# Patient Record
Sex: Female | Born: 1985 | Race: Black or African American | Hispanic: No | Marital: Single | Smoking: Current every day smoker
Health system: Southern US, Community
[De-identification: ages and names within clinical notes are randomized; demographics above are authoritative.]

## PROBLEM LIST (undated history)

## (undated) ENCOUNTER — Inpatient Hospital Stay (HOSPITAL_COMMUNITY): Payer: Self-pay

## (undated) DIAGNOSIS — R51 Headache: Secondary | ICD-10-CM

## (undated) DIAGNOSIS — D649 Anemia, unspecified: Secondary | ICD-10-CM

## (undated) DIAGNOSIS — N390 Urinary tract infection, site not specified: Secondary | ICD-10-CM

## (undated) HISTORY — PX: THERAPEUTIC ABORTION: SHX798

---
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
## 2007-03-21 ENCOUNTER — Encounter: Payer: Self-pay | Admitting: Obstetrics and Gynecology

## 2007-05-08 ENCOUNTER — Observation Stay: Payer: Self-pay

## 2007-05-11 ENCOUNTER — Observation Stay: Payer: Self-pay

## 2007-05-18 ENCOUNTER — Inpatient Hospital Stay: Payer: Self-pay | Admitting: Unknown Physician Specialty

## 2007-06-15 ENCOUNTER — Emergency Department: Payer: Self-pay | Admitting: Internal Medicine

## 2008-01-07 ENCOUNTER — Emergency Department (HOSPITAL_COMMUNITY): Admission: EM | Admit: 2008-01-07 | Discharge: 2008-01-07 | Payer: Self-pay | Admitting: Emergency Medicine

## 2008-02-16 ENCOUNTER — Inpatient Hospital Stay (HOSPITAL_COMMUNITY): Admission: AD | Admit: 2008-02-16 | Discharge: 2008-02-16 | Payer: Self-pay | Admitting: Gynecology

## 2008-05-26 ENCOUNTER — Inpatient Hospital Stay (HOSPITAL_COMMUNITY): Admission: AD | Admit: 2008-05-26 | Discharge: 2008-05-28 | Payer: Self-pay | Admitting: Obstetrics

## 2008-05-29 ENCOUNTER — Inpatient Hospital Stay (HOSPITAL_COMMUNITY): Admission: AD | Admit: 2008-05-29 | Discharge: 2008-05-29 | Payer: Self-pay | Admitting: Obstetrics

## 2008-06-22 ENCOUNTER — Inpatient Hospital Stay (HOSPITAL_COMMUNITY): Admission: AD | Admit: 2008-06-22 | Discharge: 2008-06-26 | Payer: Self-pay | Admitting: Obstetrics

## 2009-02-24 ENCOUNTER — Emergency Department (HOSPITAL_COMMUNITY): Admission: EM | Admit: 2009-02-24 | Discharge: 2009-02-24 | Payer: Self-pay | Admitting: Emergency Medicine

## 2009-09-10 ENCOUNTER — Emergency Department (HOSPITAL_COMMUNITY): Admission: EM | Admit: 2009-09-10 | Discharge: 2009-09-11 | Payer: Self-pay | Admitting: Emergency Medicine

## 2009-09-10 ENCOUNTER — Emergency Department: Payer: Self-pay | Admitting: Emergency Medicine

## 2009-09-16 ENCOUNTER — Emergency Department: Payer: Self-pay | Admitting: Emergency Medicine

## 2009-12-07 ENCOUNTER — Emergency Department (HOSPITAL_COMMUNITY): Admission: EM | Admit: 2009-12-07 | Discharge: 2009-12-07 | Payer: Self-pay | Admitting: Family Medicine

## 2010-06-05 ENCOUNTER — Inpatient Hospital Stay (HOSPITAL_COMMUNITY)
Admission: AD | Admit: 2010-06-05 | Discharge: 2010-06-06 | Payer: Self-pay | Source: Home / Self Care | Admitting: Obstetrics

## 2010-06-28 ENCOUNTER — Inpatient Hospital Stay (HOSPITAL_COMMUNITY)
Admission: AD | Admit: 2010-06-28 | Discharge: 2010-06-30 | Payer: Self-pay | Source: Home / Self Care | Attending: Obstetrics | Admitting: Obstetrics

## 2010-07-08 ENCOUNTER — Inpatient Hospital Stay (HOSPITAL_COMMUNITY)
Admission: AD | Admit: 2010-07-08 | Discharge: 2010-07-10 | Payer: Self-pay | Source: Home / Self Care | Attending: Obstetrics | Admitting: Obstetrics

## 2010-09-08 ENCOUNTER — Emergency Department (HOSPITAL_COMMUNITY): Payer: Medicaid Other

## 2010-09-08 ENCOUNTER — Emergency Department (HOSPITAL_COMMUNITY)
Admission: EM | Admit: 2010-09-08 | Discharge: 2010-09-08 | Disposition: A | Discharge status: Home / Self Care | Payer: Medicaid Other | Attending: Emergency Medicine | Admitting: Emergency Medicine

## 2010-09-08 DIAGNOSIS — R1031 Right lower quadrant pain: Secondary | ICD-10-CM | POA: Insufficient documentation

## 2010-09-08 DIAGNOSIS — N949 Unspecified condition associated with female genital organs and menstrual cycle: Secondary | ICD-10-CM | POA: Insufficient documentation

## 2010-09-08 DIAGNOSIS — R369 Urethral discharge, unspecified: Secondary | ICD-10-CM | POA: Insufficient documentation

## 2010-09-08 DIAGNOSIS — N760 Acute vaginitis: Secondary | ICD-10-CM | POA: Insufficient documentation

## 2010-09-08 DIAGNOSIS — A499 Bacterial infection, unspecified: Secondary | ICD-10-CM | POA: Insufficient documentation

## 2010-09-08 DIAGNOSIS — B9689 Other specified bacterial agents as the cause of diseases classified elsewhere: Secondary | ICD-10-CM | POA: Insufficient documentation

## 2010-09-08 DIAGNOSIS — M549 Dorsalgia, unspecified: Secondary | ICD-10-CM | POA: Insufficient documentation

## 2010-09-08 LAB — URINALYSIS, ROUTINE W REFLEX MICROSCOPIC
Bilirubin Urine: NEGATIVE
Ketones, ur: NEGATIVE mg/dL
Nitrite: NEGATIVE
Urobilinogen, UA: 0.2 mg/dL (ref 0.0–1.0)
pH: 6 (ref 5.0–8.0)

## 2010-09-08 LAB — URINALYSIS, MICROSCOPIC ONLY
Protein, ur: NEGATIVE mg/dL
Specific Gravity, Urine: 1.024 (ref 1.005–1.030)
Urine Glucose, Fasting: NEGATIVE mg/dL
pH: 6 (ref 5.0–8.0)

## 2010-09-08 LAB — COMPREHENSIVE METABOLIC PANEL
ALT: 15 U/L (ref 0–35)
BUN: 9 mg/dL (ref 6–23)
CO2: 24 mEq/L (ref 19–32)
Calcium: 9.5 mg/dL (ref 8.4–10.5)
Chloride: 103 mEq/L (ref 96–112)
GFR calc Af Amer: >60 mL/min (ref >60)
Glucose, Bld: 106 mg/dL — ABNORMAL HIGH (ref 70–99)
Potassium: 3.8 mEq/L (ref 3.5–5.1)

## 2010-09-08 LAB — CBC
MCH: 28.8 pg (ref 26.0–34.0)
RBC: 3.72 MIL/uL — ABNORMAL LOW (ref 3.87–5.11)
RDW: 13.6 % (ref 11.5–15.5)
WBC: 7.3 10*3/uL (ref 4.0–10.5)

## 2010-09-08 LAB — LACTIC ACID, PLASMA: Lactic Acid, Venous: 0.7 mmol/L (ref 0.5–2.2)

## 2010-09-08 LAB — DIFFERENTIAL
Lymphocytes Relative: 27 % (ref 12–46)
Lymphs Abs: 2 10*3/uL (ref 0.7–4.0)
Monocytes Absolute: 0.6 10*3/uL (ref 0.1–1.0)
Monocytes Relative: 9 % (ref 3–12)

## 2010-09-08 LAB — POCT PREGNANCY, URINE: Preg Test, Ur: NEGATIVE

## 2010-09-08 LAB — WET PREP, GENITAL: Yeast Wet Prep HPF POC: NONE SEEN

## 2010-09-11 LAB — URINE CULTURE
Colony Count: >=100000
Culture  Setup Time: 201203012358

## 2010-09-19 LAB — URINALYSIS, ROUTINE W REFLEX MICROSCOPIC
Bilirubin Urine: NEGATIVE
Glucose, UA: NEGATIVE mg/dL
Hgb urine dipstick: NEGATIVE
Protein, ur: NEGATIVE mg/dL
Specific Gravity, Urine: <1.005 — ABNORMAL LOW (ref 1.005–1.030)
pH: 7 (ref 5.0–8.0)

## 2010-09-19 LAB — URINE MICROSCOPIC-ADD ON

## 2010-09-19 LAB — CBC
HCT: 29.6 % — ABNORMAL LOW (ref 36.0–46.0)
Hemoglobin: 8.4 g/dL — ABNORMAL LOW (ref 12.0–15.0)
MCH: 30.4 pg (ref 26.0–34.0)
MCH: 30.5 pg (ref 26.0–34.0)
MCHC: 33.7 g/dL (ref 30.0–36.0)
MCHC: 34.5 g/dL (ref 30.0–36.0)
Platelets: 392 10*3/uL (ref 150–400)
RBC: 2.75 MIL/uL — ABNORMAL LOW (ref 3.87–5.11)

## 2010-09-20 LAB — URINE CULTURE
Colony Count: NO GROWTH
Culture  Setup Time: 201111281055
Culture: NO GROWTH

## 2010-09-20 LAB — URINALYSIS, ROUTINE W REFLEX MICROSCOPIC
Bilirubin Urine: NEGATIVE
Glucose, UA: NEGATIVE mg/dL
Specific Gravity, Urine: 1.01 (ref 1.005–1.030)
pH: 7 (ref 5.0–8.0)

## 2010-09-20 LAB — GC/CHLAMYDIA PROBE AMP, GENITAL: GC Probe Amp, Genital: NEGATIVE

## 2010-09-20 LAB — URINE MICROSCOPIC-ADD ON

## 2010-09-20 LAB — WET PREP, GENITAL: Trich, Wet Prep: NONE SEEN

## 2010-10-02 LAB — POCT I-STAT, CHEM 8
Calcium, Ion: 1.2 mmol/L (ref 1.12–1.32)
Chloride: 106 mEq/L (ref 96–112)
HCT: 28 % — ABNORMAL LOW (ref 36.0–46.0)
TCO2: 24 mmol/L (ref 0–100)

## 2010-10-02 LAB — URINALYSIS, ROUTINE W REFLEX MICROSCOPIC
Glucose, UA: NEGATIVE mg/dL
Protein, ur: 30 mg/dL — AB

## 2010-10-02 LAB — POCT PREGNANCY, URINE: Preg Test, Ur: NEGATIVE

## 2010-10-02 LAB — URINE MICROSCOPIC-ADD ON

## 2010-10-15 LAB — URINALYSIS, ROUTINE W REFLEX MICROSCOPIC
Bilirubin Urine: NEGATIVE
Ketones, ur: NEGATIVE mg/dL
Nitrite: NEGATIVE
Protein, ur: NEGATIVE mg/dL
Specific Gravity, Urine: 1.025 (ref 1.005–1.030)
Urobilinogen, UA: 0.2 mg/dL (ref 0.0–1.0)

## 2010-11-24 ENCOUNTER — Inpatient Hospital Stay (INDEPENDENT_AMBULATORY_CARE_PROVIDER_SITE_OTHER)
Admission: RE | Admit: 2010-11-24 | Discharge: 2010-11-24 | Disposition: A | Discharge status: Home / Self Care | Payer: Medicaid Other | Source: Ambulatory Visit | Attending: Family Medicine | Admitting: Family Medicine

## 2010-11-24 DIAGNOSIS — N390 Urinary tract infection, site not specified: Secondary | ICD-10-CM

## 2010-11-24 LAB — POCT URINALYSIS DIP (DEVICE)
Nitrite: POSITIVE — AB
Protein, ur: >=300 mg/dL — AB
Urobilinogen, UA: 1 mg/dL (ref 0.0–1.0)
pH: 5.5 (ref 5.0–8.0)

## 2010-11-25 LAB — URINE CULTURE
Colony Count: NO GROWTH
Culture: NO GROWTH

## 2011-04-06 LAB — CBC
HCT: 30.4 — ABNORMAL LOW
MCHC: 35.8
Platelets: 315
RDW: 12.9

## 2011-04-06 LAB — DIFFERENTIAL
Basophils Absolute: 0
Basophils Relative: 1
Eosinophils Absolute: 0.2
Eosinophils Relative: 3
Lymphocytes Relative: 17
Monocytes Absolute: 0.3

## 2011-04-06 LAB — WET PREP, GENITAL: Yeast Wet Prep HPF POC: NONE SEEN

## 2011-04-06 LAB — URINALYSIS, ROUTINE W REFLEX MICROSCOPIC
Bilirubin Urine: NEGATIVE
Glucose, UA: NEGATIVE
Ketones, ur: NEGATIVE
Specific Gravity, Urine: 1.028
pH: 7.5

## 2011-04-06 LAB — HCG, QUANTITATIVE, PREGNANCY: hCG, Beta Chain, Quant, S: 35208 — ABNORMAL HIGH

## 2011-04-06 LAB — POCT I-STAT, CHEM 8
Calcium, Ion: 1.16
Chloride: 105
Glucose, Bld: 95
HCT: 31 — ABNORMAL LOW
Hemoglobin: 10.5 — ABNORMAL LOW
TCO2: 21

## 2011-04-06 LAB — URINE MICROSCOPIC-ADD ON

## 2011-04-06 LAB — ABO/RH: ABO/RH(D): O POS

## 2011-04-07 LAB — URINALYSIS, ROUTINE W REFLEX MICROSCOPIC
Bilirubin Urine: NEGATIVE
Glucose, UA: NEGATIVE
Specific Gravity, Urine: 1.025
Urobilinogen, UA: 0.2
pH: 6.5

## 2011-04-07 LAB — RPR: RPR Ser Ql: NONREACTIVE

## 2011-04-07 LAB — URINE CULTURE

## 2011-04-07 LAB — CBC
Hemoglobin: 11.3 — ABNORMAL LOW
MCHC: 34.4
Platelets: 329
RDW: 12.5

## 2011-04-07 LAB — DIFFERENTIAL
Basophils Absolute: 0
Basophils Relative: 1
Monocytes Absolute: 0.9
Neutro Abs: 6.7

## 2011-04-07 LAB — URINE MICROSCOPIC-ADD ON

## 2011-04-07 LAB — WET PREP, GENITAL: Yeast Wet Prep HPF POC: NONE SEEN

## 2011-04-07 LAB — GC/CHLAMYDIA PROBE AMP, GENITAL: GC Probe Amp, Genital: NEGATIVE

## 2011-04-11 LAB — URINALYSIS, ROUTINE W REFLEX MICROSCOPIC
Glucose, UA: NEGATIVE
Hgb urine dipstick: NEGATIVE
Protein, ur: NEGATIVE
Specific Gravity, Urine: <1.005 — ABNORMAL LOW

## 2011-04-11 LAB — CBC
RBC: 2.85 — ABNORMAL LOW
WBC: 15 — ABNORMAL HIGH

## 2011-04-11 LAB — FETAL FIBRONECTIN: Fetal Fibronectin: NEGATIVE

## 2011-04-11 LAB — URINE MICROSCOPIC-ADD ON

## 2011-04-14 LAB — COMPREHENSIVE METABOLIC PANEL
Albumin: 3 g/dL — ABNORMAL LOW (ref 3.5–5.2)
Alkaline Phosphatase: 127 U/L — ABNORMAL HIGH (ref 39–117)
BUN: 5 mg/dL — ABNORMAL LOW (ref 6–23)
Chloride: 104 mEq/L (ref 96–112)
Glucose, Bld: 85 mg/dL (ref 70–99)
Potassium: 3.4 mEq/L — ABNORMAL LOW (ref 3.5–5.1)
Total Bilirubin: 0.7 mg/dL (ref 0.3–1.2)

## 2011-04-14 LAB — CBC
HCT: 29.2 % — ABNORMAL LOW (ref 36.0–46.0)
HCT: 31.5 % — ABNORMAL LOW (ref 36.0–46.0)
Hemoglobin: 10 g/dL — ABNORMAL LOW (ref 12.0–15.0)
Hemoglobin: 10.7 g/dL — ABNORMAL LOW (ref 12.0–15.0)
MCV: 100.1 fL — ABNORMAL HIGH (ref 78.0–100.0)
Platelets: 270 10*3/uL (ref 150–400)
WBC: 13.7 10*3/uL — ABNORMAL HIGH (ref 4.0–10.5)
WBC: 17 10*3/uL — ABNORMAL HIGH (ref 4.0–10.5)

## 2011-04-14 LAB — URINE MICROSCOPIC-ADD ON

## 2011-04-14 LAB — URINALYSIS, ROUTINE W REFLEX MICROSCOPIC
Hgb urine dipstick: NEGATIVE
Ketones, ur: 15 mg/dL — AB
Protein, ur: NEGATIVE mg/dL
Urobilinogen, UA: 0.2 mg/dL (ref 0.0–1.0)

## 2011-07-11 NOTE — L&D Delivery Note (Signed)
I agree with the above note regarding delivery.   Patient seen approximately 2 hrs after delivery on Postpartum Floor for passage of clots.  Estimated 500 cc clots expelled. Bimanual exam shows no residual clots of significance, and uterus firm at u-2.  Oxytocin 20  Units in 1000 cc LR administered with no further bleeding.  CBC in AM ordered

## 2011-07-11 NOTE — L&D Delivery Note (Signed)
Delivery Note At 3:22 PM a viable female was delivered via Vaginal, Spontaneous Delivery (Presentation: ROA).  APGAR: 9, 10; weight 6 lb 6.8 oz (2914 g).   Placenta status: spontaneous delivery, intact.  Cord: 3 vessels with the following complications: None.  Cord pH: not collected Wynelle Bourgeois CNM was present and helped with the delivery  Anesthesia: Epidural  Episiotomy: None Lacerations: None Est. Blood Loss (mL): 100  Mom to postpartum.  Baby to nursery-stable.  Corky Downs 06/14/2012, 3:42 PM

## 2011-11-29 ENCOUNTER — Inpatient Hospital Stay (HOSPITAL_COMMUNITY): Payer: Self-pay

## 2011-11-29 ENCOUNTER — Inpatient Hospital Stay (HOSPITAL_COMMUNITY)
Admission: AD | Admit: 2011-11-29 | Discharge: 2011-11-29 | Disposition: A | Discharge status: Home / Self Care | Payer: Self-pay | Source: Ambulatory Visit | Attending: Obstetrics & Gynecology | Admitting: Obstetrics & Gynecology

## 2011-11-29 ENCOUNTER — Encounter (HOSPITAL_COMMUNITY): Payer: Self-pay | Admitting: *Deleted

## 2011-11-29 DIAGNOSIS — N939 Abnormal uterine and vaginal bleeding, unspecified: Secondary | ICD-10-CM

## 2011-11-29 DIAGNOSIS — N898 Other specified noninflammatory disorders of vagina: Secondary | ICD-10-CM

## 2011-11-29 DIAGNOSIS — R1031 Right lower quadrant pain: Secondary | ICD-10-CM | POA: Insufficient documentation

## 2011-11-29 DIAGNOSIS — O2390 Unspecified genitourinary tract infection in pregnancy, unspecified trimester: Secondary | ICD-10-CM | POA: Insufficient documentation

## 2011-11-29 DIAGNOSIS — N390 Urinary tract infection, site not specified: Secondary | ICD-10-CM | POA: Insufficient documentation

## 2011-11-29 DIAGNOSIS — O209 Hemorrhage in early pregnancy, unspecified: Secondary | ICD-10-CM | POA: Insufficient documentation

## 2011-11-29 LAB — URINALYSIS, ROUTINE W REFLEX MICROSCOPIC
Bilirubin Urine: NEGATIVE
Glucose, UA: NEGATIVE mg/dL
Specific Gravity, Urine: 1.025 (ref 1.005–1.030)
Urobilinogen, UA: 0.2 mg/dL (ref 0.0–1.0)
pH: 6 (ref 5.0–8.0)

## 2011-11-29 LAB — WET PREP, GENITAL: Yeast Wet Prep HPF POC: NONE SEEN

## 2011-11-29 LAB — URINE MICROSCOPIC-ADD ON

## 2011-11-29 LAB — POCT PREGNANCY, URINE: Preg Test, Ur: POSITIVE — AB

## 2011-11-29 MED ORDER — CEPHALEXIN 500 MG PO CAPS: 500.0000 mg | ORAL_CAPSULE | Freq: Four times a day (QID) | ORAL | Status: AC

## 2011-11-29 MED ORDER — CEPHALEXIN 500 MG PO CAPS: 500.0000 mg | ORAL_CAPSULE | Freq: Four times a day (QID) | ORAL | Status: DC

## 2011-11-29 NOTE — MAU Note (Signed)
Pt in c/o abdominal cramping x4 days.  States this morning she had some pink-tinged spotting with wiping x1 episode.  LMP 09/22/11.

## 2011-11-29 NOTE — MAU Provider Note (Signed)
History     CSN: [id number]  Arrival date and time: 11/29/11 4098   First Provider Initiated Contact with Patient 11/29/11 0920      Chief Complaint  Patient presents with  . Possible Pregnancy   HPI Comments: Patient  Is  26 y/o [id number] AAF c/o RLQ pain with pink discharge on the toilet tissue this AM.Onset of pain was 5 days ago and she noticed d/c this AM. Pain is Described as sharp in natures that occasionally radiates to her buttocks or vagina. Worse with ambulation. Resolves with lying flat.  LMP 09/22/11. Last BM was 5 days ago. Little to no water consumption daily- mostly drinks soda.   Abdominal Cramping The pain is located in the RLQ. Associated symptoms include headaches, nausea and vomiting. Pertinent negatives include no diarrhea, fever or melena.  Patient is a 26 y.o. female presenting with cramps.  Abdominal Cramping The primary symptoms of the illness include abdominal pain, nausea and vomiting. The primary symptoms of the illness do not include fever or diarrhea.  Symptoms associated with the illness do not include chills or heartburn.    OB History    Grav Para Term Preterm Abortions TAB SAB Ect Mult Living   6 4 2 2 1 1  0 0 0 4      Past Medical History  Diagnosis Date  . No pertinent past medical history     Past Surgical History  Procedure Date  . No past surgeries     History reviewed. No pertinent family history.  History  Substance Use Topics  . Smoking status: Never Smoker   . Smokeless tobacco: Not on file  . Alcohol Use: No    Allergies: No Known Allergies  No prescriptions prior to admission    Review of Systems  Constitutional: Negative for fever and chills.  HENT:       1 week ago. Took Excedrin that made her nauseous. Resolved.  Respiratory: Negative.   Cardiovascular: Negative.   Gastrointestinal: Positive for nausea, vomiting and abdominal pain. Negative for heartburn, diarrhea, blood in stool and melena.  Genitourinary:  Negative.   Musculoskeletal: Negative.   Neurological: Positive for headaches.   Physical Exam   Blood pressure 122/68, pulse 86, temperature 98.4 F (36.9 C), temperature source Oral, resp. rate 16, height 5\' 6"  (1.676 m), weight 64.592 kg (142 lb 6.4 oz), last menstrual period 09/22/2011, SpO2 100.00%.  Physical Exam  Constitutional: She is oriented to person, place, and time. She appears well-developed and well-nourished. No distress.  HENT:  Head: Normocephalic and atraumatic.  Cardiovascular: Normal rate, regular rhythm, S1 normal, S2 normal, normal heart sounds and intact distal pulses.  Exam reveals no gallop and no friction rub.   No murmur heard. Respiratory: Effort normal and breath sounds normal. No respiratory distress. She has no wheezes. She has no rales. She exhibits no tenderness.  GI: Soft. Normal appearance and bowel sounds are normal. She exhibits distension and mass. There is no hepatosplenomegaly, splenomegaly or hepatomegaly. There is no tenderness. There is no rigidity, no rebound, no guarding, no tenderness at McBurney's point and negative Murphy's sign. No hernia. Hernia confirmed negative in the ventral area.       D/t pregnancy FHT of 160bpm via doppler  Genitourinary: Vagina normal and uterus normal. There is no rash, tenderness, lesion or injury on the right labia. There is no rash, tenderness, lesion or injury on the left labia. No vaginal discharge found.       Cervix closed.  No tenderness on bimanual exam.  Neurological: She is alert and oriented to person, place, and time.  Skin: Skin is warm and dry. No rash noted. She is not diaphoretic. No erythema. No pallor.    MAU Course  Procedures Complete abdominal US  Pelvic exam  FHT   MDM   UTI  Normal 1st trimester spotting Hematuria  Contractions secondary to dehydration  STI      Assessment and Plan  Awaiting [place]  Reassured patient with FHT via doppler.  Encouraged to drink more clear  fluids Keflex 500mg  QID  #28  for UTI  Use as directed Regular strength tylenol for pain   Assessment: Subchorionic Hemorrhage UTI   I agree with the exam and documentation.  Nolene Bernheim, RN, FNP  Kathreen Cornfield, Jared 11/29/2011, 9:36 AM

## 2011-11-29 NOTE — Discharge Instructions (Signed)
Urinary Tract Infection in Pregnancy A urinary tract infection (UTI) is a bacterial infection of the urinary tract. Infection of the urinary tract can include the ureters, kidneys (pyelonephritis), bladder (cystitis), and urethra (urethritis). All pregnant women should be screened for bacteria in the urinary tract. Identifying and treating a UTI will decrease the risk of preterm labor and developing more serious infections in both the mother and baby. CAUSES Bacteria germs cause almost all UTIs. There are many factors that can increase your chances of getting a UTI during pregnancy. These include:  Having a short urethra.   Poor toilet and hygiene habits.   Sexual intercourse.   Blockage of urine along the urinary tract.   Problems with the pelvic muscles or nerves.   Diabetes.   Obesity.   Bladder problems after having several children.   Previous history of UTI.  SYMPTOMS   Pain, burning, or a stinging feeling when urinating.   Suddenly feeling the need to urinate right away (urgency).   Loss of bladder control (urinary incontinence).   Frequent urination, more than is common with pregnancy.   Lower abdominal or back discomfort.   Bad smelling urine.   Cloudy urine.   Blood in the urine (hematuria).   Fever.  When the kidneys are infected, the symptoms may be:  Back pain.   Flank pain on the right side more so than the left.   Fever.   Chills.   Nausea.   Vomiting.  DIAGNOSIS   Urine tests.   Additional tests and procedures may include:   Ultrasound of the kidneys, ureters, bladder, and urethra.   Looking in the bladder with a lighted tube (cystoscopy).   Certain X-ray studies only when absolutely necessary.  Finding out the results of your test Ask when your test results will be ready. Make sure you get your test results. TREATMENT  Antibiotic medicine by mouth.   Antibiotics given through the vein (intravenously), if needed.  HOME CARE  INSTRUCTIONS   Take your antibiotics as directed. Finish them even if you start to feel better. Only take medicine as directed by your caregiver.   Drink enough fluids to keep your urine clear or pale yellow.   Do not have sexual intercourse until the infection is gone and your caregiver says it is okay.   Make sure you are tested for UTIs throughout your pregnancy if you get one. These infections often come back.  Preventing a UTI in the future:  Practice good toilet habits. Always wipe from front to back. Use the tissue only once.   Do not hold your urine. Empty your bladder as soon as possible when the urge comes.   Do not douche or use deodorant sprays.   Wash with soap and warm water around the genital area and the anus.   Empty your bladder before and after sexual intercourse.   Wear underwear with a cotton crotch.   Avoid caffeine and carbonated drinks. They can irritate the bladder.   Drink cranberry juice or take cranberry pills. This may decrease the risk of getting a UTI.   Do not drink alcohol.   Keep all your appointments and tests as scheduled.  SEEK MEDICAL CARE IF:   Your symptoms get worse.   You are still having fevers 2 or more days after treatment begins.   You develop a rash.   You feel that you are having problems with medicines prescribed.   You develop abnormal vaginal discharge.  SEEK IMMEDIATE MEDICAL   CARE IF:   You develop back or flank pain.   You develop chills.   You have blood in your urine.   You develop nausea and vomiting.   You develop contractions of your uterus.   You have a gush of fluid from the vagina.  MAKE SURE YOU:   Understand these instructions.   Will watch your condition.   Will get help right away if you are not doing well or get worse.  Document Released: 10/21/2010 Document Revised: 06/15/2011 Document Reviewed: 10/21/2010   Document Released: 04/23/2009 Document Revised: 06/15/2011 Document Reviewed:  04/23/2009 [facility] Patient Information 2012 [place], [place].Vaginal Bleeding During Pregnancy, First Trimester A small amount of bleeding (spotting) is relatively common in early pregnancy. It usually stops on its own. There are many causes for bleeding or spotting in early pregnancy. Some bleeding may be related to the pregnancy and some may not. Cramping with the bleeding is more serious and concerning. Tell your caregiver if you have any vaginal bleeding.  CAUSES  It is normal in most cases.  The pregnancy ends (miscarriage).  The pregnancy may end (threatened miscarriage).  Infection or inflammation of the cervix.  Growths (polyps) on the cervix.  Pregnancy happens outside of the uterus and in a fallopian tube (tubal pregnancy).  Many tiny cysts in the uterus instead of pregnancy tissue (molar pregnancy).  SYMPTOMS  Vaginal bleeding or spotting with or without cramps. DIAGNOSIS  To evaluate the pregnancy, your caregiver may: Do a pelvic exam.  Take blood tests.  Do an ultrasound.  It is very important to follow your caregiver's instructions.  TREATMENT  Evaluation of the pregnancy with blood tests and ultrasound.  Bed rest (getting up to use the bathroom only).  Rho-gam immunization if the mother is Rh negative and the father is Rh positive.  HOME CARE INSTRUCTIONS  If your caregiver orders bed rest, you may need to make arrangements for the care of other children and for other responsibilities. However, your caregiver may allow you to continue light activity.  Keep track of the number of pads you use each day, how often you change pads and how soaked (saturated) they are. Write this down.  Do not use tampons. Do not douche.  Do not have sexual intercourse or orgasms until approved by your physician.  Save any tissue that you pass for your caregiver to see.  Take medicine for cramps only with your caregiver's permission.  Do not take aspirin because it can make you bleed.  SEEK  IMMEDIATE MEDICAL CARE IF:  You experience severe cramps in your stomach, back or belly (abdomen).  You have an oral temperature above 102 F (38.9 C), not controlled by medicine.  You pass large clots or tissue.  Your bleeding increases or you become light-headed, weak or have fainting episodes.  You develop chills.  You are leaking or have a gush of fluid from your vagina.  You pass out while having a bowel movement. That may mean you have a ruptured tubal pregnancy.  Document Released: 04/05/2005 Document Revised: 06/15/2011 Document Reviewed: 10/15/2008 [facility] Patient Information 2012 [place], [place].

## 2011-11-29 NOTE — MAU Note (Signed)
Patient states she had a positive pregnancy test at home bout 3 weeks ago. Had pinkish/red spotting on tissue this morning after urinating. Not wearing a pad and no free bleeding. States she has had cramping for about 4 days.

## 2011-11-30 LAB — GC/CHLAMYDIA PROBE AMP, GENITAL: Chlamydia, DNA Probe: NEGATIVE

## 2012-03-01 ENCOUNTER — Inpatient Hospital Stay (HOSPITAL_COMMUNITY): Payer: Medicaid Other

## 2012-03-01 ENCOUNTER — Encounter (HOSPITAL_COMMUNITY): Payer: Self-pay | Admitting: *Deleted

## 2012-03-01 ENCOUNTER — Inpatient Hospital Stay (HOSPITAL_COMMUNITY)
Admission: AD | Admit: 2012-03-01 | Discharge: 2012-03-01 | Disposition: A | Discharge status: Home / Self Care | Payer: Medicaid Other | Source: Ambulatory Visit | Attending: Obstetrics & Gynecology | Admitting: Obstetrics & Gynecology

## 2012-03-01 DIAGNOSIS — O0930 Supervision of pregnancy with insufficient antenatal care, unspecified trimester: Secondary | ICD-10-CM | POA: Insufficient documentation

## 2012-03-01 DIAGNOSIS — O209 Hemorrhage in early pregnancy, unspecified: Secondary | ICD-10-CM | POA: Insufficient documentation

## 2012-03-01 DIAGNOSIS — Z3689 Encounter for other specified antenatal screening: Secondary | ICD-10-CM

## 2012-03-01 DIAGNOSIS — O26879 Cervical shortening, unspecified trimester: Secondary | ICD-10-CM | POA: Insufficient documentation

## 2012-03-01 LAB — URINALYSIS, ROUTINE W REFLEX MICROSCOPIC
Glucose, UA: NEGATIVE mg/dL
Nitrite: NEGATIVE
Specific Gravity, Urine: 1.01 (ref 1.005–1.030)
pH: 6.5 (ref 5.0–8.0)

## 2012-03-01 LAB — WET PREP, GENITAL
Clue Cells Wet Prep HPF POC: NONE SEEN
Trich, Wet Prep: NONE SEEN

## 2012-03-01 LAB — URINE MICROSCOPIC-ADD ON

## 2012-03-01 MED ORDER — HYDROXYPROGESTERONE CAPROATE 250 MG/ML IM OIL
250.0000 mg | TOPICAL_OIL | Freq: Once | INTRAMUSCULAR | Status: AC
  Administered 2012-03-01: 250 mg via INTRAMUSCULAR
  Filled 2012-03-01: qty 1

## 2012-03-01 NOTE — MAU Provider Note (Signed)
History   Tonya David is a 26 y.o. [phone] female at [redacted]w[redacted]d by LMP that correlates well w/ 1st trimester u/s, presenting with report of sharp shooting intermittent suprapubic pain that began 3-4 days ago, also intermittent pink spotting when wiping x 3 weeks.  Had 'big gush' of bright red blood 1.5 months ago after coughing.  H/o many uti's per pt w/ last being 3 months ago, but doesn't feel like a uti to her.  Has not initiated pnc, awaiting medicaid card then will be seeing dr. Gaynell Face.  Reports good fm. Denies uc's or lof.  Had u/s on 11/29/11 at 9wks 5days that showed SIUP w/ small subchorionic hemorrhage.  H/O PTB x 3 at 34-36 weeks, requiring magnesium during last 2 pregnancies. Has never received 17-P.    CSN: [id number]  Arrival date and time: 03/01/12 1017   First Provider Initiated Contact with Patient 03/01/12 1106      Chief Complaint  Patient presents with  . Abdominal Pain   Abdominal Pain This is a new problem. The current episode started in the past 7 days. The onset quality is gradual. The problem occurs intermittently. The problem has been unchanged. The pain is located in the suprapubic region. The pain is at a severity of 6/10. The pain is moderate. The quality of the pain is sharp. The abdominal pain does not radiate. Associated symptoms include frequency and headaches (occasional ). Pertinent negatives include no diarrhea, dysuria, fever, hematuria, nausea or vomiting. Nothing aggravates the pain. The pain is relieved by nothing. She has tried nothing for the symptoms. h/o many uti's, last one app 3 months ago    OB History    Grav Para Term Preterm Abortions TAB SAB Ect Mult Living   6 4 2 2 1 1  0 0 0 4      Past Medical History  Diagnosis Date  . No pertinent past medical history     Past Surgical History  Procedure Date  . No past surgeries     History reviewed. No pertinent family history.  History  Substance Use Topics  . Smoking status: Never  Smoker   . Smokeless tobacco: Not on file  . Alcohol Use: No    Allergies: No Known Allergies  No prescriptions prior to admission    Review of Systems  Constitutional: Negative.  Negative for fever.  Eyes: Negative.   Respiratory: Positive for cough, shortness of breath (currently has uri per pt) and wheezing. Negative for sputum production.        URI x 2 days- Took children's tylenol and mucinex without relief   Cardiovascular: Negative.   Gastrointestinal: Positive for abdominal pain. Negative for nausea, vomiting and diarrhea.  Genitourinary: Positive for frequency. Negative for dysuria, urgency and hematuria.       Pink spotting intermittently when wiping x 2 weeks; Had 'big gush' of bright red vaginal blood after coughing app 1.5 months ago Increased vag d/c w/o odor  Musculoskeletal: Negative.   Skin: Negative.   Neurological: Positive for headaches (occasional ). Negative for dizziness.  Endo/Heme/Allergies: Negative.   Psychiatric/Behavioral: Negative.    Physical Exam   Blood pressure 120/62, pulse 101, temperature 97.8 F (36.6 C), temperature source Oral, resp. rate 16, height 5' 5.5" (1.664 m), weight 67.699 kg (149 lb 4 oz), last menstrual period 09/22/2011.  Physical Exam  Constitutional: She is oriented to person, place, and time. She appears well-developed and well-nourished.  HENT:  Head: Normocephalic.  Eyes: Pupils are equal,  round, and reactive to light.  Neck: Normal range of motion.  Cardiovascular: Normal rate.   Respiratory: Effort normal and breath sounds normal.  GI: Soft. Bowel sounds are normal. She exhibits no distension. There is no tenderness.  Genitourinary: Vaginal discharge found.       Spec exam: cervix visually closed, no vag bleeding or lof noted.  Mod/large amount white homogenous non-odorous d/c noted.    SVE: 1/60/-2, somewhat firm   Musculoskeletal: Normal range of motion.  Neurological: She is alert and oriented to person,  place, and time. She has normal reflexes.  Skin: Skin is warm and dry.  Psychiatric: She has a normal mood and affect. Her behavior is normal. Judgment and thought content normal.   FHR: 156 via doppler UCs: occasional mild uc's, not perceived by pt  MAU Course  Procedures UA Limited u/s to assess placenta: no previa or abruption, cx=2.1cm w/ minimal funneling Spec exam w/ wet prep, GC/CT, and GBS collected SVE: 1/60/-2, somewhat firm  Results for orders placed during the hospital encounter of 03/01/12 (from the past 24 hour(s))  URINALYSIS, ROUTINE W REFLEX MICROSCOPIC     Status: Abnormal   Collection Time   03/01/12 10:45 AM      Component Value Range   Color, Urine YELLOW  YELLOW   APPearance HAZY (*) CLEAR   Specific Gravity, Urine 1.010  1.005 - 1.030   pH 6.5  5.0 - 8.0   Glucose, UA NEGATIVE  NEGATIVE mg/dL   Hgb urine dipstick NEGATIVE  NEGATIVE   Bilirubin Urine NEGATIVE  NEGATIVE   Ketones, ur NEGATIVE  NEGATIVE mg/dL   Protein, ur NEGATIVE  NEGATIVE mg/dL   Urobilinogen, UA 0.2  0.0 - 1.0 mg/dL   Nitrite NEGATIVE  NEGATIVE   Leukocytes, UA TRACE (*) NEGATIVE  URINE MICROSCOPIC-ADD ON     Status: Abnormal   Collection Time   03/01/12 10:45 AM      Component Value Range   Squamous Epithelial / LPF FEW (*) RARE   WBC, UA 3-6  <3 WBC/hpf   RBC / HPF 0-2  <3 RBC/hpf   Bacteria, UA MANY (*) RARE  WET PREP, GENITAL     Status: Abnormal   Collection Time   03/01/12  1:36 PM      Component Value Range   Yeast Wet Prep HPF POC NONE SEEN  NONE SEEN   Trich, Wet Prep NONE SEEN  NONE SEEN   Clue Cells Wet Prep HPF POC NONE SEEN  NONE SEEN   WBC, Wet Prep HPF POC FEW (*) NONE SEEN     There are no discharge medications for this patient.   Follow-up Information    Follow up with [facility]. (They will call you with an appointment.  Return to hospital  as needed or  if symptoms worsen)    Contact information:   [address] [place]  [place] [address]          1355: Reviewed case w/ Dr. Erin Fulling, to d/c and f/u in [facility] next week, repeat u/s to assess cervical length on Monday or Tuesday, and initiate 17-P today.   Assessment and Plan  A:  [redacted]w[redacted]d SIUP  No PNC  2nd trimester bleeding with abdominal pain  Shortened cervix @ 2.1cm w/ minimal funneling  H/O PTB x 3 @ 34-36wks  P:  D/C home  [facility] to call pt w/ appt for next week to initiate care  Anatomy u/s & repeat CL  on Mon 8/26 @ 3pm  17-P weekly, first injection today  Pelvic rest    Cheral Marker, CNM, WHNP-BC 03/01/2012, 11:07 AM

## 2012-03-01 NOTE — MAU Note (Signed)
Lower abdominal pain that started 4days ago.  Pt describes pain an intermittent sharp pain.  She also c/o some pink discharge noted when wiping after voiding, that started two weeks ago.

## 2012-03-01 NOTE — MAU Note (Signed)
Pt states intermittent spotting x2 weeks, none present now, however notes lower abdominal pain that began 3 days ago. Denies abnormal vaginal discharge. Denies uti s/s.

## 2012-03-01 NOTE — MAU Provider Note (Signed)
Attestation of Attending Supervision of Advanced Practitioner (CNM/NP): Evaluation and management procedures were performed by the Advanced Practitioner under my supervision and collaboration.  I have reviewed the Advanced Practitioner's note and chart, and have discussed the pts care with them in detail and I agree with the management and plan.  HARRAWAY-SMITH, Loukas Antonson 3:35 PM

## 2012-03-03 LAB — URINE CULTURE: Colony Count: 85000

## 2012-03-04 ENCOUNTER — Other Ambulatory Visit (HOSPITAL_COMMUNITY): Payer: Self-pay | Admitting: Physician Assistant

## 2012-03-04 ENCOUNTER — Encounter: Payer: Self-pay | Admitting: *Deleted

## 2012-03-04 ENCOUNTER — Ambulatory Visit (HOSPITAL_COMMUNITY)
Admission: RE | Admit: 2012-03-04 | Discharge: 2012-03-04 | Disposition: A | Discharge status: Home / Self Care | Payer: Medicaid Other | Source: Ambulatory Visit | Attending: Physician Assistant | Admitting: Physician Assistant

## 2012-03-04 DIAGNOSIS — Z8751 Personal history of pre-term labor: Secondary | ICD-10-CM | POA: Insufficient documentation

## 2012-03-04 DIAGNOSIS — Z1389 Encounter for screening for other disorder: Secondary | ICD-10-CM | POA: Insufficient documentation

## 2012-03-04 DIAGNOSIS — Z3689 Encounter for other specified antenatal screening: Secondary | ICD-10-CM

## 2012-03-04 DIAGNOSIS — Z363 Encounter for antenatal screening for malformations: Secondary | ICD-10-CM | POA: Insufficient documentation

## 2012-03-04 DIAGNOSIS — O358XX0 Maternal care for other (suspected) fetal abnormality and damage, not applicable or unspecified: Secondary | ICD-10-CM | POA: Insufficient documentation

## 2012-03-14 ENCOUNTER — Encounter: Payer: Self-pay | Admitting: Obstetrics and Gynecology

## 2012-03-14 ENCOUNTER — Ambulatory Visit (INDEPENDENT_AMBULATORY_CARE_PROVIDER_SITE_OTHER): Payer: Medicaid Other | Admitting: Obstetrics and Gynecology

## 2012-03-14 ENCOUNTER — Other Ambulatory Visit (HOSPITAL_COMMUNITY)
Admission: RE | Admit: 2012-03-14 | Discharge: 2012-03-14 | Disposition: A | Discharge status: Home / Self Care | Payer: Medicaid Other | Source: Ambulatory Visit | Attending: Obstetrics and Gynecology | Admitting: Obstetrics and Gynecology

## 2012-03-14 VITALS — BP 110/65 | Temp 98.3°F | Wt 150.9 lb

## 2012-03-14 DIAGNOSIS — Z8751 Personal history of pre-term labor: Secondary | ICD-10-CM | POA: Insufficient documentation

## 2012-03-14 DIAGNOSIS — O09899 Supervision of other high risk pregnancies, unspecified trimester: Secondary | ICD-10-CM

## 2012-03-14 DIAGNOSIS — Z113 Encounter for screening for infections with a predominantly sexual mode of transmission: Secondary | ICD-10-CM | POA: Insufficient documentation

## 2012-03-14 DIAGNOSIS — O09219 Supervision of pregnancy with history of pre-term labor, unspecified trimester: Secondary | ICD-10-CM

## 2012-03-14 DIAGNOSIS — O26879 Cervical shortening, unspecified trimester: Secondary | ICD-10-CM

## 2012-03-14 DIAGNOSIS — Z01419 Encounter for gynecological examination (general) (routine) without abnormal findings: Secondary | ICD-10-CM | POA: Insufficient documentation

## 2012-03-14 DIAGNOSIS — O0992 Supervision of high risk pregnancy, unspecified, second trimester: Secondary | ICD-10-CM

## 2012-03-14 LAB — POCT URINALYSIS DIP (DEVICE)
Bilirubin Urine: NEGATIVE
Glucose, UA: NEGATIVE mg/dL
Hgb urine dipstick: NEGATIVE
Ketones, ur: NEGATIVE mg/dL
Nitrite: NEGATIVE
Specific Gravity, Urine: 1.02 (ref 1.005–1.030)
pH: 7 (ref 5.0–8.0)

## 2012-03-14 MED ORDER — HYDROXYPROGESTERONE CAPROATE 250 MG/ML IM OIL
250.0000 mg | TOPICAL_OIL | INTRAMUSCULAR | Status: DC
  Administered 2012-03-14: 250 mg via INTRAMUSCULAR

## 2012-03-14 NOTE — Progress Notes (Signed)
   Subjective:    DANEL STUDZINSKI is a [id number] [redacted]w[redacted]d being seen today for her first obstetrical visit.  Her obstetrical history is significant for PTB x3 34-36 wks. Has started weekly 17P. Has short cx with funneling. Patient does not intend to breast feed. Pregnancy history fully reviewed.  Patient reports no complaints.  Filed Vitals:   03/14/12 0949  BP: 110/65  Temp: 98.3 F (36.8 C)  Weight: 150 lb 14.4 oz (68.448 kg)    HISTORY: OB History    Grav Para Term Preterm Abortions TAB SAB Ect Mult Living   6 4 1 3 1 1  0 0 0 4     # Outc Date GA Lbr Len/2nd Wgt Sex Del Anes PTL Lv   1 TRM 2007 [redacted]w[redacted]d   M SVD  No Yes   2 PRE 2008 [redacted]w[redacted]d   M SVD  Yes Yes   3 PRE 2009 [redacted]w[redacted]d   F SVD  Yes Yes   4 PRE 2011 [redacted]w[redacted]d   F SVD  Yes Yes   5 TAB            6 CUR              Past Medical History  Diagnosis Date  . No pertinent past medical history    Past Surgical History  Procedure Date  . No past surgeries    History reviewed. No pertinent family history.   Exam    Uterus:   S=D  Pelvic Exam:    Perineum: Normal Perineum   Vulva: normal   Vagina:  normal mucosa, normal discharge       Cervix: soft post ft/50/-3   Adnexa: not evaluated   Bony Pelvis: gynecoid  System: Breast:  normal appearance, no masses or tenderness   Skin: normal coloration and turgor, no rashes    Neurologic: oriented, normal mood, no focal deficits   Extremities: normal strength, tone, and muscle mass   HEENT PERRLA thyroid not enlarged   Mouth/Teeth mucous membranes moist, pharynx normal without lesions and dental hygiene good   Neck supple and no masses   Cardiovascular: regular rate and rhythm   Respiratory:  appears well, vitals normal, no respiratory distress, acyanotic, normal RR, ear and throat exam is normal, neck free of mass or lymphadenopathy, chest clear, no wheezing, crepitations, rhonchi, normal symmetric air entry   Abdomen: soft, S=D   Urinary: urethral meatus normal  Anatomy  normal female    Assessment:    Pregnancy: [id number] Patient Active Problem List  Diagnosis  . Previous preterm delivery, antepartum  Short cx      Plan:     Initial labs drawn., reviewed. 17P given Prenatal vitamins. Problem list reviewed and updated.  Follow up in 4 weeks. 50% of30 30 min visit spent on counseling and coordination of care.  PTL precautions   Savannah Morford 03/14/2012

## 2012-03-14 NOTE — Progress Notes (Signed)
P = 98 Pressure in lower pelvic area

## 2012-03-14 NOTE — Patient Instructions (Signed)

## 2012-03-14 NOTE — Progress Notes (Signed)
Nutrition note: 1st visit consult Pt has hx of PTB x 3. Pt has gained 25.9# @[redacted]w[redacted]d , which is > expected so far. Pt reports eating 4 meals & 6 snacks/ d, drinks pepsi & caffeine-free soda daily & water some days.  Pt is not taking PNV. Pt reports no N/V but does have heartburn occ. Pt stated she does not plan to BF but was open to education. Pt given verbal & written education on general diet during pregnancy & importance of PNV. Encouraged BF, less soda and more water. Disc tips to decrease heartburn. Also encouraged decreasing number of snacks & include protein with meals & snacks. Pt agrees to take PNV daily & at least try BF in hospital.  Disc wt gain goals of 25-35#. Pt does not have WIC currently but plans to apply. F/u if referred. Blondell Reveal, MS, RD, LDN

## 2012-03-15 LAB — WET PREP, GENITAL: Yeast Wet Prep HPF POC: NONE SEEN

## 2012-03-15 LAB — OBSTETRIC PANEL
Antibody Screen: NEGATIVE
Eosinophils Absolute: 0.2 10*3/uL (ref 0.0–0.7)
HCT: 31.7 % — ABNORMAL LOW (ref 36.0–46.0)
Hemoglobin: 11.1 g/dL — ABNORMAL LOW (ref 12.0–15.0)
Hepatitis B Surface Ag: NEGATIVE
Lymphs Abs: 2.1 10*3/uL (ref 0.7–4.0)
MCH: 31.5 pg (ref 26.0–34.0)
Monocytes Absolute: 1.4 10*3/uL — ABNORMAL HIGH (ref 0.1–1.0)
Monocytes Relative: 12 % (ref 3–12)
Neutrophils Relative %: 69 % (ref 43–77)
RBC: 3.52 MIL/uL — ABNORMAL LOW (ref 3.87–5.11)
Rh Type: POSITIVE

## 2012-04-25 ENCOUNTER — Ambulatory Visit (INDEPENDENT_AMBULATORY_CARE_PROVIDER_SITE_OTHER): Payer: Medicaid Other | Admitting: Advanced Practice Midwife

## 2012-04-25 VITALS — BP 116/67 | Temp 98.4°F | Wt 159.7 lb

## 2012-04-25 DIAGNOSIS — O26879 Cervical shortening, unspecified trimester: Secondary | ICD-10-CM

## 2012-04-25 LAB — POCT URINALYSIS DIP (DEVICE)
Ketones, ur: NEGATIVE mg/dL
Protein, ur: NEGATIVE mg/dL
Specific Gravity, Urine: 1.02 (ref 1.005–1.030)

## 2012-04-25 MED ORDER — HYDROXYPROGESTERONE CAPROATE 250 MG/ML IM OIL
250.0000 mg | TOPICAL_OIL | Freq: Once | INTRAMUSCULAR | Status: AC
  Administered 2012-04-25: 250 mg via INTRAMUSCULAR

## 2012-04-25 NOTE — Progress Notes (Signed)
Doing well.  Good fetal movement, denies vaginal bleeding, LOF.  Is feeling intermittent mild abdominal pain.  Daughter has had GI virus.  Concerned about early labor.  Cervix Ft/long/high.  Encouraged increased PO fluids, rest. PTL precautions reviewed.

## 2012-04-25 NOTE — Progress Notes (Signed)
Pulse 96 Pt is concerned with abdominal pain she is having, would like to be checked to make sure its not related to the baby.

## 2012-05-02 ENCOUNTER — Ambulatory Visit: Payer: Medicaid Other

## 2012-05-02 ENCOUNTER — Encounter: Payer: Self-pay | Admitting: Obstetrics & Gynecology

## 2012-05-07 ENCOUNTER — Inpatient Hospital Stay (HOSPITAL_COMMUNITY)
Admission: AD | Admit: 2012-05-07 | Discharge: 2012-05-07 | Disposition: A | Discharge status: Home / Self Care | Payer: Medicaid Other | Source: Ambulatory Visit | Attending: Obstetrics & Gynecology | Admitting: Obstetrics & Gynecology

## 2012-05-07 ENCOUNTER — Encounter (HOSPITAL_COMMUNITY): Payer: Self-pay | Admitting: *Deleted

## 2012-05-07 DIAGNOSIS — N760 Acute vaginitis: Secondary | ICD-10-CM | POA: Insufficient documentation

## 2012-05-07 DIAGNOSIS — A499 Bacterial infection, unspecified: Secondary | ICD-10-CM | POA: Insufficient documentation

## 2012-05-07 DIAGNOSIS — M545 Low back pain, unspecified: Secondary | ICD-10-CM | POA: Insufficient documentation

## 2012-05-07 DIAGNOSIS — N949 Unspecified condition associated with female genital organs and menstrual cycle: Secondary | ICD-10-CM

## 2012-05-07 DIAGNOSIS — O99891 Other specified diseases and conditions complicating pregnancy: Secondary | ICD-10-CM | POA: Insufficient documentation

## 2012-05-07 DIAGNOSIS — B9689 Other specified bacterial agents as the cause of diseases classified elsewhere: Secondary | ICD-10-CM | POA: Insufficient documentation

## 2012-05-07 DIAGNOSIS — O2390 Unspecified genitourinary tract infection in pregnancy, unspecified trimester: Secondary | ICD-10-CM | POA: Insufficient documentation

## 2012-05-07 DIAGNOSIS — K5900 Constipation, unspecified: Secondary | ICD-10-CM

## 2012-05-07 DIAGNOSIS — O26899 Other specified pregnancy related conditions, unspecified trimester: Secondary | ICD-10-CM

## 2012-05-07 DIAGNOSIS — M549 Dorsalgia, unspecified: Secondary | ICD-10-CM

## 2012-05-07 LAB — URINALYSIS, ROUTINE W REFLEX MICROSCOPIC
Glucose, UA: NEGATIVE mg/dL
Ketones, ur: NEGATIVE mg/dL
pH: 7 (ref 5.0–8.0)

## 2012-05-07 LAB — WET PREP, GENITAL
Trich, Wet Prep: NONE SEEN
Yeast Wet Prep HPF POC: NONE SEEN

## 2012-05-07 MED ORDER — METRONIDAZOLE 500 MG PO TABS
500.0000 mg | ORAL_TABLET | Freq: Two times a day (BID) | ORAL | Status: DC
Start: 2012-05-07 — End: 2012-05-07

## 2012-05-07 MED ORDER — DOCUSATE SODIUM 100 MG PO CAPS: 100.0000 mg | ORAL_CAPSULE | Freq: Two times a day (BID) | ORAL | Status: DC | PRN

## 2012-05-07 MED ORDER — METRONIDAZOLE 500 MG PO TABS: 500.0000 mg | ORAL_TABLET | Freq: Two times a day (BID) | ORAL | Status: DC

## 2012-05-07 NOTE — MAU Note (Signed)
Pt states having back pain that is constant since yesterday. Denies uti s/s. Does note hemmorroids are "coming out". Denies bleeding or abnormal vaginal discharge. Goes to [facility].

## 2012-05-07 NOTE — MAU Provider Note (Signed)
Chief Complaint:  Back Pain   First Provider Initiated Contact with Patient 05/07/12 1659      HPI: Tonya David is a 26 y.o. [id number] at [redacted]w[redacted]d who presents to maternity admissions reporting 1 day history of bilateral constant dull mid and low back pain. Pain is worse when she shifts positions. Has not tried analgesics. Also thinks she has hemorrhoids because of rectal discomfort and straining with bowel movement.. BM qd but hard stools yesterday. No rectal bleeding. Concerned that her discharge is copious and at times malodorous.  Denies contractions, leakage of fluid or vaginal bleeding. Good fetal movement.   Pregnancy Course: Hx PTDs, on 17-P weekly  Past Medical History: Past Medical History  Diagnosis Date  . No pertinent past medical history     Past obstetric history: OB History    Grav Para Term Preterm Abortions TAB SAB Ect Mult Living   6 4 1 3 1 1  0 0 0 4     # Outc Date GA Lbr Len/2nd Wgt Sex Del Anes PTL Lv   1 TRM 2007 [redacted]w[redacted]d   M SVD  No Yes   2 PRE 2008 [redacted]w[redacted]d   M SVD  Yes Yes   3 PRE 2009 [redacted]w[redacted]d   F SVD  Yes Yes   4 PRE 2011 [redacted]w[redacted]d   F SVD  Yes Yes   5 TAB            6 CUR               Past Surgical History: Past Surgical History  Procedure Date  . No past surgeries     Family History: noncontributory   Social History: History  Substance Use Topics  . Smoking status: Former Smoker    Types: Cigarettes  . Smokeless tobacco: Never Used  . Alcohol Use: No    Allergies: No Known Allergies  Meds:  No prescriptions prior to admission    ROS: Pertinent findings in history of present illness.  Physical Exam  Blood pressure 126/63, pulse 98, temperature 98.4 F (36.9 C), temperature source Oral, resp. rate 16, height 5\' 6"  (1.676 m), weight 73.256 kg (161 lb 8 oz), last menstrual period 09/22/2011. GENERAL: Well-developed, well-nourished female in no acute distress.  HEENT: normocephalic HEART: normal rate RESP: normal effort ABDOMEN: Soft,  non-tender, gravid appropriate for gestational age EXTREMITIES: Nontender, no edema NEURO: alert and oriented SPECULUM EXAM: NEFG, large amount bubbly white dischargedischarge, no blood, cervix clean  Dilation: Fingertip Effacement (%): 30 Cervical Position: Posterior Hard stool in rectum. No external hemorrhoids  FHT:  Baseline 140 , moderate variability, accelerations present, no decelerations Contractions: irritatbility   Labs: Results for orders placed during the hospital encounter of 05/07/12 (from the past 24 hour(s))  URINALYSIS, ROUTINE W REFLEX MICROSCOPIC     Status: Abnormal   Collection Time   05/07/12  5:10 PM      Component Value Range   Color, Urine YELLOW  YELLOW   APPearance CLOUDY (*) CLEAR   Specific Gravity, Urine 1.020  1.005 - 1.030   pH 7.0  5.0 - 8.0   Glucose, UA NEGATIVE  NEGATIVE mg/dL   Hgb urine dipstick NEGATIVE  NEGATIVE   Bilirubin Urine NEGATIVE  NEGATIVE   Ketones, ur NEGATIVE  NEGATIVE mg/dL   Protein, ur NEGATIVE  NEGATIVE mg/dL   Urobilinogen, UA 0.2  0.0 - 1.0 mg/dL   Nitrite NEGATIVE  NEGATIVE   Leukocytes, UA SMALL (*) NEGATIVE  URINE MICROSCOPIC-ADD ON  Status: Abnormal   Collection Time   05/07/12  5:10 PM      Component Value Range   Squamous Epithelial / LPF MANY (*) RARE   WBC, UA 0-2  <3 WBC/hpf   Bacteria, UA MANY (*) RARE  WET PREP, GENITAL     Status: Abnormal   Collection Time   05/07/12  6:20 PM      Component Value Range   Yeast Wet Prep HPF POC NONE SEEN  NONE SEEN   Trich, Wet Prep NONE SEEN  NONE SEEN   Clue Cells Wet Prep HPF POC FEW (*) NONE SEEN   WBC, Wet Prep HPF POC FEW (*) NONE SEEN      Assessment:  1. Back pain complicating pregnancy   2. Constipation   3. Round ligament pain   BV [id number] @ [redacted]w[redacted]d Category 1 FHR  Plan: Discharge home Preterm labor precautions and fetal kick counts Acetaminophen for pain. Colace RX. AVS on back and RLP   Medication List     As of 05/07/2012  7:48 PM      TAKE these medications         docusate sodium 100 MG capsule   Commonly known as: COLACE   Take 1 capsule (100 mg total) by mouth 2 (two) times daily as needed for constipation.      metroNIDAZOLE 500 MG tablet   Commonly known as: FLAGYL   Take 1 tablet (500 mg total) by mouth every 12 (twelve) hours.       F/U [facility]  Ishmeal Rorie Colin Mulders, CNM 05/07/2012 5:02 PM

## 2012-05-09 ENCOUNTER — Encounter: Payer: Medicaid Other | Admitting: Obstetrics & Gynecology

## 2012-05-09 ENCOUNTER — Telehealth: Payer: Self-pay | Admitting: Obstetrics & Gynecology

## 2012-05-09 NOTE — Telephone Encounter (Signed)
Called patient about her missed appointment. Dr. Penne Lash would like you to come in either this afternoon or tomorrow morning for your 17-p only. Message was left on voicemail.

## 2012-06-04 ENCOUNTER — Inpatient Hospital Stay (HOSPITAL_COMMUNITY)
Admission: AD | Admit: 2012-06-04 | Discharge: 2012-06-04 | Disposition: A | Discharge status: Home / Self Care | Payer: Medicaid Other | Source: Ambulatory Visit | Attending: Obstetrics & Gynecology | Admitting: Obstetrics & Gynecology

## 2012-06-04 ENCOUNTER — Encounter (HOSPITAL_COMMUNITY): Payer: Self-pay

## 2012-06-04 DIAGNOSIS — R109 Unspecified abdominal pain: Secondary | ICD-10-CM | POA: Insufficient documentation

## 2012-06-04 DIAGNOSIS — O99891 Other specified diseases and conditions complicating pregnancy: Secondary | ICD-10-CM | POA: Insufficient documentation

## 2012-06-04 DIAGNOSIS — N949 Unspecified condition associated with female genital organs and menstrual cycle: Secondary | ICD-10-CM | POA: Insufficient documentation

## 2012-06-04 LAB — URINE MICROSCOPIC-ADD ON

## 2012-06-04 LAB — URINALYSIS, ROUTINE W REFLEX MICROSCOPIC
Glucose, UA: NEGATIVE mg/dL
Hgb urine dipstick: NEGATIVE
Specific Gravity, Urine: 1.015 (ref 1.005–1.030)
Urobilinogen, UA: 0.2 mg/dL (ref 0.0–1.0)
pH: 7 (ref 5.0–8.0)

## 2012-06-04 NOTE — MAU Note (Signed)
Patient is in with c/o constant upper abdominal pain (patient states that it does not feel like contractions), pelvic pressure. She denies vaginal bleeding or lof. She reports good fetal movement. She states that she is suppose to get 17p injections each week but have not received it in over 2 weeks.

## 2012-06-04 NOTE — Progress Notes (Signed)
Dr Chelsea Aus notified of patient and her presenting complaints. Will come to evaluate patient.

## 2012-06-04 NOTE — MAU Provider Note (Signed)
Chief Complaint:  Pain   First Provider Initiated Contact with Patient 06/04/12 1011      HPI: Tonya David is a 26 y.o. [id number] at [redacted]w[redacted]d who presents to maternity admissions reporting pelvic pressure and fleeting sharp mid upper abdominal pain. Pelvic pressure is worse with moving and position changes. Also concerned she has missed her last few PN appointments and not gotten her 17-P. Denies dysuria,  Denies contractions, leakage of fluid or vaginal bleeding. Good fetal movement.   Pregnancy Course: Uncomplicated except hx PTBs  Past Medical History: Past Medical History  Diagnosis Date  . No pertinent past medical history     Past obstetric history: OB History    Grav Para Term Preterm Abortions TAB SAB Ect Mult Living   6 4 1 3 1 1  0 0 0 4     # Outc Date GA Lbr Len/2nd Wgt Sex Del Anes PTL Lv   1 TRM 2007 [redacted]w[redacted]d   M SVD  No Yes   2 PRE 2008 [redacted]w[redacted]d   M SVD  Yes Yes   3 PRE 2009 [redacted]w[redacted]d   F SVD  Yes Yes   4 PRE 2011 [redacted]w[redacted]d   F SVD  Yes Yes   5 TAB            6 CUR               Past Surgical History: Past Surgical History  Procedure Date  . No past surgeries     Family History: Family History  Problem Relation Age of Onset  . Alcohol abuse Neg Hx   . Arthritis Neg Hx   . Asthma Neg Hx   . Birth defects Neg Hx   . Cancer Neg Hx   . COPD Neg Hx   . Depression Neg Hx   . Diabetes Neg Hx   . Drug abuse Neg Hx   . Early death Neg Hx   . Hearing loss Neg Hx   . Heart disease Neg Hx   . Hypertension Neg Hx   . Hyperlipidemia Neg Hx   . Kidney disease Neg Hx   . Learning disabilities Neg Hx   . Mental illness Neg Hx   . Mental retardation Neg Hx   . Miscarriages / Stillbirths Neg Hx   . Stroke Neg Hx   . Vision loss Neg Hx     Social History: History  Substance Use Topics  . Smoking status: Former Smoker    Types: Cigarettes  . Smokeless tobacco: Never Used  . Alcohol Use: No    Allergies: No Known Allergies  Meds:  Prescriptions prior to admission   Medication Sig Dispense Refill  . docusate sodium (COLACE) 100 MG capsule Take 1 capsule (100 mg total) by mouth 2 (two) times daily as needed for constipation.  30 capsule  2    ROS: Pertinent findings in history of present illness.  Physical Exam  Blood pressure 120/65, pulse 96, temperature 98 F (36.7 C), temperature source Oral, resp. rate 16, height 5\' 6"  (1.676 m), weight 75.297 kg (166 lb), last menstrual period 09/22/2011. GENERAL: Well-developed, well-nourished female in no acute distress.  HEENT: normocephalic HEART: normal rate RESP: normal effort ABDOMEN: Soft, non-tender, gravid appropriate for gestational age BACK: neg CVAT EXTREMITIES: Nontender, no edema NEURO: alert and oriented SPECULUM EXAM: NEFG, physiologic discharge, no blood, cervix clean  Dilation: Fingertip Effacement (%): 50 Cervical Position: Posterior Station: -2 Presentation: Vertex Exam by:: diedre poe cnm   FHT:  Baseline 140 , moderate variability, accelerations present, no decelerations Contractions: none   Labs: Results for orders placed during the hospital encounter of 06/04/12 (from the past 24 hour(s))  URINALYSIS, ROUTINE W REFLEX MICROSCOPIC     Status: Abnormal   Collection Time   06/04/12  9:43 AM      Component Value Range   Color, Urine YELLOW  YELLOW   APPearance CLEAR  CLEAR   Specific Gravity, Urine 1.015  1.005 - 1.030   pH 7.0  5.0 - 8.0   Glucose, UA NEGATIVE  NEGATIVE mg/dL   Hgb urine dipstick NEGATIVE  NEGATIVE   Bilirubin Urine NEGATIVE  NEGATIVE   Ketones, ur NEGATIVE  NEGATIVE mg/dL   Protein, ur NEGATIVE  NEGATIVE mg/dL   Urobilinogen, UA 0.2  0.0 - 1.0 mg/dL   Nitrite NEGATIVE  NEGATIVE   Leukocytes, UA MODERATE (*) NEGATIVE  URINE MICROSCOPIC-ADD ON     Status: Abnormal   Collection Time   06/04/12  9:43 AM      Component Value Range   Squamous Epithelial / LPF MANY (*) RARE   WBC, UA 3-6  <3 WBC/hpf   RBC / HPF 0-2  <3 RBC/hpf   Bacteria, UA RARE   RARE   MAU Course: GBS done; Urine C&S sent  Assessment: 1. Round ligament pain     Plan: Discharge home AVS on RLP Labor precautions and fetal kick counts F/U appt made next Tues  9 am   Medication List     As of 06/04/2012 10:29 AM    TAKE these medications         docusate sodium 100 MG capsule   Commonly known as: COLACE   Take 1 capsule (100 mg total) by mouth 2 (two) times daily as needed for constipation.         Danae Orleans, CNM 06/04/2012 10:13 AM

## 2012-06-05 LAB — URINE CULTURE
Colony Count: NO GROWTH
Culture: NO GROWTH

## 2012-06-06 LAB — CULTURE, BETA STREP (GROUP B ONLY)

## 2012-06-11 ENCOUNTER — Encounter: Payer: Self-pay | Admitting: Obstetrics and Gynecology

## 2012-06-11 ENCOUNTER — Ambulatory Visit (INDEPENDENT_AMBULATORY_CARE_PROVIDER_SITE_OTHER): Payer: Self-pay | Admitting: Obstetrics and Gynecology

## 2012-06-11 VITALS — BP 126/70 | Wt 165.2 lb

## 2012-06-11 DIAGNOSIS — O09299 Supervision of pregnancy with other poor reproductive or obstetric history, unspecified trimester: Secondary | ICD-10-CM

## 2012-06-11 DIAGNOSIS — O09219 Supervision of pregnancy with history of pre-term labor, unspecified trimester: Secondary | ICD-10-CM

## 2012-06-11 DIAGNOSIS — O0992 Supervision of high risk pregnancy, unspecified, second trimester: Secondary | ICD-10-CM

## 2012-06-11 LAB — POCT URINALYSIS DIP (DEVICE)
Ketones, ur: NEGATIVE mg/dL
Protein, ur: NEGATIVE mg/dL
Specific Gravity, Urine: 1.015 (ref 1.005–1.030)
pH: 7 (ref 5.0–8.0)

## 2012-06-11 MED ORDER — TETANUS-DIPHTH-ACELL PERTUSSIS 5-2.5-18.5 LF-MCG/0.5 IM SUSP: 0.5000 mL | Freq: Once | INTRAMUSCULAR | Status: DC

## 2012-06-11 NOTE — Progress Notes (Signed)
Patient missed a few appointments. 1 hr GCT and labs today. Patient desires BTL for contraception will have her sign papers today. FM/labor precautions reviewed

## 2012-06-11 NOTE — Progress Notes (Signed)
Pulse: 90

## 2012-06-12 ENCOUNTER — Inpatient Hospital Stay (HOSPITAL_COMMUNITY)
Admission: AD | Admit: 2012-06-12 | Discharge: 2012-06-12 | Disposition: A | Discharge status: Home / Self Care | Payer: Medicaid Other | Source: Ambulatory Visit | Attending: Obstetrics and Gynecology | Admitting: Obstetrics and Gynecology

## 2012-06-12 ENCOUNTER — Encounter (HOSPITAL_COMMUNITY): Payer: Self-pay | Admitting: *Deleted

## 2012-06-12 ENCOUNTER — Telehealth: Payer: Self-pay | Admitting: *Deleted

## 2012-06-12 DIAGNOSIS — O479 False labor, unspecified: Secondary | ICD-10-CM | POA: Insufficient documentation

## 2012-06-12 HISTORY — DX: Anemia, unspecified: D64.9

## 2012-06-12 HISTORY — DX: Urinary tract infection, site not specified: N39.0

## 2012-06-12 LAB — GLUCOSE TOLERANCE, 1 HOUR (50G) W/O FASTING: Glucose, 1 Hour GTT: 151 mg/dL — ABNORMAL HIGH (ref 70–140)

## 2012-06-12 LAB — HIV ANTIBODY (ROUTINE TESTING W REFLEX): HIV: NONREACTIVE

## 2012-06-12 NOTE — MAU Note (Signed)
Contractions started last night. Seemed worse last night. Denies problems with preg, 4 vag deliveries.  No bleeding or leaking.

## 2012-06-12 NOTE — MAU Note (Signed)
Contractions since last night was checked in the clinic yesterday 3 cm

## 2012-06-12 NOTE — Telephone Encounter (Signed)
Message copied by Jill Side on Wed Jun 12, 2012  9:57 AM ------      Message from: CONSTANT, PEGGY      Created: Wed Jun 12, 2012  9:36 AM       Needs 3 hr GTT

## 2012-06-12 NOTE — Telephone Encounter (Signed)
Called pt and informed her of abnormal 1hr GTT and need for 3hr GTT.  Pt stated that she is on the way to MAU for evaluation of possible labor because she is having contractions. I explained the 3hr GTT test procedure and pt will call back to schedule test for 12/6 if she is not admitted today.  Pt voiced understanding.

## 2012-06-14 ENCOUNTER — Inpatient Hospital Stay (HOSPITAL_COMMUNITY)
Admission: AD | Admit: 2012-06-14 | Discharge: 2012-06-15 | DRG: 774 | Disposition: A | Discharge status: Home / Self Care | Payer: Medicaid Other | Source: Ambulatory Visit | Attending: Obstetrics & Gynecology | Admitting: Obstetrics & Gynecology

## 2012-06-14 ENCOUNTER — Encounter (HOSPITAL_COMMUNITY): Payer: Self-pay | Admitting: Anesthesiology

## 2012-06-14 ENCOUNTER — Encounter (HOSPITAL_COMMUNITY): Payer: Self-pay | Admitting: *Deleted

## 2012-06-14 ENCOUNTER — Inpatient Hospital Stay (HOSPITAL_COMMUNITY): Payer: Medicaid Other | Admitting: Anesthesiology

## 2012-06-14 DIAGNOSIS — IMO0001 Reserved for inherently not codable concepts without codable children: Secondary | ICD-10-CM

## 2012-06-14 LAB — CBC
MCH: 30.4 pg (ref 26.0–34.0)
MCHC: 33.3 g/dL (ref 30.0–36.0)
MCV: 91.3 fL (ref 78.0–100.0)
Platelets: 396 10*3/uL (ref 150–400)
RDW: 14.5 % (ref 11.5–15.5)

## 2012-06-14 LAB — RPR: RPR Ser Ql: NONREACTIVE

## 2012-06-14 MED ORDER — OXYCODONE-ACETAMINOPHEN 5-325 MG PO TABS
1.0000 | ORAL_TABLET | ORAL | Status: DC | PRN
  Administered 2012-06-14 – 2012-06-15 (×2): 2 via ORAL
  Administered 2012-06-15: 1 via ORAL
  Administered 2012-06-15 (×2): 2 via ORAL
  Filled 2012-06-14 (×4): qty 2
  Filled 2012-06-14: qty 1

## 2012-06-14 MED ORDER — LACTATED RINGERS IV SOLN
INTRAVENOUS | Status: DC
  Administered 2012-06-14 (×2): via INTRAVENOUS

## 2012-06-14 MED ORDER — BENZOCAINE-MENTHOL 20-0.5 % EX AERO
1.0000 "application " | INHALATION_SPRAY | CUTANEOUS | Status: DC | PRN
  Administered 2012-06-15: 1 via TOPICAL
  Filled 2012-06-14: qty 56

## 2012-06-14 MED ORDER — ONDANSETRON HCL 4 MG PO TABS: 4.0000 mg | ORAL_TABLET | ORAL | Status: DC | PRN

## 2012-06-14 MED ORDER — ZOLPIDEM TARTRATE 5 MG PO TABS: 5.0000 mg | ORAL_TABLET | Freq: Every evening | ORAL | Status: DC | PRN

## 2012-06-14 MED ORDER — OXYTOCIN BOLUS FROM INFUSION: 500.0000 mL | INTRAVENOUS | Status: DC

## 2012-06-14 MED ORDER — ONDANSETRON HCL 4 MG/2ML IJ SOLN: 4.0000 mg | Freq: Four times a day (QID) | INTRAMUSCULAR | Status: DC | PRN

## 2012-06-14 MED ORDER — LACTATED RINGERS IV SOLN
500.0000 mL | Freq: Once | INTRAVENOUS | Status: AC
  Administered 2012-06-14: 500 mL via INTRAVENOUS

## 2012-06-14 MED ORDER — PRENATAL MULTIVITAMIN CH
1.0000 | ORAL_TABLET | Freq: Every day | ORAL | Status: DC
  Administered 2012-06-15: 1 via ORAL
  Filled 2012-06-14: qty 1

## 2012-06-14 MED ORDER — OXYTOCIN 40 UNITS IN LACTATED RINGERS INFUSION - SIMPLE MED
INTRAVENOUS | Status: AC
  Administered 2012-06-14: 40 [IU]
  Filled 2012-06-14: qty 1000

## 2012-06-14 MED ORDER — FENTANYL 2.5 MCG/ML BUPIVACAINE 1/10 % EPIDURAL INFUSION (WH - ANES)
14.0000 mL/h | INTRAMUSCULAR | Status: DC
  Administered 2012-06-14: 14 mL/h via EPIDURAL
  Filled 2012-06-14: qty 125

## 2012-06-14 MED ORDER — OXYCODONE-ACETAMINOPHEN 5-325 MG PO TABS: 1.0000 | ORAL_TABLET | ORAL | Status: DC | PRN

## 2012-06-14 MED ORDER — ACETAMINOPHEN 325 MG PO TABS: 650.0000 mg | ORAL_TABLET | ORAL | Status: DC | PRN

## 2012-06-14 MED ORDER — SENNOSIDES-DOCUSATE SODIUM 8.6-50 MG PO TABS
2.0000 | ORAL_TABLET | Freq: Every day | ORAL | Status: DC
  Administered 2012-06-14: 2 via ORAL

## 2012-06-14 MED ORDER — SIMETHICONE 80 MG PO CHEW: 80.0000 mg | CHEWABLE_TABLET | ORAL | Status: DC | PRN

## 2012-06-14 MED ORDER — PHENYLEPHRINE 40 MCG/ML (10ML) SYRINGE FOR IV PUSH (FOR BLOOD PRESSURE SUPPORT): 80.0000 ug | PREFILLED_SYRINGE | INTRAVENOUS | Status: DC | PRN

## 2012-06-14 MED ORDER — DIPHENHYDRAMINE HCL 25 MG PO CAPS
25.0000 mg | ORAL_CAPSULE | Freq: Four times a day (QID) | ORAL | Status: DC | PRN
  Administered 2012-06-14: 25 mg via ORAL
  Filled 2012-06-14: qty 1

## 2012-06-14 MED ORDER — OXYTOCIN 40 UNITS IN LACTATED RINGERS INFUSION - SIMPLE MED
1.0000 m[IU]/min | INTRAVENOUS | Status: DC
  Administered 2012-06-14: 500 m[IU]/min via INTRAVENOUS

## 2012-06-14 MED ORDER — TETANUS-DIPHTH-ACELL PERTUSSIS 5-2.5-18.5 LF-MCG/0.5 IM SUSP
0.5000 mL | Freq: Once | INTRAMUSCULAR | Status: AC
  Administered 2012-06-15: 0.5 mL via INTRAMUSCULAR
  Filled 2012-06-14: qty 0.5

## 2012-06-14 MED ORDER — ONDANSETRON HCL 4 MG/2ML IJ SOLN: 4.0000 mg | INTRAMUSCULAR | Status: DC | PRN

## 2012-06-14 MED ORDER — LIDOCAINE HCL (PF) 1 % IJ SOLN
INTRAMUSCULAR | Status: DC | PRN
  Administered 2012-06-14 (×4): 4 mL

## 2012-06-14 MED ORDER — CITRIC ACID-SODIUM CITRATE 334-500 MG/5ML PO SOLN: 30.0000 mL | ORAL | Status: DC | PRN

## 2012-06-14 MED ORDER — TERBUTALINE SULFATE 1 MG/ML IJ SOLN: 0.2500 mg | Freq: Once | INTRAMUSCULAR | Status: DC | PRN

## 2012-06-14 MED ORDER — WITCH HAZEL-GLYCERIN EX PADS
1.0000 "application " | MEDICATED_PAD | CUTANEOUS | Status: DC | PRN
  Administered 2012-06-14: 1 via TOPICAL

## 2012-06-14 MED ORDER — IBUPROFEN 600 MG PO TABS
600.0000 mg | ORAL_TABLET | Freq: Four times a day (QID) | ORAL | Status: DC
  Administered 2012-06-14 – 2012-06-15 (×4): 600 mg via ORAL
  Filled 2012-06-14 (×3): qty 1

## 2012-06-14 MED ORDER — DIBUCAINE 1 % RE OINT
1.0000 "application " | TOPICAL_OINTMENT | RECTAL | Status: DC | PRN
  Administered 2012-06-14: 1 via RECTAL
  Filled 2012-06-14: qty 28

## 2012-06-14 MED ORDER — EPHEDRINE 5 MG/ML INJ: 10.0000 mg | INTRAVENOUS | Status: DC | PRN

## 2012-06-14 MED ORDER — DIPHENHYDRAMINE HCL 50 MG/ML IJ SOLN
12.5000 mg | INTRAMUSCULAR | Status: DC | PRN
  Administered 2012-06-14: 12.5 mg via INTRAVENOUS
  Filled 2012-06-14: qty 1

## 2012-06-14 MED ORDER — EPHEDRINE 5 MG/ML INJ
10.0000 mg | INTRAVENOUS | Status: DC | PRN
  Filled 2012-06-14: qty 4

## 2012-06-14 MED ORDER — LANOLIN HYDROUS EX OINT: TOPICAL_OINTMENT | CUTANEOUS | Status: DC | PRN

## 2012-06-14 MED ORDER — IBUPROFEN 600 MG PO TABS: 600.0000 mg | ORAL_TABLET | Freq: Four times a day (QID) | ORAL | Status: DC | PRN

## 2012-06-14 MED ORDER — LIDOCAINE HCL (PF) 1 % IJ SOLN: 30.0000 mL | INTRAMUSCULAR | Status: DC | PRN

## 2012-06-14 MED ORDER — LACTATED RINGERS IV SOLN: 500.0000 mL | INTRAVENOUS | Status: DC | PRN

## 2012-06-14 MED ORDER — FLEET ENEMA 7-19 GM/118ML RE ENEM: 1.0000 | ENEMA | RECTAL | Status: DC | PRN

## 2012-06-14 MED ORDER — OXYTOCIN 40 UNITS IN LACTATED RINGERS INFUSION - SIMPLE MED
62.5000 mL/h | INTRAVENOUS | Status: DC
  Administered 2012-06-14: 2 m[IU]/min via INTRAVENOUS
  Filled 2012-06-14: qty 1000

## 2012-06-14 MED ORDER — PHENYLEPHRINE 40 MCG/ML (10ML) SYRINGE FOR IV PUSH (FOR BLOOD PRESSURE SUPPORT)
80.0000 ug | PREFILLED_SYRINGE | INTRAVENOUS | Status: DC | PRN
  Filled 2012-06-14: qty 5

## 2012-06-14 NOTE — Progress Notes (Signed)
VANIA ROSERO is a 26 y.o. [phone] at 109w0d by ultrasound admitted for active labor  Subjective: Comfortable with epidural.    Objective: BP 125/74  Pulse 90  Temp 98 F (36.7 C) (Oral)  Resp 16  Ht 5\' 6"  (1.676 m)  Wt 167 lb (75.751 kg)  BMI 26.95 kg/m2  SpO2 100%  LMP 09/22/2011      FHT:  FHR: 140 bpm, variability: moderate,  accelerations:  Present,  decelerations:  Absent UC:   regular, every 2-3 minutes SVE:   Dilation: 5 Effacement (%): 70 Station: -1 Exam by:: weston,[email or web address]  Labs: Lab Results  Component Value Date   WBC 21.6* 06/14/2012   HGB 9.8* 06/14/2012   HCT 29.4* 06/14/2012   MCV 91.3 06/14/2012   PLT 396 06/14/2012    Assessment / Plan: Spontaneous labor, progressing normally  Labor: Progressing normally Preeclampsia:   Fetal Wellbeing:  Category I Pain Control:  Epidural I/D:  n/a Anticipated MOD:  NSVD  Reed Dady 06/14/2012, 9:24 AM

## 2012-06-14 NOTE — Progress Notes (Addendum)
Late entry of note. Discussed starting pitocin with pt's nurse, Vernona Rieger; cervix re-examined by her and felt to be 4 cm in disagreement with previous 5 cm check around 0700, contractions not very strong. Orders placed for pit at 0945. Prior to starting pitocin, pt had a prolonged 2-3 minute decel (nadir of FHR 70 for ~15 seconds), not in relation to a contraction. Strip improved with repositioning and small fluid bolus. Will continue to monitor.  Bobbye Morton, MD PGY-1, [facility]

## 2012-06-14 NOTE — Progress Notes (Signed)
Tonya David is a 26 y.o. [id number] at [redacted]w[redacted]d  Subjective: Comfortable with epidural. Not feeling pain with contractions, not really feeling pressure.  Objective: BP 116/67  Pulse 88  Temp 98.1 F (36.7 C) (Oral)  Resp 16  Ht 5\' 6"  (1.676 m)  Wt 75.751 kg (167 lb)  BMI 26.95 kg/m2  SpO2 100%  LMP 09/22/2011      FHT:  FHR: 130 bpm, variability: moderate,  accelerations:  Present,  decelerations:  Absent UC:   regular, every 3-6 minutes SVE:   Dilation: 4 Effacement (%): 70 Station: -2 Exam by:: lee  Labs: Lab Results  Component Value Date   WBC 21.6* 06/14/2012   HGB 9.8* 06/14/2012   HCT 29.4* 06/14/2012   MCV 91.3 06/14/2012   PLT 396 06/14/2012    Assessment / Plan: Spontaneous labor, progressing slowly on Pit at 6  Labor: Continue Pitocin, will consider AROM/IUPC if progression remains slow Fetal Wellbeing:  Category I Pain Control:  Epidural I/D:  n/a Anticipated MOD:  NSVD  Street, Cristal Deer 06/14/2012, 1:33 PM

## 2012-06-14 NOTE — Anesthesia Procedure Notes (Signed)
Epidural Patient location during procedure: OB Start time: 06/14/2012 8:31 AM  Staffing Performed by: anesthesiologist   Preanesthetic Checklist Completed: patient identified, site marked, surgical consent, pre-op evaluation, timeout performed, IV checked, risks and benefits discussed and monitors and equipment checked  Epidural Patient position: sitting Prep: site prepped and draped and DuraPrep Patient monitoring: continuous pulse ox and blood pressure Approach: midline Injection technique: LOR air  Needle:  Needle type: Tuohy  Needle gauge: 17 G Needle length: 9 cm and 9 Needle insertion depth: 5 cm cm Catheter type: closed end flexible Catheter size: 19 Gauge Catheter at skin depth: 10 cm Test dose: negative  Assessment Events: blood not aspirated, injection not painful, no injection resistance, negative IV test and no paresthesia  Additional Notes Discussed risk of headache, infection, bleeding, nerve injury and failed or incomplete block.  Patient voices understanding and wishes to proceed. Reason for block:procedure for pain

## 2012-06-14 NOTE — MAU Provider Note (Signed)
  History     CSN: [id number]  Arrival date and time: 06/14/12 0503   None     Chief Complaint  Patient presents with  . Labor Eval   HPI Mrs. Bufano is a 26 y.o. [id number] at [redacted]w[redacted]d who presents c/o pelvic pressure lower abdominal pain. Se reports that her contractions started around 3am.  She denies leakage of fluid or vaginal bleeding. Good fetal movement.   Past Medical History  Diagnosis Date  . Preterm labor   . Anemia   . Urinary tract infection   . Infection     chlamydia    Past Surgical History  Procedure Date  . Therapeutic abortion     Family History  Problem Relation Age of Onset  . Alcohol abuse Neg Hx   . Arthritis Neg Hx   . Asthma Neg Hx   . Birth defects Neg Hx   . Cancer Neg Hx   . COPD Neg Hx   . Depression Neg Hx   . Diabetes Neg Hx   . Drug abuse Neg Hx   . Early death Neg Hx   . Hearing loss Neg Hx   . Heart disease Neg Hx   . Hypertension Neg Hx   . Hyperlipidemia Neg Hx   . Kidney disease Neg Hx   . Learning disabilities Neg Hx   . Mental illness Neg Hx   . Mental retardation Neg Hx   . Miscarriages / Stillbirths Neg Hx   . Stroke Neg Hx   . Vision loss Neg Hx   . Other Neg Hx     History  Substance Use Topics  . Smoking status: Former Smoker    Types: Cigarettes  . Smokeless tobacco: Never Used     Comment: with preg  . Alcohol Use: No    Allergies: No Known Allergies  No prescriptions prior to admission    Review of Systems  Constitutional: Negative for fever.  Eyes: Negative for blurred vision.  Cardiovascular: Negative for chest pain.  Gastrointestinal: Negative for heartburn.  Genitourinary: Negative for dysuria and frequency.  Neurological: Negative for headaches.   Physical Exam   Blood pressure 122/68, pulse 106, temperature 98.1 F (36.7 C), temperature source Oral, resp. rate 20, height 5\' 6"  (1.676 m), weight 75.751 kg (167 lb), last menstrual period 09/22/2011, SpO2 100.00%.  Physical Exam   Constitutional: She appears well-developed.  HENT:  Head: Normocephalic.  Neck: Normal range of motion.  Cardiovascular: Normal rate and normal heart sounds.   Respiratory: Effort normal.  Dilation: 3cm  Effacement (%): 50  Cervical Position: Posterior  Station: -2  Presentation: Vertex   FHT: Baseline 140 , moderate variability, accelerations present, no decelerations  Contractions: Q36min, regular   MAU Course  Procedures  MDM Monitoring Recheck cervical change in 1 hour  Assessment and Plan  Mrs. Yetman is a 26 y.o. [id number] at [redacted]w[redacted]d who presents c/o contractions.  GBS negative FHT; Category I Cervical change after 2h Will admit patient  Governor Specking 06/14/2012, 6:01 AM   .I have seen the patient with the resident/student and agree with the above.

## 2012-06-14 NOTE — H&P (Signed)
I have seen the patient with the resident/student and agree with the above.   

## 2012-06-14 NOTE — H&P (Signed)
Tonya David is a 26 y.o. [id number] at [redacted]w[redacted]d who presents c/o pelvic pressure lower abdominal pain. Se reports that her contractions started around 3am. She denies leakage of fluid or vaginal bleeding. Good fetal movement.  . Maternal Medical History:  Reason for admission: Reason for admission: contractions.  Contractions: Onset was 3-5 hours ago.   Frequency: regular.   Perceived severity is mild.    Fetal activity: Perceived fetal activity is normal.   Last perceived fetal movement was within the past hour.      OB History    Grav Para Term Preterm Abortions TAB SAB Ect Mult Living   7 4 1 3 2 2  0 0 0 4     Past Medical History  Diagnosis Date  . Preterm labor   . Anemia   . Urinary tract infection   . Infection     chlamydia   Past Surgical History  Procedure Date  . Therapeutic abortion    Family History: family history is negative for Alcohol abuse, and Arthritis, and Asthma, and Birth defects, and Cancer, and COPD, and Depression, and Diabetes, and Drug abuse, and Early death, and Hearing loss, and Heart disease, and Hypertension, and Hyperlipidemia, and Kidney disease, and Learning disabilities, and Mental illness, and Mental retardation, and Miscarriages / Stillbirths, and Stroke, and Vision loss, and Other, . Social History:  reports that she has quit smoking. Her smoking use included Cigarettes. She has never used smokeless tobacco. She reports that she does not drink alcohol or use illicit drugs.   Prenatal Transfer Tool  Maternal Diabetes: No Genetic Screening: Normal Maternal Ultrasounds/Referrals: Normal Fetal Ultrasounds or other Referrals:  None Maternal Substance Abuse:  No Significant Maternal Medications:  None Significant Maternal Lab Results:  None Other Comments:  None  Review of Systems  Constitutional: Negative for fever.  Eyes: Negative for blurred vision.  Cardiovascular: Negative for chest pain.  Gastrointestinal: Negative for heartburn.   Neurological: Negative for dizziness and headaches.    Dilation: 3 Effacement (%): 50 Station: -2 Exam by:: WestonRN Blood pressure 122/68, pulse 106, temperature 98.1 F (36.7 C), temperature source Oral, resp. rate 20, height 5\' 6"  (1.676 m), weight 75.751 kg (167 lb), last menstrual period 09/22/2011, SpO2 100.00%. Maternal Exam:  Uterine Assessment: Contraction strength is mild.  Contraction frequency is regular.   Abdomen: Fetal presentation: vertex     Fetal Exam Fetal Monitor Review: Mode: hand-held doppler probe.   Baseline rate: 140.  Variability: moderate (6-25 bpm).   Pattern: accelerations present and no decelerations.    Fetal State Assessment: Category I - tracings are normal.     Physical Exam  Constitutional: She appears well-developed.  HENT:  Head: Normocephalic.  Neck: Normal range of motion.  Cardiovascular: Normal rate and normal heart sounds.   Respiratory: Effort normal.    Prenatal labs: ABO, Rh: O/POS/-- (09/05 1120) Antibody: NEG (09/05 1120) Rubella: 66.2 (09/05 1120) RPR: NON REAC (12/03 0953)  HBsAg: NEGATIVE (09/05 1120)  HIV: NON REACTIVE (12/03 0953)  GBS:   Pending  Assessment/Plan: Assessment: 1. Labor: Active labor 2. Fetal Wellbeing: Category I  3. GBS: Pending  Plan:  1. Admit to BS  2. Routine L&D orders 3. Analgesia/anesthesia PRN     Governor Specking 06/14/2012, 7:19 AM

## 2012-06-14 NOTE — Progress Notes (Signed)
Called to room by Pt at 18:35 w/ c/o bleeding. Large amount of blood seen on bed pad with boggy uterus (was firm from L&D with no complaints but bilateral numb legs). VSS on exam. 2 large grapefruit clots expressed. MD called and to bedside Pt ended up firm and u/1 with massage. Blood became small. Pitocin started IV with over 2 hrs. 2 Oxycodone given PO for onset of cramping 8/10. Will monitor. Foley emptied with 800cc.

## 2012-06-14 NOTE — Anesthesia Preprocedure Evaluation (Signed)
Anesthesia Evaluation  Patient identified by MRN, date of birth, ID band Patient awake    Reviewed: Allergy & Precautions, H&P , NPO status , Patient's Chart, lab work & pertinent test results, reviewed documented beta blocker date and time   History of Anesthesia Complications Negative for: history of anesthetic complications  Airway Mallampati: III TM Distance: >3 FB Neck ROM: full    Dental  (+) Teeth Intact   Pulmonary neg pulmonary ROS,  breath sounds clear to auscultation        Cardiovascular negative cardio ROS  Rhythm:regular Rate:Normal     Neuro/Psych negative neurological ROS  negative psych ROS   GI/Hepatic negative GI ROS, Neg liver ROS,   Endo/Other  negative endocrine ROS  Renal/GU negative Renal ROS     Musculoskeletal   Abdominal   Peds  Hematology  (+) Blood dyscrasia, anemia ,   Anesthesia Other Findings   Reproductive/Obstetrics (+) Pregnancy                           Anesthesia Physical Anesthesia Plan  ASA: II  Anesthesia Plan: Epidural   Post-op Pain Management:    Induction:   Airway Management Planned:   Additional Equipment:   Intra-op Plan:   Post-operative Plan:   Informed Consent: I have reviewed the patients History and Physical, chart, labs and discussed the procedure including the risks, benefits and alternatives for the proposed anesthesia with the patient or authorized representative who has indicated his/her understanding and acceptance.     Plan Discussed with:   Anesthesia Plan Comments:         Anesthesia Quick Evaluation

## 2012-06-14 NOTE — MAU Note (Signed)
Pt reports she awakened with contractions and "real bad back pain" at 0300,

## 2012-06-15 ENCOUNTER — Encounter (HOSPITAL_COMMUNITY): Payer: Self-pay | Admitting: *Deleted

## 2012-06-15 LAB — CBC
HCT: 26.3 % — ABNORMAL LOW (ref 36.0–46.0)
MCH: 30.1 pg (ref 26.0–34.0)
MCHC: 32.7 g/dL (ref 30.0–36.0)
MCV: 92 fL (ref 78.0–100.0)
RDW: 14.8 % (ref 11.5–15.5)
WBC: 18.4 10*3/uL — ABNORMAL HIGH (ref 4.0–10.5)

## 2012-06-15 MED ORDER — FERROUS SULFATE 325 (65 FE) MG PO TABS: 325.0000 mg | ORAL_TABLET | Freq: Two times a day (BID) | ORAL | Status: DC

## 2012-06-15 MED ORDER — IBUPROFEN 600 MG PO TABS: 600.0000 mg | ORAL_TABLET | Freq: Four times a day (QID) | ORAL | Status: DC | PRN

## 2012-06-15 NOTE — Progress Notes (Signed)
Post Partum Day #1 Subjective: no complaints, up ad lib and tolerating PO  Objective: Blood pressure 119/71, pulse 81, temperature 97.8 F (36.6 C), temperature source Oral, resp. rate 20, height 5\' 6"  (1.676 m), weight 75.751 kg (167 lb), last menstrual period 09/22/2011, SpO2 98.00%, unknown if currently breastfeeding.  Physical Exam:  General: alert, cooperative and no distress Lochia: appropriate Uterine Fundus: firm DVT Evaluation: No evidence of DVT seen on physical exam.   Basename 06/15/12 0530 06/14/12 0745  HGB 8.6* 9.8*  HCT 26.3* 29.4*    Assessment/Plan: Plan for discharge tomorrow; bottlefeeding; undecided on contraception.  May want to go home later today- will discuss with ped as infant will not be 24 hrs until 3p.   LOS: 1 day   Cam Hai 06/15/2012, 8:38 AM

## 2012-06-15 NOTE — Discharge Summary (Signed)
Obstetric Discharge Summary Reason for Admission: onset of labor Prenatal Procedures: none Intrapartum Procedures: spontaneous vaginal delivery Postpartum Procedures: expression of approximately 500cc clots and bimanual exam 2hrs pp Complications-Operative and Postpartum: pph 2hrs after birth  Eating, drinking, voiding, ambulating well.  +flatus.  Lochia and pain wnl.  No complaints. Desires early d/c  Hemoglobin  Date Value Range Status  06/15/2012 8.6* 12.0 - 15.0 g/dL Final     HCT  Date Value Range Status  06/15/2012 26.3* 36.0 - 46.0 % Final  12/6: H/H 9.8/29.4 on admission  Physical Exam:  General: alert, cooperative and no distress Lochia: appropriate Uterine Fundus: firm Incision: n/a DVT Evaluation: No evidence of DVT seen on physical exam. Negative Homan's sign. No cords or calf tenderness. No significant calf/ankle edema.  Had late prenatal care @ 24.6 w/ a total of only 3 visits- UDS not done on baby.   Consulted w/ social worker Johnnette Barrios, who states there would really be no point for a consult w/o UDS on baby.   Discussed w Dr. Despina Hidden who is OK w/ d/c w/o social worker consult.   Discharge Diagnoses: Term Pregnancy-delivered w/ pph 2hrs after birth  Discharge Information: Date: 06/15/2012 Activity: pelvic rest Diet: routine Medications: PNV and Ibuprofen, Ferrous sulfate 325mg  bid Condition: stable Instructions: refer to practice specific booklet Discharge to: home Follow-up Information    Schedule an appointment as soon as possible for a visit with [facility]. (4-6 weeks for postpartum visit.  )    Contact information:   [address] [place] [address] [phone]         Newborn Data: Live born female  Birth Weight: 6 lb 6.8 oz (2914 g) APGAR: 9, 10  Home with mother. Bottlefeeding, undecided about contraception.    Marge Duncans 06/15/2012, 4:25 PM

## 2012-06-15 NOTE — Progress Notes (Signed)
Pt. C/o new onset lower back pain that she she cannot describe type of pain but rates it 7/10 and lower epigastric pain. All VSS and fundal check normal u/2 with scant bleeding and abdominal exam normal. No new drugs today given. Will monitor and treat acute pain,.

## 2012-06-15 NOTE — Progress Notes (Signed)
Spoke to [employer] CNM regarding patient's status and removing her foley. Order received to d/c foley. Will continue to monitor patient.

## 2012-06-16 NOTE — Progress Notes (Signed)
CSW referral received yesterday evening however pt discharged before CSW could assess. Referral reason: LPNC 24 weeks with 3 visits. UDS/meconium collection was not ordered.  

## 2012-06-17 ENCOUNTER — Encounter: Payer: Self-pay | Admitting: Obstetrics & Gynecology

## 2012-06-17 NOTE — Progress Notes (Signed)
Post discharge chart review completed.  

## 2012-06-18 ENCOUNTER — Encounter: Payer: Self-pay | Admitting: Obstetrics & Gynecology

## 2012-06-18 NOTE — Anesthesia Postprocedure Evaluation (Signed)
Patient stable following vaginal delivery.  

## 2012-07-23 ENCOUNTER — Ambulatory Visit: Payer: Self-pay | Admitting: Obstetrics & Gynecology

## 2012-11-19 ENCOUNTER — Emergency Department (HOSPITAL_COMMUNITY): Payer: Self-pay

## 2012-11-19 ENCOUNTER — Emergency Department (HOSPITAL_COMMUNITY)
Admission: EM | Admit: 2012-11-19 | Discharge: 2012-11-19 | Disposition: A | Discharge status: Home / Self Care | Payer: Self-pay | Attending: Emergency Medicine | Admitting: Emergency Medicine

## 2012-11-19 ENCOUNTER — Encounter (HOSPITAL_COMMUNITY): Payer: Self-pay | Admitting: Emergency Medicine

## 2012-11-19 DIAGNOSIS — Z87440 Personal history of urinary (tract) infections: Secondary | ICD-10-CM | POA: Insufficient documentation

## 2012-11-19 DIAGNOSIS — Z8751 Personal history of pre-term labor: Secondary | ICD-10-CM | POA: Insufficient documentation

## 2012-11-19 DIAGNOSIS — Z79899 Other long term (current) drug therapy: Secondary | ICD-10-CM | POA: Insufficient documentation

## 2012-11-19 DIAGNOSIS — A499 Bacterial infection, unspecified: Secondary | ICD-10-CM | POA: Insufficient documentation

## 2012-11-19 DIAGNOSIS — D649 Anemia, unspecified: Secondary | ICD-10-CM | POA: Insufficient documentation

## 2012-11-19 DIAGNOSIS — M549 Dorsalgia, unspecified: Secondary | ICD-10-CM | POA: Insufficient documentation

## 2012-11-19 DIAGNOSIS — N390 Urinary tract infection, site not specified: Secondary | ICD-10-CM | POA: Insufficient documentation

## 2012-11-19 DIAGNOSIS — N760 Acute vaginitis: Secondary | ICD-10-CM | POA: Insufficient documentation

## 2012-11-19 DIAGNOSIS — Z87891 Personal history of nicotine dependence: Secondary | ICD-10-CM | POA: Insufficient documentation

## 2012-11-19 DIAGNOSIS — Z3202 Encounter for pregnancy test, result negative: Secondary | ICD-10-CM | POA: Insufficient documentation

## 2012-11-19 DIAGNOSIS — B9689 Other specified bacterial agents as the cause of diseases classified elsewhere: Secondary | ICD-10-CM | POA: Insufficient documentation

## 2012-11-19 DIAGNOSIS — N898 Other specified noninflammatory disorders of vagina: Secondary | ICD-10-CM | POA: Insufficient documentation

## 2012-11-19 DIAGNOSIS — G43909 Migraine, unspecified, not intractable, without status migrainosus: Secondary | ICD-10-CM | POA: Insufficient documentation

## 2012-11-19 LAB — URINALYSIS, ROUTINE W REFLEX MICROSCOPIC
Glucose, UA: NEGATIVE mg/dL
Ketones, ur: NEGATIVE mg/dL
Nitrite: NEGATIVE
Specific Gravity, Urine: 1.028 (ref 1.005–1.030)
pH: 5 (ref 5.0–8.0)

## 2012-11-19 LAB — CBC WITH DIFFERENTIAL/PLATELET
Basophils Absolute: 0 10*3/uL (ref 0.0–0.1)
Basophils Relative: 0 % (ref 0–1)
Lymphocytes Relative: 33 % (ref 12–46)
MCHC: 33.8 g/dL (ref 30.0–36.0)
Neutro Abs: 3 10*3/uL (ref 1.7–7.7)
Platelets: 341 10*3/uL (ref 150–400)
RDW: 13.1 % (ref 11.5–15.5)
WBC: 5.7 10*3/uL (ref 4.0–10.5)

## 2012-11-19 LAB — WET PREP, GENITAL
Trich, Wet Prep: NONE SEEN
Yeast Wet Prep HPF POC: NONE SEEN

## 2012-11-19 LAB — COMPREHENSIVE METABOLIC PANEL
ALT: 9 U/L (ref 0–35)
AST: 13 U/L (ref 0–37)
Albumin: 4.1 g/dL (ref 3.5–5.2)
CO2: 25 mEq/L (ref 19–32)
Calcium: 9.3 mg/dL (ref 8.4–10.5)
Chloride: 103 mEq/L (ref 96–112)
Creatinine, Ser: 0.68 mg/dL (ref 0.50–1.10)
GFR calc non Af Amer: >90 mL/min (ref >90)
Sodium: 138 mEq/L (ref 135–145)

## 2012-11-19 LAB — PREGNANCY, URINE: Preg Test, Ur: NEGATIVE

## 2012-11-19 MED ORDER — METRONIDAZOLE 500 MG PO TABS: 500.0000 mg | ORAL_TABLET | Freq: Two times a day (BID) | ORAL | Status: DC

## 2012-11-19 MED ORDER — CEPHALEXIN 500 MG PO CAPS: 500.0000 mg | ORAL_CAPSULE | Freq: Four times a day (QID) | ORAL | Status: DC

## 2012-11-19 NOTE — ED Notes (Signed)
Pt states she has had low abdominal pain and low back pain for the past month. Pt states she more discharge than normal. Pt states she has had frequency but no pain with urination. Pt also states she has had migraines on and off for the past month. Pt states the migraines have caused her to vomit. Pt denies any vomiting in the past five days. Pt states she has no difficulties with bowel movements. Pt states she underwent an abortion two months ago also.

## 2012-11-19 NOTE — ED Provider Notes (Signed)
Medical screening examination/treatment/procedure(s) were performed by non-physician practitioner and as supervising physician I was immediately available for consultation/collaboration.  Devi Hopman L Climmie Buelow, MD 11/19/12 1639 

## 2012-11-19 NOTE — ED Provider Notes (Signed)
History     CSN: [id number]  Arrival date & time 11/19/12  0920   First MD Initiated Contact with Patient 11/19/12 1001      Chief Complaint  Patient presents with  . Abdominal Pain  . Back Pain  . Migraine    (Consider location/radiation/quality/duration/timing/severity/associated sxs/prior treatment) HPI Comments: Patient presents emergency department with chief complaints of abdominal pain. She states that she's been having lower abdominal pain for approximately one month. She states that she recently had an abortion 2 months ago. She denies any vaginal bleeding, but does say that she has had some discharge. She states that the pain is improved when she is on her cycle, but then worsens again. She has not tried anything to alleviate her symptoms. The pain does not radiate. She denies fevers, chills, constipation, or diarrhea. Additionally, the patient states that she has a history of migraine headaches, these have been becoming more frequent. She is not having a headache now. She would like to have these investigated in greater detail.  The history is provided by the patient. No language interpreter was used.    Past Medical History  Diagnosis Date  . Preterm labor   . Anemia   . Urinary tract infection     Past Surgical History  Procedure Laterality Date  . Therapeutic abortion      Family History  Problem Relation Age of Onset  . Alcohol abuse Neg Hx   . Arthritis Neg Hx   . Asthma Neg Hx   . Birth defects Neg Hx   . Cancer Neg Hx   . COPD Neg Hx   . Depression Neg Hx   . Diabetes Neg Hx   . Drug abuse Neg Hx   . Early death Neg Hx   . Hearing loss Neg Hx   . Heart disease Neg Hx   . Hypertension Neg Hx   . Hyperlipidemia Neg Hx   . Kidney disease Neg Hx   . Learning disabilities Neg Hx   . Mental illness Neg Hx   . Mental retardation Neg Hx   . Miscarriages / Stillbirths Neg Hx   . Stroke Neg Hx   . Vision loss Neg Hx   . Other Neg Hx     History   Substance Use Topics  . Smoking status: Former Smoker    Types: Cigarettes  . Smokeless tobacco: Never Used     Comment: with preg  . Alcohol Use: Yes     Comment: on occasions    OB History   Grav Para Term Preterm Abortions TAB SAB Ect Mult Living   7 5 2 3 2 2  0 0 0 5      Review of Systems  All other systems reviewed and are negative.    Allergies  Review of patient's allergies indicates no known allergies.  Home Medications   Current Outpatient Rx  Name  Route  Sig  Dispense  Refill  . ferrous sulfate 325 (65 FE) MG tablet   Oral   Take 1 tablet (325 mg total) by mouth 2 (two) times daily with a meal.   60 tablet   3   . ibuprofen (ADVIL,MOTRIN) 600 MG tablet   Oral   Take 1 tablet (600 mg total) by mouth every 6 (six) hours as needed for pain.   30 tablet   0     BP 127/78  Pulse 87  Temp(Src) 98.1 F (36.7 C) (Oral)  Resp 20  Ht  5\' 6"  (1.676 m)  Wt 140 lb (63.504 kg)  BMI 22.61 kg/m2  SpO2 99%  LMP 11/12/2012  Breastfeeding? No  Physical Exam  Nursing note and vitals reviewed. Constitutional: She is oriented to person, place, and time. She appears well-developed and well-nourished.  HENT:  Head: Normocephalic and atraumatic.  Eyes: Conjunctivae and EOM are normal. Pupils are equal, round, and reactive to light.  Neck: Normal range of motion. Neck supple.  Cardiovascular: Normal rate and regular rhythm.  Exam reveals no gallop and no friction rub.   No murmur heard. Pulmonary/Chest: Effort normal and breath sounds normal. No respiratory distress. She has no wheezes. She has no rales. She exhibits no tenderness.  Abdominal: Soft. Bowel sounds are normal. She exhibits no distension and no mass. There is tenderness. There is no rebound and no guarding.  Diffuse abdominal discomfort, but without focal tenderness, no rebound tenderness, no fluid wave, guarding, or signs of peritonitis  Genitourinary: No labial fusion. There is no rash, tenderness,  lesion or injury on the right labia. There is no rash, tenderness, lesion or injury on the left labia. Uterus is not deviated, not enlarged, not fixed and not tender. Cervix exhibits no motion tenderness, no discharge and no friability. Right adnexum displays tenderness. Right adnexum displays no mass and no fullness. Left adnexum displays no mass, no tenderness and no fullness. No erythema, tenderness or bleeding around the vagina. No foreign body around the vagina. No signs of injury around the vagina. Vaginal discharge found.  Right sided adnexal tenderness  Musculoskeletal: Normal range of motion. She exhibits no edema and no tenderness.  Neurological: She is alert and oriented to person, place, and time.  Skin: Skin is warm and dry.  Psychiatric: She has a normal mood and affect. Her behavior is normal. Judgment and thought content normal.    ED Course  Procedures (including critical care time)  Results for orders placed during the hospital encounter of 06/14/12  OB RESULTS CONSOLE GBS      Result Value Range   GBS Negative    CBC      Result Value Range   WBC 21.6 (*) 4.0 - 10.5 K/uL   RBC 3.22 (*) 3.87 - 5.11 MIL/uL   Hemoglobin 9.8 (*) 12.0 - 15.0 g/dL   HCT 13.2 (*) 44.0 - 10.2 %   MCV 91.3  78.0 - 100.0 fL   MCH 30.4  26.0 - 34.0 pg   MCHC 33.3  30.0 - 36.0 g/dL   RDW 72.5  36.6 - 44.0 %   Platelets 396  150 - 400 K/uL  RPR      Result Value Range   RPR NON REACTIVE  NON REACTIVE  CBC      Result Value Range   WBC 18.4 (*) 4.0 - 10.5 K/uL   RBC 2.86 (*) 3.87 - 5.11 MIL/uL   Hemoglobin 8.6 (*) 12.0 - 15.0 g/dL   HCT 34.7 (*) 42.5 - 95.6 %   MCV 92.0  78.0 - 100.0 fL   MCH 30.1  26.0 - 34.0 pg   MCHC 32.7  30.0 - 36.0 g/dL   RDW 38.7  56.4 - 33.2 %   Platelets 340  150 - 400 K/uL   US Transvaginal Non-ob  11/19/2012  *RADIOLOGY REPORT*  Clinical Data: Abdominal pain and back pain  TRANSABDOMINAL AND TRANSVAGINAL ULTRASOUND OF PELVIS Technique:  Both transabdominal  and transvaginal ultrasound examinations of the pelvis were performed. Transabdominal technique was performed for global  imaging of the pelvis including uterus, ovaries, adnexal regions, and pelvic cul-de-sac.  It was necessary to proceed with endovaginal exam following the transabdominal exam to visualize the ovaries and adnexa.  Comparison:  Pelvic ultrasound 09/08/2010  Findings:  Uterus: The uterus is anteverted and measures 7.8 x 3.7 x 5.7 centimeters. The uterus is normal in echotexture.  No focal uterine mass is identified.  Endometrium: Endometrium measures 4 mm, normal.  Right ovary:  Measures 3.7 x 2.1 x 1.9 cm and contains several follicles.  Negative for mass or cyst.  Color Doppler flow is seen to the right ovary.  Left ovary: The left ovary measures 2.9 x 1.8 x 1.8 cm and contains several follicles.  Negative for cyst or mass.  Color Doppler flow is seen to the left ovary.  Other findings: There is a small amount of free pelvic fluid.  IMPRESSION:  1.  No evidence of pelvic mass or other significant abnormality. 2.  Small amount of free fluid.   Original Report Authenticated By: Britta Mccreedy, M.D.    US Pelvis Complete  11/19/2012  *RADIOLOGY REPORT*  Clinical Data: Abdominal pain and back pain  TRANSABDOMINAL AND TRANSVAGINAL ULTRASOUND OF PELVIS Technique:  Both transabdominal and transvaginal ultrasound examinations of the pelvis were performed. Transabdominal technique was performed for global imaging of the pelvis including uterus, ovaries, adnexal regions, and pelvic cul-de-sac.  It was necessary to proceed with endovaginal exam following the transabdominal exam to visualize the ovaries and adnexa.  Comparison:  Pelvic ultrasound 09/08/2010  Findings:  Uterus: The uterus is anteverted and measures 7.8 x 3.7 x 5.7 centimeters. The uterus is normal in echotexture.  No focal uterine mass is identified.  Endometrium: Endometrium measures 4 mm, normal.  Right ovary:  Measures 3.7 x 2.1 x 1.9 cm and  contains several follicles.  Negative for mass or cyst.  Color Doppler flow is seen to the right ovary.  Left ovary: The left ovary measures 2.9 x 1.8 x 1.8 cm and contains several follicles.  Negative for cyst or mass.  Color Doppler flow is seen to the left ovary.  Other findings: There is a small amount of free pelvic fluid.  IMPRESSION:  1.  No evidence of pelvic mass or other significant abnormality. 2.  Small amount of free fluid.   Original Report Authenticated By: Britta Mccreedy, M.D.       1. BV (bacterial vaginosis)   2. UTI (lower urinary tract infection)     UTI Bacterial vaginosis  MDM  Patient with lower abdominal pain. Will check basic labs, urine pregnancy, and will perform pelvic exam. Will reevaluate.  Pelvic ultrasound reveals no acute abnormality. Patient does have bacterial vaginosis and possibly a UTI. I suspect that this is the cause the patient's symptoms as well as discharge. I will treat her with metronidazole and Keflex. She is afebrile. She is not in any apparent distress. She denies having any vaginal bleeding. She is stable and ready for discharge. [facility] [place] She is stable and ready for discharge.          Roxy Horseman, PA-C 11/19/12 1341

## 2012-11-20 LAB — GC/CHLAMYDIA PROBE AMP: CT Probe RNA: NEGATIVE

## 2012-11-21 LAB — URINE CULTURE: Colony Count: 15000

## 2013-02-10 ENCOUNTER — Emergency Department (HOSPITAL_COMMUNITY): Payer: Self-pay

## 2013-02-10 ENCOUNTER — Emergency Department (HOSPITAL_COMMUNITY)
Admission: EM | Admit: 2013-02-10 | Discharge: 2013-02-10 | Disposition: A | Discharge status: Home / Self Care | Payer: Self-pay | Attending: Emergency Medicine | Admitting: Emergency Medicine

## 2013-02-10 DIAGNOSIS — Z8742 Personal history of other diseases of the female genital tract: Secondary | ICD-10-CM | POA: Insufficient documentation

## 2013-02-10 DIAGNOSIS — Z87891 Personal history of nicotine dependence: Secondary | ICD-10-CM | POA: Insufficient documentation

## 2013-02-10 DIAGNOSIS — O209 Hemorrhage in early pregnancy, unspecified: Secondary | ICD-10-CM | POA: Insufficient documentation

## 2013-02-10 DIAGNOSIS — D649 Anemia, unspecified: Secondary | ICD-10-CM | POA: Insufficient documentation

## 2013-02-10 DIAGNOSIS — Z79899 Other long term (current) drug therapy: Secondary | ICD-10-CM | POA: Insufficient documentation

## 2013-02-10 DIAGNOSIS — N898 Other specified noninflammatory disorders of vagina: Secondary | ICD-10-CM | POA: Insufficient documentation

## 2013-02-10 DIAGNOSIS — Z8669 Personal history of other diseases of the nervous system and sense organs: Secondary | ICD-10-CM | POA: Insufficient documentation

## 2013-02-10 DIAGNOSIS — R109 Unspecified abdominal pain: Secondary | ICD-10-CM | POA: Insufficient documentation

## 2013-02-10 DIAGNOSIS — Z3201 Encounter for pregnancy test, result positive: Secondary | ICD-10-CM | POA: Insufficient documentation

## 2013-02-10 DIAGNOSIS — Z87440 Personal history of urinary (tract) infections: Secondary | ICD-10-CM | POA: Insufficient documentation

## 2013-02-10 LAB — BASIC METABOLIC PANEL
CO2: 25 mEq/L (ref 19–32)
Calcium: 9.6 mg/dL (ref 8.4–10.5)
GFR calc non Af Amer: >90 mL/min (ref >90)
Potassium: 3.3 mEq/L — ABNORMAL LOW (ref 3.5–5.1)
Sodium: 136 mEq/L (ref 135–145)

## 2013-02-10 LAB — URINE MICROSCOPIC-ADD ON

## 2013-02-10 LAB — URINALYSIS, ROUTINE W REFLEX MICROSCOPIC
Bilirubin Urine: NEGATIVE
Glucose, UA: NEGATIVE mg/dL
Ketones, ur: NEGATIVE mg/dL
Protein, ur: NEGATIVE mg/dL
Urobilinogen, UA: 2 mg/dL — ABNORMAL HIGH (ref 0.0–1.0)

## 2013-02-10 LAB — CBC WITH DIFFERENTIAL/PLATELET
Basophils Absolute: 0 10*3/uL (ref 0.0–0.1)
Eosinophils Absolute: 0.2 10*3/uL (ref 0.0–0.7)
Eosinophils Relative: 3 % (ref 0–5)
Lymphocytes Relative: 33 % (ref 12–46)
MCV: 88.4 fL (ref 78.0–100.0)
Neutrophils Relative %: 54 % (ref 43–77)
Platelets: 332 10*3/uL (ref 150–400)
RDW: 13.5 % (ref 11.5–15.5)
WBC: 5.7 10*3/uL (ref 4.0–10.5)

## 2013-02-10 LAB — WET PREP, GENITAL: Clue Cells Wet Prep HPF POC: NONE SEEN

## 2013-02-10 LAB — HCG, QUANTITATIVE, PREGNANCY: hCG, Beta Chain, Quant, S: 5369 m[IU]/mL — ABNORMAL HIGH (ref <5)

## 2013-02-10 MED ORDER — ACETAMINOPHEN 325 MG PO TABS
650.0000 mg | ORAL_TABLET | Freq: Once | ORAL | Status: AC
  Administered 2013-02-10: 650 mg via ORAL
  Filled 2013-02-10: qty 2

## 2013-02-10 NOTE — ED Notes (Signed)
Pt discharge.Vital signs stable and GCS 15. 

## 2013-02-10 NOTE — ED Notes (Signed)
Pt [redacted] weeks pregnant is experiencing mild and heavy cramping and increased bleeding since Saturday.  Denies any other s/s.

## 2013-02-10 NOTE — ED Provider Notes (Signed)
Medical screening examination/treatment/procedure(s) were performed by non-physician practitioner and as supervising physician I was immediately available for consultation/collaboration.   Richardean Canal, MD 02/10/13 [phone]

## 2013-02-10 NOTE — ED Provider Notes (Signed)
CSN: [id number]     Arrival date & time 02/10/13  1041 History     First MD Initiated Contact with Patient 02/10/13 1119     Chief Complaint  Patient presents with  . Vaginal Bleeding   (Consider location/radiation/quality/duration/timing/severity/associated sxs/prior Treatment) HPI  Tonya David is a 27 y.o. female G9P5 (3x medical ab) with 3 positive home pregnancy test. Last menstrual period June 6 no OB/GYN evaluation during this pregnancy. Patient had cramping rated as moderate starting 3 days ago followed by a gloved gush of blood with passage of clots she's been spotting since then she had another episode of cramping today with passage of heavier blood and clots. Patient rates her pain at 5/6 he is midline. Chart review shows that the patient is O+. Patient states she's had blood-tinged toilet paper after wiping for 2 weeks. She denies dysuria, urinary frequency, hematuria, dyspareunia, nausea vomiting, fever, syncope, lightheaded sensation. Patient source is a non-foul-smelling abnormal vaginal discharge. She was treated for bacterial vaginosis 3 months ago, she states she didn't finish the course in took one of the leftover pills to treat the abnormal vaginal discharge last week.   Past Medical History  Diagnosis Date  . Preterm labor   . Anemia   . Urinary tract infection    Past Surgical History  Procedure Laterality Date  . Therapeutic abortion     Family History  Problem Relation Age of Onset  . Alcohol abuse Neg Hx   . Arthritis Neg Hx   . Asthma Neg Hx   . Birth defects Neg Hx   . Cancer Neg Hx   . COPD Neg Hx   . Depression Neg Hx   . Diabetes Neg Hx   . Drug abuse Neg Hx   . Early death Neg Hx   . Hearing loss Neg Hx   . Heart disease Neg Hx   . Hypertension Neg Hx   . Hyperlipidemia Neg Hx   . Kidney disease Neg Hx   . Learning disabilities Neg Hx   . Mental illness Neg Hx   . Mental retardation Neg Hx   . Miscarriages / Stillbirths Neg Hx   .  Stroke Neg Hx   . Vision loss Neg Hx   . Other Neg Hx    History  Substance Use Topics  . Smoking status: Former Smoker    Types: Cigarettes  . Smokeless tobacco: Never Used     Comment: with preg  . Alcohol Use: Yes     Comment: on occasions   OB History   Grav Para Term Preterm Abortions TAB SAB Ect Mult Living   7 5 2 3 2 2  0 0 0 5     Review of Systems 10 systems reviewed and found to be negative, except as noted in the HPI   Allergies  Review of patient's allergies indicates no known allergies.  Home Medications   Current Outpatient Rx  Name  Route  Sig  Dispense  Refill  . aspirin-acetaminophen-caffeine (EXCEDRIN MIGRAINE) 250-250-65 MG per tablet   Oral   Take 2 tablets by mouth every 6 (six) hours as needed for pain.         . cephALEXin (KEFLEX) 500 MG capsule   Oral   Take 1 capsule (500 mg total) by mouth 4 (four) times daily.   40 capsule   0   . metroNIDAZOLE (FLAGYL) 500 MG tablet   Oral   Take 1 tablet (500 mg total) by mouth  2 (two) times daily.   14 tablet   0    BP 126/77  Pulse 96  Temp(Src) 98.3 F (36.8 C) (Oral)  Resp 20  SpO2 100%  LMP 12/13/2012 Physical Exam  Nursing note and vitals reviewed. Constitutional: She is oriented to person, place, and time. She appears well-developed and well-nourished. No distress.  HENT:  Head: Normocephalic.  Mouth/Throat: Oropharynx is clear and moist.  No conjunctival pallor  Eyes: Conjunctivae and EOM are normal.  Cardiovascular: Normal rate, regular rhythm and intact distal pulses.   Pulmonary/Chest: Effort normal and breath sounds normal. No stridor. No respiratory distress. She has no wheezes. She has no rales. She exhibits no tenderness.  Abdominal: Soft. Bowel sounds are normal. She exhibits no distension and no mass. There is no tenderness. There is no rebound and no guarding.  Genitourinary:  Pelvic exam chaperoned by nurse.  There are no external lesions or rashes. Dark blood is  scant and pooled in the posterior fourchette.  No cervical or adnexal tenderness.  Musculoskeletal: Normal range of motion.  Neurological: She is alert and oriented to person, place, and time.  Psychiatric: She has a normal mood and affect.    ED Course   Procedures (including critical care time)  Labs Reviewed - No data to display US Ob Comp Less 14 Wks  02/10/2013   *RADIOLOGY REPORT*  Clinical Data: Vaginal bleeding.  Estimated gestational age by LMP is 8 weeks 3 days.  OBSTETRIC <14 WK [place] AND TRANSVAGINAL OB US  Technique:  Both transabdominal and transvaginal ultrasound examinations were performed for complete evaluation of the gestation as well as the maternal uterus, adnexal regions, and pelvic cul-de-sac.  Transvaginal technique was performed to assess early pregnancy.  Comparison:  Pelvic ultrasound 11/19/2012  Intrauterine gestational sac:  On images 30 and 31, there is a possible early intrauterine gestational sac.  This is not definite. Yolk sac: Not visualized Embryo: Not visualized  Endometrium:  The endometrium is heterogeneous and echogenic and measures up to 2.7 cm.  Heterogeneous echogenicity surrounds the possible early intrauterine gestational sac and could reflect hemorrhage.  Maternal uterus/adnexae: Normal right ovary with a corpus luteum noted.  Normal left ovary. Trace amount of free pelvic fluid.  IMPRESSION:  1.  Possible early intrauterine gestational sac.  The surrounding endometrium is thickened and heterogeneous, which could reflect some subchorionic hemorrhage surrounding the possible early intrauterine gestational sac.  Given the heterogeneous and thickened appearance of the endometrium, the less likely possibility of a molar pregnancy cannot be excluded, particularly if beta HCG levels are higher than expected (beta HCG level not yet available).  Follow-up pelvic ultrasound in 10-14 days is recommended to evaluate for progression of possible early intrauterine pregnancy.   2.  Normal ovaries.   Original Report Authenticated By: Britta Mccreedy, M.D.   US Ob Transvaginal  02/10/2013   *RADIOLOGY REPORT*  Clinical Data: Vaginal bleeding.  Estimated gestational age by LMP is 8 weeks 3 days.  OBSTETRIC <14 WK [place] AND TRANSVAGINAL OB US  Technique:  Both transabdominal and transvaginal ultrasound examinations were performed for complete evaluation of the gestation as well as the maternal uterus, adnexal regions, and pelvic cul-de-sac.  Transvaginal technique was performed to assess early pregnancy.  Comparison:  Pelvic ultrasound 11/19/2012  Intrauterine gestational sac:  On images 30 and 31, there is a possible early intrauterine gestational sac.  This is not definite. Yolk sac: Not visualized Embryo: Not visualized  Endometrium:  The endometrium is heterogeneous and  echogenic and measures up to 2.7 cm.  Heterogeneous echogenicity surrounds the possible early intrauterine gestational sac and could reflect hemorrhage.  Maternal uterus/adnexae: Normal right ovary with a corpus luteum noted.  Normal left ovary. Trace amount of free pelvic fluid.  IMPRESSION:  1.  Possible early intrauterine gestational sac.  The surrounding endometrium is thickened and heterogeneous, which could reflect some subchorionic hemorrhage surrounding the possible early intrauterine gestational sac.  Given the heterogeneous and thickened appearance of the endometrium, the less likely possibility of a molar pregnancy cannot be excluded, particularly if beta HCG levels are higher than expected (beta HCG level not yet available).  Follow-up pelvic ultrasound in 10-14 days is recommended to evaluate for progression of possible early intrauterine pregnancy.  2.  Normal ovaries.   Original Report Authenticated By: Britta Mccreedy, M.D.   1. First trimester bleeding     MDM   Filed Vitals:   02/10/13 1041  BP: 126/77  Pulse: 96  Temp: 98.3 F (36.8 C)  TempSrc: Oral  Resp: 20  SpO2: 100%     Francella L Califano  is a 27 y.o. female home pregnancy test positive with midline lower abdominal cramping and bleeding for the last 3 days. Abdominal exam is nonsurgical. Ultrasound shows questionable early intrauterine gestational sac with normal adnexa, possible right corpus luteum cyst.  It is unclear as clear whether this is a early pregnancy with threatened abortion versus incomplete abortion. Doubt ectopic based on benign abdominal exam and reassuring ultrasound.  OB consult from Dr. Jolayne Panther appreciated: She recommends repeat quantitative beta hCG is performed at [facility] hospital in 48 hours. Agrees this patient is stable for discharge at this time.  Discussed case with attending who agrees with plan and stability to d/c to home.    Medications  acetaminophen (TYLENOL) tablet 650 mg (650 mg Oral Given 02/10/13 1250)    Pt is hemodynamically stable, appropriate for, and amenable to discharge at this time. Pt verbalized understanding and agrees with care plan. All questions answered. Outpatient follow-up and specific return precautions discussed.    Note: Portions of this report may have been transcribed using voice recognition software. Every effort was made to ensure accuracy; however, inadvertent computerized transcription errors may be present    Wynetta Emery, PA-C 02/10/13 1439

## 2013-02-11 LAB — GC/CHLAMYDIA PROBE AMP: CT Probe RNA: NEGATIVE

## 2013-02-12 ENCOUNTER — Inpatient Hospital Stay (HOSPITAL_COMMUNITY)
Admission: AD | Admit: 2013-02-12 | Discharge: 2013-02-12 | Disposition: A | Discharge status: Home / Self Care | Payer: Self-pay | Source: Ambulatory Visit | Attending: Obstetrics & Gynecology | Admitting: Obstetrics & Gynecology

## 2013-02-12 ENCOUNTER — Encounter (HOSPITAL_COMMUNITY): Payer: Self-pay | Admitting: *Deleted

## 2013-02-12 DIAGNOSIS — O039 Complete or unspecified spontaneous abortion without complication: Secondary | ICD-10-CM | POA: Insufficient documentation

## 2013-02-12 HISTORY — DX: Headache: R51

## 2013-02-12 MED ORDER — OXYCODONE-ACETAMINOPHEN 5-325 MG PO TABS: 1.0000 | ORAL_TABLET | ORAL | Status: DC | PRN

## 2013-02-12 MED ORDER — PROMETHAZINE HCL 25 MG PO TABS: 25.0000 mg | ORAL_TABLET | Freq: Four times a day (QID) | ORAL | Status: DC | PRN

## 2013-02-12 MED ORDER — MISOPROSTOL 200 MCG PO TABS: ORAL_TABLET | ORAL | Status: DC

## 2013-02-12 MED ORDER — IBUPROFEN 600 MG PO TABS: 600.0000 mg | ORAL_TABLET | Freq: Four times a day (QID) | ORAL | Status: DC | PRN

## 2013-02-12 NOTE — MAU Provider Note (Signed)
Tonya David is a 27 y.o. [id number] at [redacted]w[redacted]d who presents to MAU today for follow-up quant hCG. The patient was seen at [facility] 2 days ago and diagnosed with threatened miscarriage. She continues to have some bleeding and moderate cramping. She denies fever or N/V. Quant hCG was 5369 at [facility] on 02/10/13  BP 124/66  Pulse 68  Temp(Src) 98.5 F (36.9 C) (Oral)  Resp 18  Ht 5' 5.5" (1.664 m)  Wt 135 lb (61.236 kg)  BMI 22.12 kg/m2  LMP 12/13/2012 GENERAL: Well-developed, well-nourished female in no acute distress.  HEENT: Normocephalic, atraumatic.   LUNGS: Effort normal HEART: Regular rate  SKIN: Warm, dry and without erythema PSYCH: Normal mood and affect  Results for orders placed during the hospital encounter of 02/12/13 (from the past 24 hour(s))  HCG, QUANTITATIVE, PREGNANCY     Status: Abnormal   Collection Time    02/12/13  9:28 AM      Result Value Range   hCG, Beta Chain, Quant, S 5185 (*) <5 mIU/mL   MDM Discussed with Dr. Debroah Loop. Offer patient expectant management vs Cytotec. Follow-up in clinic Discussed options with patient. Patient unsure if she desires Cytotec at this time. Will send Rx and allow her time to choose and discuss with significant other  A: SAB  P: Discharge home Rx for Cytotec, Phenergan, percocet and Ibuprofen given/sent to patient's pharmacy Patient referred to [facility] clinic for follow-up in ~ 2 weeks Bleeding precautions discussed Patient may return to MAU as needed or if her condition were to change or worsen  Freddi Starr, PA-C 02/12/2013 11:51 AM

## 2013-02-12 NOTE — MAU Note (Signed)
Seen at [facility] on the 4th; sent here for follow up. HCG pending.  Continues to cramp "real bad".  Was passing clots last night, none this morning.

## 2013-02-26 ENCOUNTER — Encounter: Payer: Self-pay | Admitting: Family Medicine

## 2013-04-28 ENCOUNTER — Inpatient Hospital Stay (HOSPITAL_COMMUNITY)
Admission: AD | Admit: 2013-04-28 | Discharge: 2013-04-28 | Disposition: A | Discharge status: Home / Self Care | Payer: 59 | Source: Ambulatory Visit | Attending: Obstetrics & Gynecology | Admitting: Obstetrics & Gynecology

## 2013-04-28 ENCOUNTER — Encounter (HOSPITAL_COMMUNITY): Payer: Self-pay | Admitting: General Practice

## 2013-04-28 DIAGNOSIS — N390 Urinary tract infection, site not specified: Secondary | ICD-10-CM | POA: Insufficient documentation

## 2013-04-28 DIAGNOSIS — N949 Unspecified condition associated with female genital organs and menstrual cycle: Secondary | ICD-10-CM | POA: Insufficient documentation

## 2013-04-28 DIAGNOSIS — N938 Other specified abnormal uterine and vaginal bleeding: Secondary | ICD-10-CM | POA: Insufficient documentation

## 2013-04-28 LAB — URINALYSIS, ROUTINE W REFLEX MICROSCOPIC
Glucose, UA: NEGATIVE mg/dL
Leukocytes, UA: NEGATIVE
pH: 6 (ref 5.0–8.0)

## 2013-04-28 LAB — URINE MICROSCOPIC-ADD ON

## 2013-04-28 LAB — HCG, QUANTITATIVE, PREGNANCY: hCG, Beta Chain, Quant, S: 5 m[IU]/mL — ABNORMAL HIGH (ref <5)

## 2013-04-28 LAB — CBC
MCH: 29.6 pg (ref 26.0–34.0)
Platelets: 387 10*3/uL (ref 150–400)
RBC: 3.51 MIL/uL — ABNORMAL LOW (ref 3.87–5.11)
WBC: 8.6 10*3/uL (ref 4.0–10.5)

## 2013-04-28 LAB — POCT PREGNANCY, URINE: Preg Test, Ur: NEGATIVE

## 2013-04-28 MED ORDER — NITROFURANTOIN MONOHYD MACRO 100 MG PO CAPS: 100.0000 mg | ORAL_CAPSULE | Freq: Two times a day (BID) | ORAL | Status: DC

## 2013-04-28 NOTE — MAU Provider Note (Signed)
History     CSN: [id number]  Arrival date and time: 04/28/13 1919   First Provider Initiated Contact with Patient 04/28/13 2203      Chief Complaint  Patient presents with  . Vaginal Bleeding   Vaginal Bleeding    Tonya David is a 27 y.o. [phone] who presents today with continued bleeding after miscarriage. She states that she took Cytotec for a miscarriage in August, and she passed several large clots one day. She is unsure if she passed tissue specifically. However, she has continued to have bleeding since then. She states that it varies from brown, to pink  And from spotting to heavy. She states that for the last two days it has been heavier, like a period.   Past Medical History  Diagnosis Date  . Preterm labor   . Anemia   . Urinary tract infection   . ZHYQMVHQ(469.6)     Past Surgical History  Procedure Laterality Date  . Therapeutic abortion      Family History  Problem Relation Age of Onset  . Alcohol abuse Neg Hx   . Arthritis Neg Hx   . Asthma Neg Hx   . Birth defects Neg Hx   . COPD Neg Hx   . Depression Neg Hx   . Diabetes Neg Hx   . Drug abuse Neg Hx   . Early death Neg Hx   . Hearing loss Neg Hx   . Heart disease Neg Hx   . Hypertension Neg Hx   . Hyperlipidemia Neg Hx   . Kidney disease Neg Hx   . Learning disabilities Neg Hx   . Mental illness Neg Hx   . Mental retardation Neg Hx   . Miscarriages / Stillbirths Neg Hx   . Stroke Neg Hx   . Vision loss Neg Hx   . Other Neg Hx   . Cancer Maternal Grandmother   . Cancer Maternal Grandfather     History  Substance Use Topics  . Smoking status: Current Every Day Smoker -- 0.25 packs/day for 4 years    Types: Cigarettes  . Smokeless tobacco: Never Used  . Alcohol Use: Yes     Comment: on occasions    Allergies: No Known Allergies  Prescriptions prior to admission  Medication Sig Dispense Refill  . aspirin-acetaminophen-caffeine (EXCEDRIN MIGRAINE) 250-250-65 MG per tablet Take 2  tablets by mouth every 6 (six) hours as needed for pain.        Review of Systems  Genitourinary: Positive for vaginal bleeding.   Physical Exam   Blood pressure 130/67, pulse 92, temperature 98.1 F (36.7 C), temperature source Oral, resp. rate 20, height 5\' 5"  (1.651 m), weight 62.766 kg (138 lb 6 oz), last menstrual period 12/13/2012, unknown if currently breastfeeding.  Physical Exam  Nursing note and vitals reviewed. Constitutional: She is oriented to person, place, and time. She appears well-developed and well-nourished. No distress.  Cardiovascular: Normal rate.   Respiratory: Effort normal.  GI: Soft. There is no tenderness.  Genitourinary:   External: no lesion Vagina: moderate amount of BRB seen, no tissue or POCs seen.  Cervix: pink, smooth, no CMT Uterus: NSSC Adnexa: NT   Neurological: She is alert and oriented to person, place, and time.  Skin: Skin is warm and dry.  Psychiatric: She has a normal mood and affect.    MAU Course  Procedures  Results for orders placed during the hospital encounter of 04/28/13 (from the past 24 hour(s))  URINALYSIS, ROUTINE  W REFLEX MICROSCOPIC     Status: Abnormal   Collection Time    04/28/13  8:11 PM      Result Value Range   Color, Urine YELLOW  YELLOW   APPearance CLEAR  CLEAR   Specific Gravity, Urine 1.025  1.005 - 1.030   pH 6.0  5.0 - 8.0   Glucose, UA NEGATIVE  NEGATIVE mg/dL   Hgb urine dipstick SMALL (*) NEGATIVE   Bilirubin Urine NEGATIVE  NEGATIVE   Ketones, ur NEGATIVE  NEGATIVE mg/dL   Protein, ur NEGATIVE  NEGATIVE mg/dL   Urobilinogen, UA 0.2  0.0 - 1.0 mg/dL   Nitrite POSITIVE (*) NEGATIVE   Leukocytes, UA NEGATIVE  NEGATIVE  URINE MICROSCOPIC-ADD ON     Status: Abnormal   Collection Time    04/28/13  8:11 PM      Result Value Range   Squamous Epithelial / LPF RARE  RARE   WBC, UA 3-6  <3 WBC/hpf   RBC / HPF 0-2  <3 RBC/hpf   Bacteria, UA MANY (*) RARE   Urine-Other MUCOUS PRESENT    POCT  PREGNANCY, URINE     Status: None   Collection Time    04/28/13  9:04 PM      Result Value Range   Preg Test, Ur NEGATIVE  NEGATIVE  HCG, QUANTITATIVE, PREGNANCY     Status: Abnormal   Collection Time    04/28/13  9:35 PM      Result Value Range   hCG, Beta Chain, Quant, S 5 (*) <5 mIU/mL  CBC     Status: Abnormal   Collection Time    04/28/13  9:35 PM      Result Value Range   WBC 8.6  4.0 - 10.5 K/uL   RBC 3.51 (*) 3.87 - 5.11 MIL/uL   Hemoglobin 10.4 (*) 12.0 - 15.0 g/dL   HCT 54.0 (*) 98.1 - 19.1 %   MCV 88.0  78.0 - 100.0 fL   MCH 29.6  26.0 - 34.0 pg   MCHC 33.7  30.0 - 36.0 g/dL   RDW 47.8  29.5 - 62.1 %   Platelets 387  150 - 400 K/uL     Assessment and Plan   1. UTI (lower urinary tract infection)    Will repeat HCG in 48 hours  D/W patient about the possibility of retained products v a new pregnancy Danger signs reviewed and ectopic precautions given   [name] 04/28/2013, 10:07 PM

## 2013-04-28 NOTE — MAU Note (Addendum)
PT SAYS SHE  STARTED HAVING A SAB ON 8-2- STARTED BLEEDING,     SHE WENT  TO [facility]-  THEY SENT HER HERE,      HERE - WE DREW LABS -  TOLD HER  SHE WAS HAVING A SAB-  GAVE HER  MEDS TO INSERT,   NOW SHE SAYS  SHE HAS BEEN BLEEDING EVER SINCE-  SPOTTING/  HEAVY-  SOMETHING EVERYDAY.     CRAMPS  STARTED  Saturday AND PASSING CLOTS.      NO DR.   LAST SEX- 10-14..   IN TRIAGE -  TOILET PAPER-  SMALL AMT SPOTTING..  NO BIRTH CONTROL.

## 2013-04-28 NOTE — MAU Note (Signed)
Pt presents to MAU with c/o vaginal bleeding since August after a miscarriage that was treated with cytotec. Pt states she has been bleeding everyday in differing amounts. Sometimes it's a lot and sometimes a little. Passed a clot on Saturday and a couple of clots today. Pt states the clots are the size of a small golfball.

## 2013-04-30 LAB — URINE CULTURE

## 2013-05-03 ENCOUNTER — Inpatient Hospital Stay (HOSPITAL_COMMUNITY)
Admission: AD | Admit: 2013-05-03 | Discharge: 2013-05-03 | Disposition: A | Discharge status: Home / Self Care | Payer: 59 | Source: Ambulatory Visit | Attending: Obstetrics & Gynecology | Admitting: Obstetrics & Gynecology

## 2013-05-03 ENCOUNTER — Encounter (HOSPITAL_COMMUNITY): Payer: Self-pay | Admitting: *Deleted

## 2013-05-03 ENCOUNTER — Inpatient Hospital Stay (HOSPITAL_COMMUNITY): Payer: 59

## 2013-05-03 DIAGNOSIS — N938 Other specified abnormal uterine and vaginal bleeding: Secondary | ICD-10-CM | POA: Insufficient documentation

## 2013-05-03 DIAGNOSIS — N949 Unspecified condition associated with female genital organs and menstrual cycle: Secondary | ICD-10-CM | POA: Insufficient documentation

## 2013-05-03 DIAGNOSIS — N390 Urinary tract infection, site not specified: Secondary | ICD-10-CM

## 2013-05-03 DIAGNOSIS — R109 Unspecified abdominal pain: Secondary | ICD-10-CM | POA: Insufficient documentation

## 2013-05-03 LAB — HCG, QUANTITATIVE, PREGNANCY: hCG, Beta Chain, Quant, S: <1 m[IU]/mL (ref <5)

## 2013-05-03 LAB — URINALYSIS, ROUTINE W REFLEX MICROSCOPIC
Bilirubin Urine: NEGATIVE
Nitrite: POSITIVE — AB
Specific Gravity, Urine: 1.025 (ref 1.005–1.030)
Urobilinogen, UA: 0.2 mg/dL (ref 0.0–1.0)

## 2013-05-03 LAB — URINE MICROSCOPIC-ADD ON

## 2013-05-03 MED ORDER — NORGESTIMATE-ETH ESTRADIOL 0.25-35 MG-MCG PO TABS: 1.0000 | ORAL_TABLET | Freq: Every day | ORAL | Status: DC

## 2013-05-03 MED ORDER — ACETAMINOPHEN 325 MG PO TABS
650.0000 mg | ORAL_TABLET | Freq: Once | ORAL | Status: AC
  Administered 2013-05-03: 650 mg via ORAL
  Filled 2013-05-03: qty 2

## 2013-05-03 NOTE — MAU Note (Signed)
I had SAB on 8/2 and have been bleeding since then. Recently started having cramps and today cramping feels like contractions. Using 2 pads daily as bleeding is not heavy but do have some clots that are quarter size

## 2013-05-03 NOTE — MAU Provider Note (Signed)
History     CSN: [id number]  Arrival date and time: 05/03/13 4098   First Provider Initiated Contact with Patient 05/03/13 2049      Chief Complaint  Patient presents with  . Abdominal Cramping  . Vaginal Bleeding   HPI THis is a 27 y.o. female who presents with persistent uterine cramping which has gotten worse since the 20th.  Still not bleeding much, just spotting with occasional clots. No fever. Wants to know why she is still bleeding. Did get Cytotec in August, but never had the bleeding and cramping they told her she would have.   RN Note: I had SAB on 8/2 and have been bleeding since then. Recently started having cramps and today cramping feels like contractions. Using 2 pads daily as bleeding is not heavy but do have some clots that are quarter size  Prior MAU visit from 04/28/13: Tonya David is a 27 y.o. [id number] who presents today with continued bleeding after miscarriage. She states that she took Cytotec for a miscarriage in August, and she passed several large clots one day. She is unsure if she passed tissue specifically. However, she has continued to have bleeding since then. She states that it varies from brown, to pink And from spotting to heavy. She states that for the last two days it has been heavier, like a period.    hCG, Beta Chain, Quant, S  5 (*)  <5 mIU/mL   CBC Status: Abnormal    Collection Time    04/28/13 9:35 PM   Result  Value  Range    WBC  8.6  4.0 - 10.5 K/uL    RBC  3.51 (*)  3.87 - 5.11 MIL/uL    Hemoglobin  10.4 (*)  12.0 - 15.0 g/dL    HCT  29.5 (*)  62.1 - 46.0 %    MCH  29.6  26.0 - 34.0 pg    MCHC  33.7  30.0 - 36.0 g/dL    RDW  30.8  65.7 - 84.6 %    Platelets  387  150 - 400 K/uL    Assessment and Plan    1.  UTI (lower urinary tract infection)    Will repeat HCG in 48 hours  D/W patient about the possibility of retained products v a new pregnancy  Danger signs reviewed and ectopic precautions given  OB History   Grav  Para Term Preterm Abortions TAB SAB Ect Mult Living   8 5 2 3 2 2  0 0 0 5      Past Medical History  Diagnosis Date  . Preterm labor   . Anemia   . Urinary tract infection   . NGEXBMWU(132.4)     Past Surgical History  Procedure Laterality Date  . Therapeutic abortion      Family History  Problem Relation Age of Onset  . Alcohol abuse Neg Hx   . Arthritis Neg Hx   . Asthma Neg Hx   . Birth defects Neg Hx   . COPD Neg Hx   . Depression Neg Hx   . Diabetes Neg Hx   . Drug abuse Neg Hx   . Early death Neg Hx   . Hearing loss Neg Hx   . Heart disease Neg Hx   . Hypertension Neg Hx   . Hyperlipidemia Neg Hx   . Kidney disease Neg Hx   . Learning disabilities Neg Hx   . Mental illness Neg Hx   . Mental  retardation Neg Hx   . Miscarriages / Stillbirths Neg Hx   . Stroke Neg Hx   . Vision loss Neg Hx   . Other Neg Hx   . Cancer Maternal Grandmother   . Cancer Maternal Grandfather     History  Substance Use Topics  . Smoking status: Current Every Day Smoker -- 0.25 packs/day for 4 years    Types: Cigarettes  . Smokeless tobacco: Never Used  . Alcohol Use: Yes     Comment: on occasions    Allergies: No Known Allergies  Prescriptions prior to admission  Medication Sig Dispense Refill  . aspirin-acetaminophen-caffeine (EXCEDRIN MIGRAINE) 250-250-65 MG per tablet Take 2 tablets by mouth every 6 (six) hours as needed for pain.      . nitrofurantoin, macrocrystal-monohydrate, (MACROBID) 100 MG capsule Take 1 capsule (100 mg total) by mouth 2 (two) times daily.  14 capsule  0    Review of Systems  Constitutional: Negative for fever, chills and malaise/fatigue.  Gastrointestinal: Positive for abdominal pain. Negative for nausea, vomiting, diarrhea and constipation.   Physical Exam   Temperature 98.5 F (36.9 C), resp. rate 18, height 5\' 6"  (1.676 m), weight 135 lb 3.2 oz (61.326 kg), last menstrual period 12/13/2012.  Physical Exam  Constitutional: She is  oriented to person, place, and time. She appears well-developed and well-nourished. No distress.  HENT:  Head: Normocephalic.  Cardiovascular: Normal rate.   Respiratory: Effort normal.  GI: Soft. She exhibits no distension and no mass. There is no tenderness. There is no rebound and no guarding.  Musculoskeletal: Normal range of motion.  Neurological: She is alert and oriented to person, place, and time.  Skin: Skin is warm and dry.  Psychiatric: She has a normal mood and affect.    MAU Course  Procedures  MDM Discussed with pt that she probably did not pass all the pregnancy tissue in August.  Will repeat Quant tonight and get an ultrasound.  Assessment and Plan  Report to oncoming CNM  [facility] 05/03/2013, 8:57 PM   Results for orders placed during the hospital encounter of 05/03/13 (from the past 24 hour(s))  URINALYSIS, ROUTINE W REFLEX MICROSCOPIC     Status: Abnormal   Collection Time    05/03/13  8:17 PM      Result Value Range   Color, Urine YELLOW  YELLOW   APPearance CLEAR  CLEAR   Specific Gravity, Urine 1.025  1.005 - 1.030   pH 6.5  5.0 - 8.0   Glucose, UA NEGATIVE  NEGATIVE mg/dL   Hgb urine dipstick MODERATE (*) NEGATIVE   Bilirubin Urine NEGATIVE  NEGATIVE   Ketones, ur 15 (*) NEGATIVE mg/dL   Protein, ur NEGATIVE  NEGATIVE mg/dL   Urobilinogen, UA 0.2  0.0 - 1.0 mg/dL   Nitrite POSITIVE (*) NEGATIVE   Leukocytes, UA NEGATIVE  NEGATIVE  URINE MICROSCOPIC-ADD ON     Status: Abnormal   Collection Time    05/03/13  8:17 PM      Result Value Range   Squamous Epithelial / LPF RARE  RARE   WBC, UA 3-6  <3 WBC/hpf   RBC / HPF 3-6  <3 RBC/hpf   Bacteria, UA MANY (*) RARE   Urine-Other MUCOUS PRESENT    POCT PREGNANCY, URINE     Status: None   Collection Time    05/03/13  8:25 PM      Result Value Range   Preg Test, Ur NEGATIVE  NEGATIVE  HCG, QUANTITATIVE,  PREGNANCY     Status: None   Collection Time    05/03/13  8:51 PM      Result Value Range    hCG, Beta Chain, Quant, S <1  <5 mIU/mL   Ultrasound:   IMPRESSION:  Poor definition of the endometrium. No definite retained products of  conception. Heterogeneous material in the uterine canal likely  represents clot.  A: Dysfunctional Uterine Bleeding UTI  P: Discharge to home Take antibiotic previously prescribed Abstain from sex x two weeks, take pregnancy test.  If continues to be negative, begins OCPs  [facility]

## 2013-05-06 LAB — URINE CULTURE

## 2013-05-08 NOTE — MAU Provider Note (Signed)
Attestation of Attending Supervision of Advanced Practitioner (PA/CNM/NP): Evaluation and management procedures were performed by the Advanced Practitioner under my supervision and collaboration.  I have reviewed the Advanced Practitioner's note and chart, and I agree with the management and plan.  Vonnetta Akey, MD, FACOG Attending Obstetrician & Gynecologist Faculty Practice, [facility]  

## 2013-07-10 NOTE — L&D Delivery Note (Signed)
Delivery Note At 5:05 PM a viable female was delivered via Vaginal, Spontaneous Delivery (Presentation: ;  ).  APGAR: , ; weight .   Placenta status: , .  Cord:  with the following complications: .  Cord pH: not done  Anesthesia:   Episiotomy:  Lacerations:  Suture Repair: 2.0 Est. Blood Loss (mL):   Mom to postpartum.  Baby to Couplet care / Skin to Skin.  Llesenia Fogal A 04/09/2014, 5:15 PM

## 2013-07-12 ENCOUNTER — Encounter (HOSPITAL_COMMUNITY): Payer: Self-pay | Admitting: Emergency Medicine

## 2013-07-12 ENCOUNTER — Emergency Department (HOSPITAL_COMMUNITY)
Admission: EM | Admit: 2013-07-12 | Discharge: 2013-07-12 | Disposition: A | Discharge status: Home / Self Care | Payer: 59 | Source: Home / Self Care

## 2013-07-12 DIAGNOSIS — IMO0001 Reserved for inherently not codable concepts without codable children: Secondary | ICD-10-CM

## 2013-07-12 DIAGNOSIS — S40011A Contusion of right shoulder, initial encounter: Principal | ICD-10-CM

## 2013-07-12 DIAGNOSIS — S40021A Contusion of right upper arm, initial encounter: Principal | ICD-10-CM

## 2013-07-12 DIAGNOSIS — S40019A Contusion of unspecified shoulder, initial encounter: Secondary | ICD-10-CM

## 2013-07-12 MED ORDER — TRAMADOL HCL 50 MG PO TABS: 50.0000 mg | ORAL_TABLET | Freq: Four times a day (QID) | ORAL | Status: DC | PRN

## 2013-07-12 MED ORDER — PREDNISONE 10 MG PO KIT: PACK | ORAL | Status: DC

## 2013-07-12 MED ORDER — PREDNISONE (PAK) 10 MG PO TABS
ORAL_TABLET | Freq: Every day | ORAL | Status: DC
Start: 2013-07-12 — End: 2013-07-12

## 2013-07-12 NOTE — Discharge Instructions (Signed)
Contusion A contusion is a deep bruise. Contusions happen when an injury causes bleeding under the skin. Signs of bruising include pain, puffiness (swelling), and discolored skin. The contusion may turn blue, purple, or yellow. HOME CARE   Put ice on the injured area.  Put ice in a plastic bag.  Place a towel between your skin and the bag.  Leave the ice on for 15-20 minutes, 03-04 times a day.  Only take medicine as told by your doctor.  Rest the injured area.  If possible, raise (elevate) the injured area to lessen puffiness. GET HELP RIGHT AWAY IF:   You have more bruising or puffiness.  You have pain that is getting worse.  Your puffiness or pain is not helped by medicine. MAKE SURE YOU:   Understand these instructions.  Will watch your condition.  Will get help right away if you are not doing well or get worse. Document Released: 12/13/2007 Document Revised: 09/18/2011 Document Reviewed: 05/01/2011 [facility] Patient Information 2014 [place], [place]. RICE: Routine Care for Injuries The routine care of many injuries includes Rest, Ice, Compression, and Elevation (RICE). HOME CARE INSTRUCTIONS  Rest is needed to allow your body to heal. Routine activities can usually be resumed when comfortable. Injured tendons and bones can take up to 6 weeks to heal. Tendons are the cord-like structures that attach muscle to bone.  Ice following an injury helps keep the swelling down and reduces pain.  Put ice in a plastic bag.  Place a towel between your skin and the bag.  Leave the ice on for 15-20 minutes, 03-04 times a day. Do this while awake, for the first 24 to 48 hours. After that, continue as directed by your caregiver.  Compression helps keep swelling down. It also gives support and helps with discomfort. If an elastic bandage has been applied, it should be removed and reapplied every 3 to 4 hours. It should not be applied tightly, but firmly enough to keep swelling down.  Watch fingers or toes for swelling, bluish discoloration, coldness, numbness, or excessive pain. If any of these problems occur, remove the bandage and reapply loosely. Contact your caregiver if these problems continue.  Elevation helps reduce swelling and decreases pain. With extremities, such as the arms, hands, legs, and feet, the injured area should be placed near or above the level of the heart, if possible. SEEK IMMEDIATE MEDICAL CARE IF:  You have persistent pain and swelling.  You develop redness, numbness, or unexpected weakness.  Your symptoms are getting worse rather than improving after several days. These symptoms may indicate that further evaluation or further X-rays are needed. Sometimes, X-rays may not show a small broken bone (fracture) until 1 week or 10 days later. Make a follow-up appointment with your caregiver. Ask when your X-ray results will be ready. Make sure you get your X-ray results. Document Released: 10/08/2000 Document Revised: 09/18/2011 Document Reviewed: 11/25/2010 [facility] LPExitCare Patient Information 2014 [place], [place].

## 2013-07-12 NOTE — ED Provider Notes (Signed)
Limited musculoskeletal ultrasound of the right shoulder: Biceps tendon is intact and normal appearing in the bicipital groove. Subscapularis tendon is normal-appearing. Supraspinatus tendon is normal-appearing. No significant subacromial bursitis. Infraspinatus is normal-appearing. The transducer was placed overlying the mass on the lateral upper arm. The muscle fibers are intact but there is increased homogeneity of the return signal. This is consistent with contusion.   Rodolph BongEvan S Corey, MD 07/12/13 1539

## 2013-07-12 NOTE — ED Provider Notes (Signed)
CSN: [id number]     Arrival date & time 07/12/13  1114 History   None    Chief Complaint  Patient presents with  . Arm Pain   (Consider location/radiation/quality/duration/timing/severity/associated sxs/prior Treatment)  HPI  The patient presents today with right anterior and lateral  shoulder pain.  Patient states she fell up the stairs approximately 3 weeks ago onto her right shoulder. Patient states that it was sore but progressively got "better".  Patient now reports intermittent numbness and tingling especially while sleeping and decreased range of motion to the right shoulder.  Past Medical History  Diagnosis Date  . Preterm labor   . Anemia   . Urinary tract infection   . XIDHWYSH(683.7)    Past Surgical History  Procedure Laterality Date  . Therapeutic abortion     Family History  Problem Relation Age of Onset  . Alcohol abuse Neg Hx   . Arthritis Neg Hx   . Asthma Neg Hx   . Birth defects Neg Hx   . COPD Neg Hx   . Depression Neg Hx   . Diabetes Neg Hx   . Drug abuse Neg Hx   . Early death Neg Hx   . Hearing loss Neg Hx   . Heart disease Neg Hx   . Hypertension Neg Hx   . Hyperlipidemia Neg Hx   . Kidney disease Neg Hx   . Learning disabilities Neg Hx   . Mental illness Neg Hx   . Mental retardation Neg Hx   . Miscarriages / Stillbirths Neg Hx   . Stroke Neg Hx   . Vision loss Neg Hx   . Other Neg Hx   . Cancer Maternal Grandmother   . Cancer Maternal Grandfather    History  Substance Use Topics  . Smoking status: Current Every Day Smoker -- 0.25 packs/day for 4 years    Types: Cigarettes  . Smokeless tobacco: Never Used  . Alcohol Use: Yes     Comment: on occasions   OB History   Grav Para Term Preterm Abortions TAB SAB Ect Mult Living   '8 5 2 3 2 2 '$ 0 0 0 5     Review of Systems  Constitutional: Negative.   HENT: Negative.   Eyes: Negative.   Respiratory: Negative.   Cardiovascular: Negative.   Gastrointestinal: Negative.   Endocrine:  Negative.   Genitourinary: Negative.   Musculoskeletal:       R shoulder pain, particularly with movement.  Skin: Negative.   Allergic/Immunologic: Negative.   Neurological: Negative for dizziness and weakness.       Reports "pins and needles" sensation in right arm intermittently.  Hematological: Negative.   Psychiatric/Behavioral: Negative.     Allergies  Review of patient's allergies indicates no known allergies.  Home Medications   Current Outpatient Rx  Name  Route  Sig  Dispense  Refill  . aspirin-acetaminophen-caffeine (EXCEDRIN MIGRAINE) 250-250-65 MG per tablet   Oral   Take 2 tablets by mouth every 6 (six) hours as needed for pain.         . nitrofurantoin, macrocrystal-monohydrate, (MACROBID) 100 MG capsule   Oral   Take 1 capsule (100 mg total) by mouth 2 (two) times daily.   14 capsule   0   . norgestimate-ethinyl estradiol (ORTHO-CYCLEN,SPRINTEC,PREVIFEM) 0.25-35 MG-MCG tablet   Oral   Take 1 tablet by mouth daily.   1 Package   2   . PredniSONE 10 MG KIT      12  day dose pack   1 kit   0   . traMADol (ULTRAM) 50 MG tablet   Oral   Take 1 tablet (50 mg total) by mouth every 6 (six) hours as needed.   15 tablet   0    BP 112/70  Pulse 72  Temp(Src) 98.3 F (36.8 C) (Oral)  Resp 18  SpO2 100%  LMP 07/06/2013  Physical Exam  Nursing note and vitals reviewed. Constitutional: She is oriented to person, place, and time. She appears well-developed and well-nourished. No distress.  HENT:  Head: Normocephalic and atraumatic.  Eyes: EOM are normal. Pupils are equal, round, and reactive to light. No scleral icterus.  Neck: Normal range of motion. Neck supple.  No nuchal rigidity, negative Spurling.  Cardiovascular: Normal rate, regular rhythm, normal heart sounds and intact distal pulses.  Exam reveals no gallop and no friction rub.   No murmur heard. Pulmonary/Chest: Effort normal and breath sounds normal.  Musculoskeletal: She exhibits  tenderness.       Right shoulder: She exhibits tenderness, swelling and pain. She exhibits no bony tenderness, no deformity, normal pulse and normal strength.       Arms: Has decreased active range of motion secondary to right shoulder discomfort.  Passive range of motion intact.  The deltoid muscle feels tight and tender to light palpation.  Lymphadenopathy:    She has no cervical adenopathy.  Neurological: She is alert and oriented to person, place, and time. She has normal strength and normal reflexes. She displays no tremor. No cranial nerve deficit or sensory deficit. She exhibits normal muscle tone.  Reflex Scores:      Bicep reflexes are 2+ on the right side and 2+ on the left side.      Brachioradialis reflexes are 2+ on the right side and 2+ on the left side.      Patellar reflexes are 2+ on the right side and 2+ on the left side. Renal nerves II through XII grossly intact.   Skin: Skin is warm and dry. She is not diaphoretic.    ED Course  Procedures (including critical care time) Labs Review Labs Reviewed - No data to display Imaging Review No results found.   MDM   1. Contusion shoulder/arm, right, initial encounter    Meds ordered this encounter  Medications  . DISCONTD: predniSONE (STERAPRED UNI-PAK) 10 MG tablet    Sig: Take by mouth daily. Day 1 take 6 tablets ($RemoveBe'60mg'EeNSpywhD$ ) Day 2 take 6 tablets ($RemoveBe'60mg'McCTzBpkf$ ) Day 3 take 5 tablets ($RemoveBe'50mg'xHtWzUwUQ$ ) Day 4 take 5 tablets ($RemoveBe'50mg'asLTRXGKJ$ ) Day 5 take 4 tablets ($RemoveBe'40mg'joZjpkZUT$ ) Day 6 take 4 tablets ($RemoveBe'40mg'eUHsUUPJX$ ) Day 7 take 3 tablets ($RemoveBe'30mg'vhZGnqbud$ ) Day 8 take 3 tablets ($RemoveBe'30mg'ZPWxIZoCE$ ) Day 9 take 2 tablets ($RemoveBe'20mg'cfEamzRkm$ ) Day 10 take 2 tablets ($RemoveBe'20mg'UzrbxiQix$ ) Day 11 take 1 tablet  ($Remove'10mg'cBoZkvD$ ) Day 12 take 1 tablet  ($Remove'10mg'uvvpQAU$ )    Dispense:  42 tablet    Refill:  0  . DISCONTD: traMADol (ULTRAM) 50 MG tablet    Sig: Take 1 tablet (50 mg total) by mouth every 6 (six) hours as needed.    Dispense:  15 tablet    Refill:  0  . traMADol (ULTRAM) 50 MG tablet    Sig: Take 1 tablet (50 mg total) by mouth every 6  (six) hours as needed.    Dispense:  15 tablet    Refill:  0  . PredniSONE 10 MG KIT    Sig: 12 day dose pack    Dispense:  1 kit    Refill:  0   Plan of care discussed with Dr. Georgina Snell.      Jacqualyn Posey, NP 07/12/13 [phone]

## 2013-07-12 NOTE — ED Notes (Signed)
C/o right arm for a week now States unable to lift arm Has numbness, tingling in arm Pain did stop for 2/3 days but continued Admits to falling on arm three weeks ago  Denies bruising and swelling

## 2013-07-13 NOTE — ED Provider Notes (Signed)
Medical screening examination/treatment/procedure(s) were performed by a resident physician or non-physician practitioner and as the supervising physician I was immediately available for consultation/collaboration.  Clementeen GrahamEvan Pernell Dikes, MD   Rodolph BongEvan S Eamonn Sermeno, MD 07/13/13 [phone]

## 2013-12-05 ENCOUNTER — Inpatient Hospital Stay (HOSPITAL_COMMUNITY)
Admission: AD | Admit: 2013-12-05 | Discharge: 2013-12-05 | Disposition: A | Discharge status: Home / Self Care | Payer: 59 | Source: Ambulatory Visit | Attending: Obstetrics | Admitting: Obstetrics

## 2013-12-05 ENCOUNTER — Encounter (HOSPITAL_COMMUNITY): Payer: Self-pay | Admitting: *Deleted

## 2013-12-05 DIAGNOSIS — N949 Unspecified condition associated with female genital organs and menstrual cycle: Secondary | ICD-10-CM | POA: Insufficient documentation

## 2013-12-05 DIAGNOSIS — O99891 Other specified diseases and conditions complicating pregnancy: Secondary | ICD-10-CM | POA: Insufficient documentation

## 2013-12-05 DIAGNOSIS — R109 Unspecified abdominal pain: Secondary | ICD-10-CM | POA: Insufficient documentation

## 2013-12-05 DIAGNOSIS — O99330 Smoking (tobacco) complicating pregnancy, unspecified trimester: Secondary | ICD-10-CM | POA: Insufficient documentation

## 2013-12-05 DIAGNOSIS — O9989 Other specified diseases and conditions complicating pregnancy, childbirth and the puerperium: Principal | ICD-10-CM

## 2013-12-05 LAB — URINALYSIS, ROUTINE W REFLEX MICROSCOPIC
BILIRUBIN URINE: NEGATIVE
Glucose, UA: NEGATIVE mg/dL
Hgb urine dipstick: NEGATIVE
KETONES UR: NEGATIVE mg/dL
Nitrite: NEGATIVE
PH: 6 (ref 5.0–8.0)
PROTEIN: NEGATIVE mg/dL
Specific Gravity, Urine: >1.03 — ABNORMAL HIGH (ref 1.005–1.030)
Urobilinogen, UA: 0.2 mg/dL (ref 0.0–1.0)

## 2013-12-05 LAB — URINE MICROSCOPIC-ADD ON

## 2013-12-05 NOTE — MAU Note (Addendum)
PT SAYS SHE STARTED HAVING   CRAMPS SINCE 3-16-  SINCE  SHE IS OUT ON LEAVE  FOR WORK-  CRAMPS COME/ GOES.  CRAMPS  WORSE  ON 5-8-  CONSTANT.    WAS SEEN LAST  AT [facility] - ON 4-30 - HAD LABS AND U/S  DONE-- -  SHE HAS TRANSFERRED TO DR MARSHALL -  BUT HAS NOT TRANSFERRED  HER RECORDS-   NOT SEEN YET-  , FIRST APPOINTMENT IS 6-3- WED.    SO NOW  KINDA NO DR.      NO MEDS FOR CRAMPS.   LAST SEX-  WED NIGHT.

## 2013-12-05 NOTE — MAU Note (Signed)
PO PITCHER OF WATER.

## 2013-12-05 NOTE — Discharge Instructions (Signed)

## 2013-12-05 NOTE — MAU Provider Note (Signed)
History     CSN: [id number]  Arrival date and time: 12/05/13 2022   First Provider Initiated Contact with Patient 12/05/13 2100      No chief complaint on file.  HPI  Tonya David is a 28 y.o. [id number] at 18 weeks who presents today with cramping. She denies any VB or LOF. She states that she has had this cramping since March. She has not called her doctors office about it at this time. She has been seen at [facility], but has an appointment with Dr. Gaynell Face next week. She states that she has been feeling the baby moving. She denies any vaginal discharge. She denies any pain with urination or fever.   Past Medical History  Diagnosis Date  . Preterm labor   . Anemia   . Urinary tract infection   . GSUPJSRP(594.5)     Past Surgical History  Procedure Laterality Date  . Therapeutic abortion      Family History  Problem Relation Age of Onset  . Alcohol abuse Neg Hx   . Arthritis Neg Hx   . Asthma Neg Hx   . Birth defects Neg Hx   . COPD Neg Hx   . Depression Neg Hx   . Diabetes Neg Hx   . Drug abuse Neg Hx   . Early death Neg Hx   . Hearing loss Neg Hx   . Heart disease Neg Hx   . Hypertension Neg Hx   . Hyperlipidemia Neg Hx   . Kidney disease Neg Hx   . Learning disabilities Neg Hx   . Mental illness Neg Hx   . Mental retardation Neg Hx   . Miscarriages / Stillbirths Neg Hx   . Stroke Neg Hx   . Vision loss Neg Hx   . Other Neg Hx   . Cancer Maternal Grandmother   . Cancer Maternal Grandfather     History  Substance Use Topics  . Smoking status: Current Every Day Smoker -- 0.25 packs/day for 4 years    Types: Cigarettes  . Smokeless tobacco: Never Used  . Alcohol Use: Yes     Comment: on occasionsNONE SINCE PREG    Allergies: No Known Allergies  No prescriptions prior to admission    ROS Physical Exam   Blood pressure 109/61, pulse 98, temperature 98 F (36.7 C), temperature source Oral, resp. rate 20, height 5\' 6"  (1.676 m), weight 63.617 kg  (140 lb 4 oz).  Physical Exam  Nursing note and vitals reviewed. Constitutional: She is oriented to person, place, and time. She appears well-developed and well-nourished. No distress.  Cardiovascular: Normal rate.   Respiratory: Effort normal.  GI: Soft. There is no tenderness. There is no rebound and no guarding.  Neurological: She is alert and oriented to person, place, and time.  Skin: Skin is warm and dry.  Psychiatric: She has a normal mood and affect.    MAU Course  Procedures  Results for orders placed during the hospital encounter of 12/05/13 (from the past 24 hour(s))  URINALYSIS, ROUTINE W REFLEX MICROSCOPIC     Status: Abnormal   Collection Time    12/05/13  8:30 PM      Result Value Ref Range   Color, Urine YELLOW  YELLOW   APPearance CLEAR  CLEAR   Specific Gravity, Urine >1.030 (*) 1.005 - 1.030   pH 6.0  5.0 - 8.0   Glucose, UA NEGATIVE  NEGATIVE mg/dL   Hgb urine dipstick NEGATIVE  NEGATIVE  Bilirubin Urine NEGATIVE  NEGATIVE   Ketones, ur NEGATIVE  NEGATIVE mg/dL   Protein, ur NEGATIVE  NEGATIVE mg/dL   Urobilinogen, UA 0.2  0.0 - 1.0 mg/dL   Nitrite NEGATIVE  NEGATIVE   Leukocytes, UA TRACE (*) NEGATIVE  URINE MICROSCOPIC-ADD ON     Status: Abnormal   Collection Time    12/05/13  8:30 PM      Result Value Ref Range   Squamous Epithelial / LPF FEW (*) RARE   WBC, UA 3-6  <3 WBC/hpf   RBC / HPF 3-6  <3 RBC/hpf   Bacteria, UA FEW (*) RARE    Assessment and Plan   1. Round ligament pain    Comfort measures reviewed 2nd trimester danger signs reviewed  Urine culture pending Return to MAU as needed FU with Dr. Gaynell David as planned for next week   Tawnya CrookHeather Donovan Hogan 12/05/2013, 9:05 PM

## 2013-12-07 LAB — URINE CULTURE

## 2013-12-08 ENCOUNTER — Telehealth: Payer: Self-pay | Admitting: Nurse Practitioner

## 2013-12-08 DIAGNOSIS — N390 Urinary tract infection, site not specified: Secondary | ICD-10-CM

## 2013-12-08 DIAGNOSIS — O2341 Unspecified infection of urinary tract in pregnancy, first trimester: Secondary | ICD-10-CM | POA: Insufficient documentation

## 2013-12-08 MED ORDER — NITROFURANTOIN MONOHYD MACRO 100 MG PO CAPS: 100.0000 mg | ORAL_CAPSULE | Freq: Two times a day (BID) | ORAL | Status: DC

## 2013-12-08 NOTE — Telephone Encounter (Signed)
Pt informed of UTI. Called in Macrobid 100 mg BID x 7 days to [facility] on [address]. Advised to force fluids

## 2013-12-11 ENCOUNTER — Other Ambulatory Visit (HOSPITAL_COMMUNITY): Payer: Self-pay | Admitting: Obstetrics

## 2013-12-11 DIAGNOSIS — Z3689 Encounter for other specified antenatal screening: Secondary | ICD-10-CM

## 2013-12-17 ENCOUNTER — Encounter (HOSPITAL_COMMUNITY): Payer: Self-pay

## 2013-12-17 ENCOUNTER — Ambulatory Visit (HOSPITAL_COMMUNITY)
Admission: RE | Admit: 2013-12-17 | Discharge: 2013-12-17 | Disposition: A | Discharge status: Home / Self Care | Payer: 59 | Source: Ambulatory Visit | Attending: Obstetrics | Admitting: Obstetrics

## 2013-12-17 DIAGNOSIS — Z3689 Encounter for other specified antenatal screening: Secondary | ICD-10-CM | POA: Insufficient documentation

## 2014-01-21 ENCOUNTER — Encounter (HOSPITAL_COMMUNITY): Payer: Self-pay | Admitting: *Deleted

## 2014-01-21 ENCOUNTER — Inpatient Hospital Stay (HOSPITAL_COMMUNITY)
Admission: AD | Admit: 2014-01-21 | Discharge: 2014-01-21 | Disposition: A | Discharge status: Home / Self Care | Payer: 59 | Source: Ambulatory Visit | Attending: Obstetrics | Admitting: Obstetrics

## 2014-01-21 DIAGNOSIS — L509 Urticaria, unspecified: Secondary | ICD-10-CM

## 2014-01-21 DIAGNOSIS — Z87891 Personal history of nicotine dependence: Secondary | ICD-10-CM | POA: Insufficient documentation

## 2014-01-21 DIAGNOSIS — O9989 Other specified diseases and conditions complicating pregnancy, childbirth and the puerperium: Principal | ICD-10-CM

## 2014-01-21 DIAGNOSIS — O99891 Other specified diseases and conditions complicating pregnancy: Secondary | ICD-10-CM | POA: Insufficient documentation

## 2014-01-21 LAB — OB RESULTS CONSOLE RPR: RPR: NONREACTIVE

## 2014-01-21 LAB — OB RESULTS CONSOLE HIV ANTIBODY (ROUTINE TESTING): HIV: NONREACTIVE

## 2014-01-21 MED ORDER — HYDROXYZINE HCL 25 MG PO TABS: 25.0000 mg | ORAL_TABLET | Freq: Four times a day (QID) | ORAL | Status: DC

## 2014-01-21 MED ORDER — TRIAMCINOLONE ACETONIDE 0.1 % EX OINT: 1.0000 "application " | TOPICAL_OINTMENT | Freq: Every day | CUTANEOUS | Status: DC

## 2014-01-21 NOTE — MAU Note (Signed)
Pt denies any abd pain, bleeding, or LOF

## 2014-01-21 NOTE — MAU Provider Note (Signed)
History     CSN: [id number]  Arrival date and time: 01/21/14 4098   First Provider Initiated Contact with Patient 01/21/14 1018      Chief Complaint  Patient presents with  . Rash   HPI Comments: Tonya David 28 y.o [id number] [redacted]w[redacted]d presents to MAU for itchy rash that has been ongoing since Sunday. She has not had any new medications, soaps, detergents, foods. No others have same rash in family. She has used benadryl cream and cortisone cream. The rash in on her left abdomen arm and under right arm.  Rash      Past Medical History  Diagnosis Date  . Preterm labor   . Anemia   . Urinary tract infection   . GNFAOZHY(865.7)     Past Surgical History  Procedure Laterality Date  . Therapeutic abortion      Family History  Problem Relation Age of Onset  . Alcohol abuse Neg Hx   . Arthritis Neg Hx   . Asthma Neg Hx   . Birth defects Neg Hx   . COPD Neg Hx   . Depression Neg Hx   . Diabetes Neg Hx   . Drug abuse Neg Hx   . Early death Neg Hx   . Hearing loss Neg Hx   . Heart disease Neg Hx   . Hypertension Neg Hx   . Hyperlipidemia Neg Hx   . Kidney disease Neg Hx   . Learning disabilities Neg Hx   . Mental illness Neg Hx   . Mental retardation Neg Hx   . Miscarriages / Stillbirths Neg Hx   . Stroke Neg Hx   . Vision loss Neg Hx   . Other Neg Hx   . Cancer Maternal Grandmother   . Cancer Maternal Grandfather     History  Substance Use Topics  . Smoking status: Former Smoker -- 0.25 packs/day for 4 years    Types: Cigarettes    Quit date: 11/21/2013  . Smokeless tobacco: Never Used  . Alcohol Use: Yes     Comment: on occasionsNONE SINCE PREG    Allergies: No Known Allergies  Prescriptions prior to admission  Medication Sig Dispense Refill  . Prenatal Vit-Fe Fumarate-FA (PRENATAL MULTIVITAMIN) TABS tablet Take 1 tablet by mouth daily at 12 noon.        Review of Systems  Constitutional: Negative.   HENT: Negative.   Eyes: Negative.    Respiratory: Negative.   Cardiovascular: Negative.   Gastrointestinal: Negative.   Genitourinary: Negative.   Musculoskeletal: Negative.   Skin: Positive for rash.  Neurological: Negative.   Endo/Heme/Allergies: Negative.   Psychiatric/Behavioral: Negative.    Physical Exam   Blood pressure 127/68, pulse 98, temperature 98.7 F (37.1 C), temperature source Oral, resp. rate 18, last menstrual period 07/29/2013.  Physical Exam  Constitutional: She is oriented to person, place, and time. She appears well-developed and well-nourished. No distress.  HENT:  Head: Normocephalic and atraumatic.  Cardiovascular: Normal rate, regular rhythm and normal heart sounds.   Respiratory: Effort normal and breath sounds normal.  Neurological: She is alert and oriented to person, place, and time.  Skin: Skin is warm and dry.  Raised, red, itchy rash over left abdomen thigh, forearm and under left arm pit  Psychiatric: She has a normal mood and affect. Her behavior is normal. Judgment and thought content normal.    MAU Course  Procedures  MDM   Assessment and Plan  A: Urticaria ? PUPPS  P: Triamcinolone  0.1% to affected area daily Vistaril 25 mg po q8 hours prn itching Follow up with Dr Gaynell FaceMarshall if unresolved   Carolynn ServeBarefoot, Aryella Besecker Miller 01/21/2014, 10:49 AM

## 2014-01-21 NOTE — Discharge Instructions (Signed)
Hives Hives are itchy, red, swollen areas of the skin. They can vary in size and location on your body. Hives can come and go for hours or several days (acute hives) or for several weeks (chronic hives). Hives do not spread from person to person (noncontagious). They may get worse with scratching, exercise, and emotional stress. CAUSES   Allergic reaction to food, additives, or drugs.  Infections, including the common cold.  Illness, such as vasculitis, lupus, or thyroid disease.  Exposure to sunlight, heat, or cold.  Exercise.  Stress.  Contact with chemicals. SYMPTOMS   Red or white swollen patches on the skin. The patches may change size, shape, and location quickly and repeatedly.  Itching.  Swelling of the hands, feet, and face. This may occur if hives develop deeper in the skin. DIAGNOSIS  Your caregiver can usually tell what is wrong by performing a physical exam. Skin or blood tests may also be done to determine the cause of your hives. In some cases, the cause cannot be determined. TREATMENT  Mild cases usually get better with medicines such as antihistamines. Severe cases may require an emergency epinephrine injection. If the cause of your hives is known, treatment includes avoiding that trigger.  HOME CARE INSTRUCTIONS   Avoid causes that trigger your hives.  Take antihistamines as directed by your caregiver to reduce the severity of your hives. Non-sedating or low-sedating antihistamines are usually recommended. Do not drive while taking an antihistamine.  Take any other medicines prescribed for itching as directed by your caregiver.  Wear loose-fitting clothing.  Keep all follow-up appointments as directed by your caregiver. SEEK MEDICAL CARE IF:   You have persistent or severe itching that is not relieved with medicine.  You have painful or swollen joints. SEEK IMMEDIATE MEDICAL CARE IF:   You have a fever.  Your tongue or lips are swollen.  You have  trouble breathing or swallowing.  You feel tightness in the throat or chest.  You have abdominal pain. These problems may be the first sign of a life-threatening allergic reaction. Call your local emergency services (911 in [place] MAKE SURE YOU:   Understand these instructions.  Will watch your condition.  Will get help right away if you are not doing well or get worse. Document Released: 06/26/2005 Document Revised: 07/01/2013 Document Reviewed: 09/19/2011 ExitCare Patient Information 2015 [employer]. This information is not intended to replace advice given to you by your health care provider. Make sure you discuss any questions you have with your health care provider.  

## 2014-01-21 NOTE — MAU Note (Signed)
Pt has raised red rash on L arm, L groin & R armpit, started on Sunday.  Pt states she has used several creams that have not worked.  Pt unaware of any possible triggers.

## 2014-03-10 ENCOUNTER — Encounter (HOSPITAL_COMMUNITY): Payer: Self-pay | Admitting: *Deleted

## 2014-03-10 ENCOUNTER — Inpatient Hospital Stay (HOSPITAL_COMMUNITY)
Admission: AD | Admit: 2014-03-10 | Discharge: 2014-03-10 | Disposition: A | Discharge status: Home / Self Care | Payer: 59 | Source: Ambulatory Visit | Attending: Obstetrics | Admitting: Obstetrics

## 2014-03-10 DIAGNOSIS — A499 Bacterial infection, unspecified: Secondary | ICD-10-CM | POA: Insufficient documentation

## 2014-03-10 DIAGNOSIS — R109 Unspecified abdominal pain: Secondary | ICD-10-CM | POA: Insufficient documentation

## 2014-03-10 DIAGNOSIS — M545 Low back pain, unspecified: Secondary | ICD-10-CM | POA: Insufficient documentation

## 2014-03-10 DIAGNOSIS — B9689 Other specified bacterial agents as the cause of diseases classified elsewhere: Secondary | ICD-10-CM | POA: Diagnosis not present

## 2014-03-10 DIAGNOSIS — O2390 Unspecified genitourinary tract infection in pregnancy, unspecified trimester: Secondary | ICD-10-CM | POA: Insufficient documentation

## 2014-03-10 DIAGNOSIS — O99891 Other specified diseases and conditions complicating pregnancy: Secondary | ICD-10-CM | POA: Diagnosis present

## 2014-03-10 DIAGNOSIS — R8789 Other abnormal findings in specimens from female genital organs: Secondary | ICD-10-CM

## 2014-03-10 DIAGNOSIS — O09219 Supervision of pregnancy with history of pre-term labor, unspecified trimester: Secondary | ICD-10-CM

## 2014-03-10 DIAGNOSIS — O09893 Supervision of other high risk pregnancies, third trimester: Secondary | ICD-10-CM

## 2014-03-10 DIAGNOSIS — O4700 False labor before 37 completed weeks of gestation, unspecified trimester: Secondary | ICD-10-CM | POA: Insufficient documentation

## 2014-03-10 DIAGNOSIS — O09899 Supervision of other high risk pregnancies, unspecified trimester: Secondary | ICD-10-CM

## 2014-03-10 DIAGNOSIS — N760 Acute vaginitis: Secondary | ICD-10-CM | POA: Insufficient documentation

## 2014-03-10 DIAGNOSIS — O9989 Other specified diseases and conditions complicating pregnancy, childbirth and the puerperium: Principal | ICD-10-CM

## 2014-03-10 DIAGNOSIS — Z87891 Personal history of nicotine dependence: Secondary | ICD-10-CM | POA: Insufficient documentation

## 2014-03-10 DIAGNOSIS — O09213 Supervision of pregnancy with history of pre-term labor, third trimester: Secondary | ICD-10-CM

## 2014-03-10 LAB — FETAL FIBRONECTIN: Fetal Fibronectin: POSITIVE — AB

## 2014-03-10 LAB — WET PREP, GENITAL
TRICH WET PREP: NONE SEEN
Yeast Wet Prep HPF POC: NONE SEEN

## 2014-03-10 LAB — POCT FERN TEST: POCT Fern Test: NEGATIVE

## 2014-03-10 MED ORDER — TERBUTALINE SULFATE 1 MG/ML IJ SOLN
0.2500 mg | Freq: Once | INTRAMUSCULAR | Status: AC
  Administered 2014-03-10: 0.25 mg via SUBCUTANEOUS
  Filled 2014-03-10: qty 1

## 2014-03-10 MED ORDER — BETAMETHASONE SOD PHOS & ACET 6 (3-3) MG/ML IJ SUSP
12.0000 mg | Freq: Once | INTRAMUSCULAR | Status: AC
  Administered 2014-03-10: 12 mg via INTRAMUSCULAR
  Filled 2014-03-10: qty 2

## 2014-03-10 NOTE — MAU Note (Signed)
Was at work yesterday, thought she had peed on herself, was d/c- "just gushing out".  Put on a pad, has continued to have white/watery and mucus d/c.  Back started hurting and is cramping.

## 2014-03-10 NOTE — Discharge Instructions (Signed)
Preterm Labor Information °Preterm labor is when labor starts at less than 37 weeks of pregnancy. The normal length of a pregnancy is 39 to 41 weeks. °CAUSES °Often, there is no identifiable underlying cause as to why a woman goes into preterm labor. One of the most common known causes of preterm labor is infection. Infections of the uterus, cervix, vagina, amniotic sac, bladder, kidney, or even the lungs (pneumonia) can cause labor to start. Other suspected causes of preterm labor include:  °· Urogenital infections, such as yeast infections and bacterial vaginosis.   °· Uterine abnormalities (uterine shape, uterine septum, fibroids, or bleeding from the placenta).   °· A cervix that has been operated on (it may fail to stay closed).   °· Malformations in the fetus.   °· Multiple gestations (twins, triplets, and so on).   °· Breakage of the amniotic sac.   °RISK FACTORS °· Having a previous history of preterm labor.   °· Having premature rupture of membranes (PROM).   °· Having a placenta that covers the opening of the cervix (placenta previa).   °· Having a placenta that separates from the uterus (placental abruption).   °· Having a cervix that is too weak to hold the fetus in the uterus (incompetent cervix).   °· Having too much fluid in the amniotic sac (polyhydramnios).   °· Taking illegal drugs or smoking while pregnant.   °· Not gaining enough weight while pregnant.   °· Being younger than 18 and older than 26 years old.   °· Having a low socioeconomic status.   °· Being African American. °SYMPTOMS °Signs and symptoms of preterm labor include:  °· Menstrual-like cramps, abdominal pain, or back pain. °· Uterine contractions that are regular, as frequent as six in an hour, regardless of their intensity (may be mild or painful). °· Contractions that start on the top of the uterus and spread down to the lower abdomen and back.   °· A sense of increased pelvic pressure.   °· A watery or bloody mucus discharge that  comes from the vagina.   °TREATMENT °Depending on the length of the pregnancy and other circumstances, your health care provider may suggest bed rest. If necessary, there are medicines that can be given to stop contractions and to mature the fetal lungs. If labor happens before 34 weeks of pregnancy, a prolonged hospital stay may be recommended. Treatment depends on the condition of both you and the fetus.  °WHAT SHOULD YOU DO IF YOU THINK YOU ARE IN PRETERM LABOR? °Call your health care provider right away. You will need to go to the hospital to get checked immediately. °HOW CAN YOU PREVENT PRETERM LABOR IN FUTURE PREGNANCIES? °You should:  °· Stop smoking if you smoke.  °· Maintain healthy weight gain and avoid chemicals and drugs that are not necessary. °· Be watchful for any type of infection. °· Inform your health care provider if you have a known history of preterm labor. °Document Released: 09/16/2003 Document Revised: 02/26/2013 Document Reviewed: 07/29/2012 °ExitCare® Patient Information ©2015 [employer]. This information is not intended to replace advice given to you by your health care provider. Make sure you discuss any questions you have with your health care provider. ° °Bacterial Vaginosis °Bacterial vaginosis is a vaginal infection that occurs when the normal balance of bacteria in the vagina is disrupted. It results from an overgrowth of certain bacteria. This is the most common vaginal infection in women of childbearing age. Treatment is important to prevent complications, especially in pregnant women, as it can cause a premature delivery. °CAUSES  °Bacterial vaginosis is caused   by an increase in harmful bacteria that are normally present in smaller amounts in the vagina. Several different kinds of bacteria can cause bacterial vaginosis. However, the reason that the condition develops is not fully understood. °RISK FACTORS °Certain activities or behaviors can put you at an increased risk of  developing bacterial vaginosis, including: °· Having a new sex partner or multiple sex partners. °· Douching. °· Using an intrauterine device (IUD) for contraception. °Women do not get bacterial vaginosis from toilet seats, bedding, swimming pools, or contact with objects around them. °SIGNS AND SYMPTOMS  °Some women with bacterial vaginosis have no signs or symptoms. Common symptoms include: °· Grey vaginal discharge. °· A fishlike odor with discharge, especially after sexual intercourse. °· Itching or burning of the vagina and vulva. °· Burning or pain with urination. °DIAGNOSIS  °Your health care provider will take a medical history and examine the vagina for signs of bacterial vaginosis. A sample of vaginal fluid may be taken. Your health care provider will look at this sample under a microscope to check for bacteria and abnormal cells. A vaginal pH test may also be done.  °TREATMENT  °Bacterial vaginosis may be treated with antibiotic medicines. These may be given in the form of a pill or a vaginal cream. A second round of antibiotics may be prescribed if the condition comes back after treatment.  °HOME CARE INSTRUCTIONS  °· Only take over-the-counter or prescription medicines as directed by your health care provider. °· If antibiotic medicine was prescribed, take it as directed. Make sure you finish it even if you start to feel better. °· Do not have sex until treatment is completed. °· Tell all sexual partners that you have a vaginal infection. They should see their health care provider and be treated if they have problems, such as a mild rash or itching. °· Practice safe sex by using condoms and only having one sex partner. °SEEK MEDICAL CARE IF:  °· Your symptoms are not improving after 3 days of treatment. °· You have increased discharge or pain. °· You have a fever. °MAKE SURE YOU:  °· Understand these instructions. °· Will watch your condition. °· Will get help right away if you are not doing well or get  worse. °FOR MORE INFORMATION  °Centers for Disease Control and Prevention, Division of STD Prevention: www.cdc.gov/std °American Sexual Health Association (ASHA): www.ashastd.org  °Document Released: 06/26/2005 Document Revised: 04/16/2013 Document Reviewed: 02/05/2013 °ExitCare® Patient Information ©2015 [employer]. This information is not intended to replace advice given to you by your health care provider. Make sure you discuss any questions you have with your health care provider. ° °

## 2014-03-10 NOTE — MAU Provider Note (Signed)
Chief Complaint:  Rupture of Membranes   First Provider Initiated Contact with Patient 03/10/14 1503     HPI: Tonya David is a 28 y.o. [id number] at [redacted]w[redacted]d who presents to maternity admissions reporting leaking a white, frothy discharge since yesterday. Mild low abdominal and low back cramping, but not like when she went into preterm labor with her previous children. No recent intercourse.  Denies vaginal itching, odor or vaginal bleeding. Good fetal movement.   Pregnancy Course: History of preterm birth at 65 weeks with 2 previous children.  Past Medical History: Past Medical History  Diagnosis Date  . Preterm labor   . Anemia   . Urinary tract infection   . AVWUJWJX(914.7)     Past obstetric history: OB History  Gravida Para Term Preterm AB SAB TAB Ectopic Multiple Living  0 0 5    # Outcome Date GA Lbr Len/2nd Weight Sex Delivery Anes PTL Lv  9 CUR           8 TRM 06/14/12 [redacted]w[redacted]d 04:10 / 00:12 2.914 kg (6 lb 6.8 oz) F SVD EPI  Y  7 PRE 2011 [redacted]w[redacted]d   F SVD  Y Y  6 PRE 2009 [redacted]w[redacted]d   F SVD  Y Y  5 TRM 2007 [redacted]w[redacted]d   M SVD  N Y  4 PRE     F SVD   Y  3 SAB              Comments: System Generated. Please review and update pregnancy details.  2 TAB           1 TAB               Past Surgical History: Past Surgical History  Procedure Laterality Date  . Therapeutic abortion       Family History: Family History  Problem Relation Age of Onset  . Cancer Maternal Grandmother   . Cancer Maternal Grandfather     Social History: History  Substance Use Topics  . Smoking status: Former Smoker -- 0.25 packs/day for 4 years    Types: Cigarettes    Quit date: 11/21/2013  . Smokeless tobacco: Never Used  . Alcohol Use: Yes     Comment: on occasionsNONE SINCE PREG    Allergies: No Known Allergies  Meds:  No prescriptions prior to admission    ROS: Pertinent findings in history of present illness. Negative for fever, chills, abdominal pain, urinary  complaints, GI complaints.  Physical Exam  Blood pressure 140/78, pulse 94, temperature 99.1 F (37.3 C), temperature source Oral, resp. rate 18, height 5' 5.5" (1.664 m), weight 72.122 kg (159 lb), last menstrual period 07/29/2013. GENERAL: Well-developed, well-nourished female in no acute distress.  HEENT: normocephalic HEART: normal rate RESP: normal effort ABDOMEN: Soft, non-tender, gravid appropriate for gestational age EXTREMITIES: Nontender, no edema NEURO: alert and oriented SPECULUM EXAM: NEFG, moderate amount of frothy, white, mildly malodorous discharge, no blood, cervix clean Dilation: Fingertip Effacement (%): Thick Cervical Position: Posterior Presentation: Undeterminable Exam by:: Dorathy Kinsman, CNM  FHT:  Baseline 130 , moderate variability, accelerations present, no decelerations Contractions: Irregular, mild   Labs: Results for orders placed during the hospital encounter of 03/10/14 (from the past 24 hour(s))  WET PREP, GENITAL     Status: Abnormal   Collection Time    03/10/14  3:10 PM      Result Value Ref Range   Yeast Wet Prep HPF POC NONE  SEEN  NONE SEEN   Trich, Wet Prep NONE SEEN  NONE SEEN   Clue Cells Wet Prep HPF POC FEW (*) NONE SEEN   WBC, Wet Prep HPF POC MODERATE (*) NONE SEEN  FETAL FIBRONECTIN     Status: Abnormal   Collection Time    03/10/14  3:10 PM      Result Value Ref Range   Fetal Fibronectin POSITIVE (*) NEGATIVE  POCT FERN TEST     Status: None   Collection Time    03/10/14  5:27 PM      Result Value Ref Range   POCT Fern Test Negative = intact amniotic membranes      Imaging:  No results found. MAU Course: Betamethasone and terbutaline per consult with Dr. Gaynell Face.  Assessment: 1. Positive fetal fibronectin at 22 weeks to [redacted] weeks gestation   2. BV (bacterial vaginosis)   3. History of preterm delivery, currently pregnant in third trimester     Plan: Discharge home in stable condition per consult with Dr.  Gaynell Face. Preterm labor precautions and fetal kick counts Return to maternity admissions for second betamethasone shots of arm. Increase fluids and rest.     Follow-up Information   Follow up with MARSHALL,BERNARD A, MD. (As scheduled or as needed if symptoms worsen)    Specialty:  Obstetrics and Gynecology   Contact information:   [address] [place] [address] [phone]       Follow up with [facility] In 1 day. (for second betamethasone shot)    Contact information:   [address] [id number] [place] [address] [phone]       Medication List    Notice   You have not been prescribed any medications.      [place], [place] 03/10/2014 6:13 PM

## 2014-03-10 NOTE — MAU Note (Signed)
Urine in lab 

## 2014-03-11 ENCOUNTER — Inpatient Hospital Stay (HOSPITAL_COMMUNITY)
Admission: AD | Admit: 2014-03-11 | Discharge: 2014-03-11 | Disposition: A | Discharge status: Home / Self Care | Payer: 59 | Source: Ambulatory Visit | Attending: Obstetrics | Admitting: Obstetrics

## 2014-03-11 DIAGNOSIS — O4700 False labor before 37 completed weeks of gestation, unspecified trimester: Secondary | ICD-10-CM | POA: Insufficient documentation

## 2014-03-11 LAB — GC/CHLAMYDIA PROBE AMP
CT Probe RNA: NEGATIVE
GC Probe RNA: NEGATIVE

## 2014-03-11 MED ORDER — BETAMETHASONE SOD PHOS & ACET 6 (3-3) MG/ML IJ SUSP
12.0000 mg | Freq: Once | INTRAMUSCULAR | Status: AC
  Administered 2014-03-11: 12 mg via INTRAMUSCULAR
  Filled 2014-03-11: qty 2

## 2014-03-11 NOTE — MAU Note (Signed)
Here for 2nd steriod injection

## 2014-03-31 ENCOUNTER — Inpatient Hospital Stay (HOSPITAL_COMMUNITY)
Admission: AD | Admit: 2014-03-31 | Discharge: 2014-03-31 | Disposition: A | Discharge status: Home / Self Care | Payer: 59 | Source: Ambulatory Visit | Attending: Obstetrics | Admitting: Obstetrics

## 2014-03-31 ENCOUNTER — Encounter (HOSPITAL_COMMUNITY): Payer: Self-pay | Admitting: *Deleted

## 2014-03-31 DIAGNOSIS — O4700 False labor before 37 completed weeks of gestation, unspecified trimester: Secondary | ICD-10-CM

## 2014-03-31 DIAGNOSIS — Z87891 Personal history of nicotine dependence: Secondary | ICD-10-CM | POA: Diagnosis not present

## 2014-03-31 DIAGNOSIS — O4703 False labor before 37 completed weeks of gestation, third trimester: Secondary | ICD-10-CM

## 2014-03-31 LAB — URINALYSIS, ROUTINE W REFLEX MICROSCOPIC
BILIRUBIN URINE: NEGATIVE
Glucose, UA: NEGATIVE mg/dL
Hgb urine dipstick: NEGATIVE
KETONES UR: NEGATIVE mg/dL
NITRITE: NEGATIVE
PROTEIN: NEGATIVE mg/dL
Specific Gravity, Urine: <1.005 — ABNORMAL LOW (ref 1.005–1.030)
Urobilinogen, UA: 0.2 mg/dL (ref 0.0–1.0)
pH: 6 (ref 5.0–8.0)

## 2014-03-31 LAB — URINE MICROSCOPIC-ADD ON

## 2014-03-31 NOTE — MAU Note (Signed)
Patient states she has been having painful irregular contractions, every 10 minutes. Denies bleeding or leaking and reports good fetal movement.

## 2014-03-31 NOTE — Discharge Instructions (Signed)
Fetal Movement Counts °Patient Name: __________________________________________________ Patient Due Date: ____________________ °Performing a fetal movement count is highly recommended in high-risk pregnancies, but it is good for every pregnant woman to do. Your health care provider may ask you to start counting fetal movements at 28 weeks of the pregnancy. Fetal movements often increase: °· After eating a full meal. °· After physical activity. °· After eating or drinking something sweet or cold. °· At rest. °Pay attention to when you feel the baby is most active. This will help you notice a pattern of your baby's sleep and wake cycles and what factors contribute to an increase in fetal movement. It is important to perform a fetal movement count at the same time each day when your baby is normally most active.  °HOW TO COUNT FETAL MOVEMENTS °1. Find a quiet and comfortable area to sit or lie down on your left side. Lying on your left side provides the best blood and oxygen circulation to your baby. °2. Write down the day and time on a sheet of paper or in a journal. °3. Start counting kicks, flutters, swishes, rolls, or jabs in a 2-hour period. You should feel at least 10 movements within 2 hours. °4. If you do not feel 10 movements in 2 hours, wait 2-3 hours and count again. Look for a change in the pattern or not enough counts in 2 hours. °SEEK MEDICAL CARE IF: °· You feel less than 10 counts in 2 hours, tried twice. °· There is no movement in over an hour. °· The pattern is changing or taking longer each day to reach 10 counts in 2 hours. °· You feel the baby is not moving as he or she usually does. °Date: ____________ Movements: ____________ Start time: ____________ Finish time: ____________  °Date: ____________ Movements: ____________ Start time: ____________ Finish time: ____________ °Date: ____________ Movements: ____________ Start time: ____________ Finish time: ____________ °Date: ____________ Movements:  ____________ Start time: ____________ Finish time: ____________ °Date: ____________ Movements: ____________ Start time: ____________ Finish time: ____________ °Date: ____________ Movements: ____________ Start time: ____________ Finish time: ____________ °Date: ____________ Movements: ____________ Start time: ____________ Finish time: ____________ °Date: ____________ Movements: ____________ Start time: ____________ Finish time: ____________  °Date: ____________ Movements: ____________ Start time: ____________ Finish time: ____________ °Date: ____________ Movements: ____________ Start time: ____________ Finish time: ____________ °Date: ____________ Movements: ____________ Start time: ____________ Finish time: ____________ °Date: ____________ Movements: ____________ Start time: ____________ Finish time: ____________ °Date: ____________ Movements: ____________ Start time: ____________ Finish time: ____________ °Date: ____________ Movements: ____________ Start time: ____________ Finish time: ____________ °Date: ____________ Movements: ____________ Start time: ____________ Finish time: ____________  °Date: ____________ Movements: ____________ Start time: ____________ Finish time: ____________ °Date: ____________ Movements: ____________ Start time: ____________ Finish time: ____________ °Date: ____________ Movements: ____________ Start time: ____________ Finish time: ____________ °Date: ____________ Movements: ____________ Start time: ____________ Finish time: ____________ °Date: ____________ Movements: ____________ Start time: ____________ Finish time: ____________ °Date: ____________ Movements: ____________ Start time: ____________ Finish time: ____________ °Date: ____________ Movements: ____________ Start time: ____________ Finish time: ____________  °Date: ____________ Movements: ____________ Start time: ____________ Finish time: ____________ °Date: ____________ Movements: ____________ Start time: ____________ Finish  time: ____________ °Date: ____________ Movements: ____________ Start time: ____________ Finish time: ____________ °Date: ____________ Movements: ____________ Start time: ____________ Finish time: ____________ °Date: ____________ Movements: ____________ Start time: ____________ Finish time: ____________ °Date: ____________ Movements: ____________ Start time: ____________ Finish time: ____________ °Date: ____________ Movements: ____________ Start time: ____________ Finish time: ____________  °Date: ____________ Movements: ____________ Start time: ____________ Finish   time: ____________ °Date: ____________ Movements: ____________ Start time: ____________ Finish time: ____________ °Date: ____________ Movements: ____________ Start time: ____________ Finish time: ____________ °Date: ____________ Movements: ____________ Start time: ____________ Finish time: ____________ °Date: ____________ Movements: ____________ Start time: ____________ Finish time: ____________ °Date: ____________ Movements: ____________ Start time: ____________ Finish time: ____________ °Date: ____________ Movements: ____________ Start time: ____________ Finish time: ____________  °Date: ____________ Movements: ____________ Start time: ____________ Finish time: ____________ °Date: ____________ Movements: ____________ Start time: ____________ Finish time: ____________ °Date: ____________ Movements: ____________ Start time: ____________ Finish time: ____________ °Date: ____________ Movements: ____________ Start time: ____________ Finish time: ____________ °Date: ____________ Movements: ____________ Start time: ____________ Finish time: ____________ °Date: ____________ Movements: ____________ Start time: ____________ Finish time: ____________ °Date: ____________ Movements: ____________ Start time: ____________ Finish time: ____________  °Date: ____________ Movements: ____________ Start time: ____________ Finish time: ____________ °Date: ____________  Movements: ____________ Start time: ____________ Finish time: ____________ °Date: ____________ Movements: ____________ Start time: ____________ Finish time: ____________ °Date: ____________ Movements: ____________ Start time: ____________ Finish time: ____________ °Date: ____________ Movements: ____________ Start time: ____________ Finish time: ____________ °Date: ____________ Movements: ____________ Start time: ____________ Finish time: ____________ °Date: ____________ Movements: ____________ Start time: ____________ Finish time: ____________  °Date: ____________ Movements: ____________ Start time: ____________ Finish time: ____________ °Date: ____________ Movements: ____________ Start time: ____________ Finish time: ____________ °Date: ____________ Movements: ____________ Start time: ____________ Finish time: ____________ °Date: ____________ Movements: ____________ Start time: ____________ Finish time: ____________ °Date: ____________ Movements: ____________ Start time: ____________ Finish time: ____________ °Date: ____________ Movements: ____________ Start time: ____________ Finish time: ____________ °Document Released: 07/26/2006 Document Revised: 11/10/2013 Document Reviewed: 04/22/2012 °[employer]® Patient Information ©2015 [employer]. This information is not intended to replace advice given to you by your health care provider. Make sure you discuss any questions you have with your health care provider. °Braxton Hicks Contractions °Contractions of the uterus can occur throughout pregnancy. Contractions are not always a sign that you are in labor.  °WHAT ARE BRAXTON HICKS CONTRACTIONS?  °Contractions that occur before labor are called Braxton Hicks contractions, or false labor. Toward the end of pregnancy (32-34 weeks), these contractions can develop more often and may become more forceful. This is not true labor because these contractions do not result in opening (dilatation) and thinning of the cervix. They  are sometimes difficult to tell apart from true labor because these contractions can be forceful and people have different pain tolerances. You should not feel embarrassed if you go to the hospital with false labor. Sometimes, the only way to tell if you are in true labor is for your health care provider to look for changes in the cervix. °If there are no prenatal problems or other health problems associated with the pregnancy, it is completely safe to be sent home with false labor and await the onset of true labor. °HOW CAN YOU TELL THE DIFFERENCE BETWEEN TRUE AND FALSE LABOR? °False Labor °· The contractions of false labor are usually shorter and not as hard as those of true labor.   °· The contractions are usually irregular.   °· The contractions are often felt in the front of the lower abdomen and in the groin.   °· The contractions may go away when you walk around or change positions while lying down.   °· The contractions get weaker and are shorter lasting as time goes on.   °· The contractions do not usually become progressively stronger, regular, and closer together as with true labor.   °True Labor °· Contractions in true labor last 30-70 seconds, become   very regular, usually become more intense, and increase in frequency.   The contractions do not go away with walking.   The discomfort is usually felt in the top of the uterus and spreads to the lower abdomen and low back.   True labor can be determined by your health care provider with an exam. This will show that the cervix is dilating and getting thinner.  WHAT TO REMEMBER  Keep up with your usual exercises and follow other instructions given by your health care provider.   Take medicines as directed by your health care provider.   Keep your regular prenatal appointments.   Eat and drink lightly if you think you are going into labor.   If Braxton Hicks contractions are making you uncomfortable:   Change your position from  lying down or resting to walking, or from walking to resting.   Sit and rest in a tub of warm water.   Drink 2-3 glasses of water. Dehydration may cause these contractions.   Do slow and deep breathing several times an hour.  WHEN SHOULD I SEEK IMMEDIATE MEDICAL CARE? Seek immediate medical care if:  Your contractions become stronger, more regular, and closer together.   You have fluid leaking or gushing from your vagina.   You have a fever.    You have vaginal bleeding.   You have continuous abdominal pain.   You have low back pain that you never had before.   You feel your baby's head pushing down and causing pelvic pressure.   Your baby is not moving as much as it used to.  Document Released: 06/26/2005 Document Revised: 07/01/2013 Document Reviewed: 04/07/2013 [facility] Patient Information 2015 [place], [place]. This information is not intended to replace advice given to you by your health care provider. Make sure you discuss any questions you have with your health care provider.

## 2014-03-31 NOTE — MAU Note (Addendum)
Pt c/o feeling contractions that started at about 0500 this morning. Also reports left hip pain that shoots down her leg.

## 2014-03-31 NOTE — MAU Provider Note (Signed)
Chief Complaint:  Labor Eval  First Provider Initiated Contact with Patient 03/31/14 1250     HPI: Tonya David is a 28 y.o. [id number] at [redacted]w[redacted]d who presents to maternity admissions reporting preterm contractions and hip pain. Hx 36 week delivery x 2. BMZ 9/1 and 9/2. Denies leakageof fluid , vaginal discharge, urinary complatins or vaginal bleeding. Good fetal movement.   Past Medical History: Past Medical History  Diagnosis Date  . Preterm labor   . Anemia   . Urinary tract infection   . [id number](914.7)     Past obstetric history: OB History  Gravida Para Term Preterm AB SAB TAB Ectopic Multiple Living  0 0 5    # Outcome Date GA Lbr Len/2nd Weight Sex Delivery Anes PTL Lv  9 CUR           8 TRM 06/14/12 [redacted]w[redacted]d 04:10 / 00:12 2.914 kg (6 lb 6.8 oz) F SVD EPI  Y  7 PRE 2011 [redacted]w[redacted]d   F SVD  Y Y  6 PRE 2009 [redacted]w[redacted]d   F SVD  Y Y  5 TRM 2007 [redacted]w[redacted]d   M SVD  N Y  4 PRE     F SVD   Y  3 SAB              Comments: System Generated. Please review and update pregnancy details.  2 TAB           1 TAB               Past Surgical History: Past Surgical History  Procedure Laterality Date  . Therapeutic abortion       Family History: Family History  Problem Relation Age of Onset  . Alcohol abuse Neg Hx   . Arthritis Neg Hx   . Asthma Neg Hx   . Birth defects Neg Hx   . COPD Neg Hx   . Depression Neg Hx   . Diabetes Neg Hx   . Drug abuse Neg Hx   . Early death Neg Hx   . Hearing loss Neg Hx   . Heart disease Neg Hx   . Hypertension Neg Hx   . Hyperlipidemia Neg Hx   . Kidney disease Neg Hx   . Learning disabilities Neg Hx   . Mental illness Neg Hx   . Mental retardation Neg Hx   . Miscarriages / Stillbirths Neg Hx   . Stroke Neg Hx   . Vision loss Neg Hx   . Other Neg Hx   . Cancer Maternal Grandmother   . Cancer Maternal Grandfather     Social History: History  Substance Use Topics  . Smoking status: Former Smoker -- 0.25 packs/day for 4 years   Types: Cigarettes    Quit date: 11/21/2013  . Smokeless tobacco: Never Used  . Alcohol Use: Yes     Comment: on occasionsNONE SINCE PREG    Allergies: No Known Allergies  Meds:  No prescriptions prior to admission    ROS: Pertinent findings in history of present illness.  Physical Exam  Blood pressure 135/75, pulse 77, temperature 98.9 F (37.2 C), temperature source Oral, resp. rate 16, height  (1.702 m), weight 73.573 kg (162 lb 3.2 oz), last menstrual period 07/29/2013, SpO2 99.00%. GENERAL: Well-developed, well-nourished female in no acute distress.  HEENT: normocephalic HEART: normal rate RESP: normal effort ABDOMEN: Soft, non-tender, gravid appropriate for gestational age EXTREMITIES: Nontender, no edema NEURO: alert  and oriented SPECULUM EXAM: Deferred  Dilation: 2 Effacement (%): 60 Station: -2 Presentation: Vertex Exam by:: Dorrene German, RN  FHT:  Baseline 130 , moderate variability, accelerations present, no decelerations Contractions: q 2-6 mins, mild   Labs: Results for orders placed during the hospital encounter of 03/31/14 (from the past 24 hour(s))  URINALYSIS, ROUTINE W REFLEX MICROSCOPIC     Status: Abnormal   Collection Time    03/31/14 11:35 AM      Result Value Ref Range   Color, Urine YELLOW  YELLOW   APPearance CLEAR  CLEAR   Specific Gravity, Urine <1.005 (*) 1.005 - 1.030   pH 6.0  5.0 - 8.0   Glucose, UA NEGATIVE  NEGATIVE mg/dL   Hgb urine dipstick NEGATIVE  NEGATIVE   Bilirubin Urine NEGATIVE  NEGATIVE   Ketones, ur NEGATIVE  NEGATIVE mg/dL   Protein, ur NEGATIVE  NEGATIVE mg/dL   Urobilinogen, UA 0.2  0.0 - 1.0 mg/dL   Nitrite NEGATIVE  NEGATIVE   Leukocytes, UA TRACE (*) NEGATIVE  URINE MICROSCOPIC-ADD ON     Status: None   Collection Time    03/31/14 11:35 AM      Result Value Ref Range   Squamous Epithelial / LPF RARE  RARE   WBC, UA 3-6  <3 WBC/hpf   Bacteria, UA RARE  RARE    Imaging:  No results found. MAU Course: Push  fluids.   No cervical change. UC's decreased in frequency and intensity.   Assessment: 1. Preterm contractions, third trimester     Plan: Discharge home ion stable condition. Preterm labor precautions and fetal kick counts Increase fluids and rest F/U w/ Dr. Gaynell Face as scheduled MAU in emergencies, active labor.    Medication List    Notice   You have not been prescribed any medications.      [place], [place] 03/31/2014 12:48 PM

## 2014-04-08 ENCOUNTER — Encounter (HOSPITAL_COMMUNITY): Payer: Self-pay | Admitting: *Deleted

## 2014-04-08 ENCOUNTER — Inpatient Hospital Stay (HOSPITAL_COMMUNITY)
Admission: AD | Admit: 2014-04-08 | Discharge: 2014-04-08 | Disposition: A | Discharge status: Home / Self Care | Payer: 59 | Source: Ambulatory Visit | Attending: Obstetrics | Admitting: Obstetrics

## 2014-04-08 DIAGNOSIS — O4700 False labor before 37 completed weeks of gestation, unspecified trimester: Secondary | ICD-10-CM | POA: Insufficient documentation

## 2014-04-08 DIAGNOSIS — Z3A36 36 weeks gestation of pregnancy: Secondary | ICD-10-CM | POA: Diagnosis present

## 2014-04-08 LAB — URINALYSIS, ROUTINE W REFLEX MICROSCOPIC
BILIRUBIN URINE: NEGATIVE
Glucose, UA: NEGATIVE mg/dL
Hgb urine dipstick: NEGATIVE
Ketones, ur: NEGATIVE mg/dL
Nitrite: NEGATIVE
PH: 6 (ref 5.0–8.0)
Protein, ur: NEGATIVE mg/dL
SPECIFIC GRAVITY, URINE: 1.01 (ref 1.005–1.030)
Urobilinogen, UA: 0.2 mg/dL (ref 0.0–1.0)

## 2014-04-08 LAB — URINE MICROSCOPIC-ADD ON

## 2014-04-08 NOTE — MAU Note (Signed)
Pt up to walk x2 hours.

## 2014-04-08 NOTE — MAU Note (Signed)
Pt states here for contractions q4 minutes that initially began yesterday. Denies bleeding or lof.

## 2014-04-08 NOTE — MAU Note (Signed)
Ctx q 3 min. Denied LOF or VB. +FM. Was sent over from office at 3pm for being 4cm.

## 2014-04-08 NOTE — Discharge Instructions (Signed)
Braxton Hicks Contractions °Contractions of the uterus can occur throughout pregnancy. Contractions are not always a sign that you are in labor.  °WHAT ARE BRAXTON HICKS CONTRACTIONS?  °Contractions that occur before labor are called Braxton Hicks contractions, or false labor. Toward the end of pregnancy (32-34 weeks), these contractions can develop more often and may become more forceful. This is not true labor because these contractions do not result in opening (dilatation) and thinning of the cervix. They are sometimes difficult to tell apart from true labor because these contractions can be forceful and people have different pain tolerances. You should not feel embarrassed if you go to the hospital with false labor. Sometimes, the only way to tell if you are in true labor is for your health care provider to look for changes in the cervix. °If there are no prenatal problems or other health problems associated with the pregnancy, it is completely safe to be sent home with false labor and await the onset of true labor. °HOW CAN YOU TELL THE DIFFERENCE BETWEEN TRUE AND FALSE LABOR? °False Labor °· The contractions of false labor are usually shorter and not as hard as those of true labor.   °· The contractions are usually irregular.   °· The contractions are often felt in the front of the lower abdomen and in the groin.   °· The contractions may go away when you walk around or change positions while lying down.   °· The contractions get weaker and are shorter lasting as time goes on.   °· The contractions do not usually become progressively stronger, regular, and closer together as with true labor.   °True Labor °· Contractions in true labor last 30-70 seconds, become very regular, usually become more intense, and increase in frequency.   °· The contractions do not go away with walking.   °· The discomfort is usually felt in the top of the uterus and spreads to the lower abdomen and low back.   °· True labor can be  determined by your health care provider with an exam. This will show that the cervix is dilating and getting thinner.   °WHAT TO REMEMBER °· Keep up with your usual exercises and follow other instructions given by your health care provider.   °· Take medicines as directed by your health care provider.   °· Keep your regular prenatal appointments.   °· Eat and drink lightly if you think you are going into labor.   °· If Braxton Hicks contractions are making you uncomfortable:   °¨ Change your position from lying down or resting to walking, or from walking to resting.   °¨ Sit and rest in a tub of warm water.   °¨ Drink 2-3 glasses of water. Dehydration may cause these contractions.   °¨ Do slow and deep breathing several times an hour.   °WHEN SHOULD I SEEK IMMEDIATE MEDICAL CARE? °Seek immediate medical care if: °· Your contractions become stronger, more regular, and closer together.   °· You have fluid leaking or gushing from your vagina.   °· You have a fever.   °· You pass blood-tinged mucus.   °· You have vaginal bleeding.   °· You have continuous abdominal pain.   °· You have low back pain that you never had before.   °· You feel your baby's head pushing down and causing pelvic pressure.   °· Your baby is not moving as much as it used to.   °Document Released: 06/26/2005 Document Revised: 07/01/2013 Document Reviewed: 04/07/2013 °[employer]® Patient Information ©2015 [employer]. This information is not intended to replace advice given to you by your health care   provider. Make sure you discuss any questions you have with your health care provider. ° °

## 2014-04-09 ENCOUNTER — Inpatient Hospital Stay (HOSPITAL_COMMUNITY): Payer: 59 | Admitting: Anesthesiology

## 2014-04-09 ENCOUNTER — Inpatient Hospital Stay (HOSPITAL_COMMUNITY)
Admission: AD | Admit: 2014-04-09 | Discharge: 2014-04-11 | DRG: 775 | Disposition: A | Discharge status: Home / Self Care | Payer: 59 | Source: Intra-hospital | Attending: Obstetrics | Admitting: Obstetrics

## 2014-04-09 ENCOUNTER — Encounter (HOSPITAL_COMMUNITY): Payer: 59 | Admitting: Anesthesiology

## 2014-04-09 ENCOUNTER — Encounter (HOSPITAL_COMMUNITY): Payer: Self-pay | Admitting: *Deleted

## 2014-04-09 DIAGNOSIS — IMO0001 Reserved for inherently not codable concepts without codable children: Secondary | ICD-10-CM

## 2014-04-09 DIAGNOSIS — Z3A36 36 weeks gestation of pregnancy: Secondary | ICD-10-CM | POA: Diagnosis present

## 2014-04-09 LAB — CBC
HCT: 31.6 % — ABNORMAL LOW (ref 36.0–46.0)
Hemoglobin: 10.7 g/dL — ABNORMAL LOW (ref 12.0–15.0)
MCH: 30.9 pg (ref 26.0–34.0)
MCHC: 33.9 g/dL (ref 30.0–36.0)
MCV: 91.3 fL (ref 78.0–100.0)
PLATELETS: 428 10*3/uL — AB (ref 150–400)
RBC: 3.46 MIL/uL — ABNORMAL LOW (ref 3.87–5.11)
RDW: 14.2 % (ref 11.5–15.5)
WBC: 15.7 10*3/uL — ABNORMAL HIGH (ref 4.0–10.5)

## 2014-04-09 LAB — ABO/RH: ABO/RH(D): O POS

## 2014-04-09 LAB — TYPE AND SCREEN
ABO/RH(D): O POS
ANTIBODY SCREEN: NEGATIVE

## 2014-04-09 MED ORDER — PHENYLEPHRINE 40 MCG/ML (10ML) SYRINGE FOR IV PUSH (FOR BLOOD PRESSURE SUPPORT)
80.0000 ug | PREFILLED_SYRINGE | INTRAVENOUS | Status: DC | PRN
  Filled 2014-04-09: qty 2

## 2014-04-09 MED ORDER — SODIUM CHLORIDE 0.9 % IV SOLN
2.0000 g | Freq: Four times a day (QID) | INTRAVENOUS | Status: DC
  Administered 2014-04-09: 2 g via INTRAVENOUS
  Filled 2014-04-09 (×3): qty 2000

## 2014-04-09 MED ORDER — SIMETHICONE 80 MG PO CHEW
80.0000 mg | CHEWABLE_TABLET | ORAL | Status: DC | PRN
  Administered 2014-04-10: 80 mg via ORAL
  Filled 2014-04-09: qty 1

## 2014-04-09 MED ORDER — LACTATED RINGERS IV SOLN
500.0000 mL | Freq: Once | INTRAVENOUS | Status: AC
  Administered 2014-04-09: 500 mL via INTRAVENOUS

## 2014-04-09 MED ORDER — OXYCODONE-ACETAMINOPHEN 5-325 MG PO TABS
2.0000 | ORAL_TABLET | ORAL | Status: DC | PRN
  Administered 2014-04-10 – 2014-04-11 (×3): 2 via ORAL
  Filled 2014-04-09 (×4): qty 2

## 2014-04-09 MED ORDER — PRENATAL MULTIVITAMIN CH
1.0000 | ORAL_TABLET | Freq: Every day | ORAL | Status: DC
  Filled 2014-04-09: qty 1

## 2014-04-09 MED ORDER — LANOLIN HYDROUS EX OINT: TOPICAL_OINTMENT | CUTANEOUS | Status: DC | PRN

## 2014-04-09 MED ORDER — BENZOCAINE-MENTHOL 20-0.5 % EX AERO
1.0000 "application " | INHALATION_SPRAY | CUTANEOUS | Status: DC | PRN
  Administered 2014-04-10: 1 via TOPICAL
  Filled 2014-04-09: qty 56

## 2014-04-09 MED ORDER — OXYCODONE-ACETAMINOPHEN 5-325 MG PO TABS
1.0000 | ORAL_TABLET | ORAL | Status: DC | PRN
  Administered 2014-04-10 (×2): 1 via ORAL
  Filled 2014-04-09: qty 1

## 2014-04-09 MED ORDER — ONDANSETRON HCL 4 MG PO TABS: 4.0000 mg | ORAL_TABLET | ORAL | Status: DC | PRN

## 2014-04-09 MED ORDER — BUTORPHANOL TARTRATE 1 MG/ML IJ SOLN: 1.0000 mg | INTRAMUSCULAR | Status: DC | PRN

## 2014-04-09 MED ORDER — EPHEDRINE 5 MG/ML INJ
10.0000 mg | INTRAVENOUS | Status: DC | PRN
  Filled 2014-04-09: qty 2

## 2014-04-09 MED ORDER — TETANUS-DIPHTH-ACELL PERTUSSIS 5-2.5-18.5 LF-MCG/0.5 IM SUSP: 0.5000 mL | Freq: Once | INTRAMUSCULAR | Status: DC

## 2014-04-09 MED ORDER — BUTORPHANOL TARTRATE 1 MG/ML IJ SOLN
INTRAMUSCULAR | Status: AC
  Filled 2014-04-09: qty 1

## 2014-04-09 MED ORDER — BUTORPHANOL TARTRATE 1 MG/ML IJ SOLN
1.0000 mg | Freq: Once | INTRAMUSCULAR | Status: AC
  Administered 2014-04-09: 1 mg via INTRAMUSCULAR

## 2014-04-09 MED ORDER — ZOLPIDEM TARTRATE 5 MG PO TABS: 5.0000 mg | ORAL_TABLET | Freq: Every evening | ORAL | Status: DC | PRN

## 2014-04-09 MED ORDER — SENNOSIDES-DOCUSATE SODIUM 8.6-50 MG PO TABS
2.0000 | ORAL_TABLET | ORAL | Status: DC
  Administered 2014-04-10 – 2014-04-11 (×2): 2 via ORAL
  Filled 2014-04-09 (×2): qty 2

## 2014-04-09 MED ORDER — FLEET ENEMA 7-19 GM/118ML RE ENEM: 1.0000 | ENEMA | RECTAL | Status: DC | PRN

## 2014-04-09 MED ORDER — OXYTOCIN BOLUS FROM INFUSION
500.0000 mL | INTRAVENOUS | Status: DC
  Administered 2014-04-09: 500 mL via INTRAVENOUS

## 2014-04-09 MED ORDER — LACTATED RINGERS IV SOLN: 500.0000 mL | INTRAVENOUS | Status: DC | PRN

## 2014-04-09 MED ORDER — ACETAMINOPHEN 325 MG PO TABS: 650.0000 mg | ORAL_TABLET | ORAL | Status: DC | PRN

## 2014-04-09 MED ORDER — DIPHENHYDRAMINE HCL 25 MG PO CAPS: 25.0000 mg | ORAL_CAPSULE | Freq: Four times a day (QID) | ORAL | Status: DC | PRN

## 2014-04-09 MED ORDER — FENTANYL 2.5 MCG/ML BUPIVACAINE 1/10 % EPIDURAL INFUSION (WH - ANES): 14.0000 mL/h | INTRAMUSCULAR | Status: DC | PRN

## 2014-04-09 MED ORDER — CITRIC ACID-SODIUM CITRATE 334-500 MG/5ML PO SOLN: 30.0000 mL | ORAL | Status: DC | PRN

## 2014-04-09 MED ORDER — LIDOCAINE HCL (PF) 1 % IJ SOLN
30.0000 mL | INTRAMUSCULAR | Status: DC | PRN
  Filled 2014-04-09: qty 30

## 2014-04-09 MED ORDER — FENTANYL 2.5 MCG/ML BUPIVACAINE 1/10 % EPIDURAL INFUSION (WH - ANES)
14.0000 mL/h | INTRAMUSCULAR | Status: DC | PRN
  Administered 2014-04-09: 14 mL/h via EPIDURAL

## 2014-04-09 MED ORDER — IBUPROFEN 600 MG PO TABS
600.0000 mg | ORAL_TABLET | Freq: Four times a day (QID) | ORAL | Status: DC
  Administered 2014-04-09 – 2014-04-11 (×7): 600 mg via ORAL
  Filled 2014-04-09 (×7): qty 1

## 2014-04-09 MED ORDER — OXYTOCIN 40 UNITS IN LACTATED RINGERS INFUSION - SIMPLE MED
62.5000 mL/h | INTRAVENOUS | Status: DC
  Administered 2014-04-09: 62.5 mL/h via INTRAVENOUS
  Filled 2014-04-09: qty 1000

## 2014-04-09 MED ORDER — LIDOCAINE HCL (PF) 1 % IJ SOLN
INTRAMUSCULAR | Status: DC | PRN
  Administered 2014-04-09: 6 mL
  Administered 2014-04-09: 4 mL

## 2014-04-09 MED ORDER — WITCH HAZEL-GLYCERIN EX PADS
1.0000 | MEDICATED_PAD | CUTANEOUS | Status: DC | PRN
Start: 2014-04-09 — End: 2014-04-11

## 2014-04-09 MED ORDER — FENTANYL 2.5 MCG/ML BUPIVACAINE 1/10 % EPIDURAL INFUSION (WH - ANES)
INTRAMUSCULAR | Status: AC
  Administered 2014-04-09: 14 mL/h via EPIDURAL
  Filled 2014-04-09: qty 125

## 2014-04-09 MED ORDER — ONDANSETRON HCL 4 MG/2ML IJ SOLN: 4.0000 mg | INTRAMUSCULAR | Status: DC | PRN

## 2014-04-09 MED ORDER — OXYCODONE-ACETAMINOPHEN 5-325 MG PO TABS
1.0000 | ORAL_TABLET | ORAL | Status: DC | PRN
  Administered 2014-04-09: 1 via ORAL
  Filled 2014-04-09: qty 1

## 2014-04-09 MED ORDER — ONDANSETRON HCL 4 MG/2ML IJ SOLN: 4.0000 mg | Freq: Four times a day (QID) | INTRAMUSCULAR | Status: DC | PRN

## 2014-04-09 MED ORDER — OXYCODONE-ACETAMINOPHEN 5-325 MG PO TABS: 2.0000 | ORAL_TABLET | ORAL | Status: DC | PRN

## 2014-04-09 MED ORDER — DIBUCAINE 1 % RE OINT: 1.0000 "application " | TOPICAL_OINTMENT | RECTAL | Status: DC | PRN

## 2014-04-09 MED ORDER — DIPHENHYDRAMINE HCL 50 MG/ML IJ SOLN: 12.5000 mg | INTRAMUSCULAR | Status: DC | PRN

## 2014-04-09 MED ORDER — FERROUS SULFATE 325 (65 FE) MG PO TABS
325.0000 mg | ORAL_TABLET | Freq: Two times a day (BID) | ORAL | Status: DC
  Administered 2014-04-10 – 2014-04-11 (×3): 325 mg via ORAL
  Filled 2014-04-09 (×3): qty 1

## 2014-04-09 MED ORDER — LACTATED RINGERS IV SOLN
INTRAVENOUS | Status: DC
  Administered 2014-04-09: 16:00:00 via INTRAVENOUS

## 2014-04-09 MED ORDER — PHENYLEPHRINE 40 MCG/ML (10ML) SYRINGE FOR IV PUSH (FOR BLOOD PRESSURE SUPPORT)
PREFILLED_SYRINGE | INTRAVENOUS | Status: AC
  Filled 2014-04-09: qty 10

## 2014-04-09 NOTE — Anesthesia Procedure Notes (Signed)
Spinal  Patient location during procedure: OR Preanesthetic Checklist Completed: patient identified, site marked, surgical consent, pre-op evaluation, timeout performed, IV checked, risks and benefits discussed and monitors and equipment checked Spinal Block Patient position: sitting Prep: DuraPrep Patient monitoring: heart rate, cardiac monitor, continuous pulse ox and blood pressure Approach: midline Location: L3-4 Injection technique: single-shot Needle Needle type: Sprotte  Needle gauge: 24 G Needle length: 9 cm Assessment Sensory level: T4 Additional Notes Dosing of Epidural:  1st dose, through catheter ............................................Marland Kitchen.  Xylocaine 40 mg  2nd dose, through catheter, after waiting 3 minutes........Marland Kitchen.Xylocaine 60 mg    ( 1% Xylo charted as a single dose in Epic Meds for ease of charting; actual dosing was fractionated as above, for saftey's sake)  As each dose occurred, patient was free of IV sx; and patient exhibited no evidence of SA injection.  Patient is more comfortable after epidural dosed. Please see RN's note for documentation of vital signs,and FHR which are stable.  Patient reminded not to try to ambulate with numb legs, and that an RN must be present when she attempts to get up.

## 2014-04-09 NOTE — Plan of Care (Signed)
Problem: Phase II Progression Outcomes Goal: Initiate breastfeeding within 1hr delivery Outcome: Not Met (add Reason) Mother's choice to formula feed; benefits of breastfeeding and risk of formula discussed

## 2014-04-09 NOTE — H&P (Signed)
Is Dr. Francoise CeoBernard Analleli Gierke dictating the history and physical on  Tonya David she's a 28 year old gravida 9 para 4135 at 5036 weeks and 2 days EDC 10/27 15 negative GBS unknown she was admitted at 6 cm dilated vertex -2 station and and and soma she got her epidural at 4.9 her membranes ruptured spontaneously fluid clear and she had a normal vaginal delivery of a female Apgar 8 and 9 placenta spontaneous no episiotomy or laceration Past medical history negative Past surgical history negative Social history negative System review noncontributory Physical exam well-developed female post delivery HEENT negative Lungs clear to P&A Heart regular rhythm no murmurs no gallops breasts negative Uterus 20 week postpartum size Pelvic as described above Extremities negative

## 2014-04-09 NOTE — MAU Note (Signed)
Contractions have gotten worse, now every 2 min.  Vomiting this a.m.

## 2014-04-09 NOTE — Anesthesia Preprocedure Evaluation (Signed)

## 2014-04-10 LAB — CBC
HEMATOCRIT: 28.6 % — AB (ref 36.0–46.0)
HEMOGLOBIN: 9.4 g/dL — AB (ref 12.0–15.0)
MCH: 30.1 pg (ref 26.0–34.0)
MCHC: 32.9 g/dL (ref 30.0–36.0)
MCV: 91.7 fL (ref 78.0–100.0)
Platelets: 378 10*3/uL (ref 150–400)
RBC: 3.12 MIL/uL — AB (ref 3.87–5.11)
RDW: 14.2 % (ref 11.5–15.5)
WBC: 17.9 10*3/uL — AB (ref 4.0–10.5)

## 2014-04-10 LAB — RPR

## 2014-04-10 NOTE — Progress Notes (Signed)
Patient ID: Tonya CollardJammie L Latu, female   DOB: 11/17/1985, 28 y.o.   MRN: [id number] Postpartum day one Vital signs normal Fundus firm Lochia moderate Doing well

## 2014-04-10 NOTE — Anesthesia Postprocedure Evaluation (Signed)
  Anesthesia Post-op Note  Patient: Arvin CollardJammie L Drudge  Procedure(s) Performed: * No procedures listed *  Patient Location: PACU and Mother/Baby  Anesthesia Type:Epidural  Level of Consciousness: awake, alert  and oriented  Airway and Oxygen Therapy: Patient Spontanous Breathing  Post-op Pain: mild  Post-op Assessment: Patient's Cardiovascular Status Stable, Respiratory Function Stable, No signs of Nausea or vomiting, Adequate PO intake, Pain level controlled, No headache, No backache, No residual numbness and No residual motor weakness  Post-op Vital Signs: Reviewed and stable  Last Vitals:  Filed Vitals:   04/10/14 0445  BP: 122/74  Pulse: 78  Temp: 36.8 C  Resp: 18    Complications: No apparent anesthesia complications

## 2014-04-11 MED ORDER — OXYCODONE-ACETAMINOPHEN 5-325 MG PO TABS: 1.0000 | ORAL_TABLET | ORAL | Status: DC | PRN

## 2014-04-11 MED ORDER — NORETHIN ACE-ETH ESTRAD-FE 1-20 MG-MCG(24) PO TABS: 1.0000 | ORAL_TABLET | ORAL | Status: DC

## 2014-04-11 NOTE — Discharge Instructions (Signed)

## 2014-04-11 NOTE — Discharge Summary (Signed)
  Obstetric Discharge Summary Reason for Admission: onset of labor Prenatal Procedures: none Intrapartum Procedures: spontaneous vaginal delivery Postpartum Procedures: none Complications-Operative and Postpartum: none  Hemoglobin  Date Value Ref Range Status  04/10/2014 9.4* 12.0 - 15.0 g/dL Final     HCT  Date Value Ref Range Status  04/10/2014 28.6* 36.0 - 46.0 % Final    Physical Exam:  General: alert Lochia: appropriate Uterine: firm Incision: n/a DVT Evaluation: No evidence of DVT seen on physical exam.  Discharge Diagnoses: Active Problems:   Active labor   NVD (normal vaginal delivery)   Discharge Information: Date: 04/11/2014 Activity: pelvic rest Diet: routine Medications:  Prior to Admission medications   Medication Sig Start Date End Date Taking? Authorizing Provider  Norethindrone Acetate-Ethinyl Estrad-FE (LOESTRIN 24 FE) 1-20 MG-MCG(24) tablet Take 1 tablet by mouth 1 day or 1 dose. Start in 3 weeks 04/11/14   Antionette CharLisa Jackson-Moore, MD  oxyCODONE-acetaminophen (PERCOCET/ROXICET) 5-325 MG per tablet Take 1 tablet by mouth every 4 (four) hours as needed (for pain scale less than 7). 04/11/14   Antionette CharLisa Jackson-Moore, MD    Condition: stable Instructions: refer to routine discharge instructions Discharge to: home Follow-up Information   Follow up with MARSHALL,BERNARD A, MD. Schedule an appointment as soon as possible for a visit in 2 weeks.   Specialty:  Obstetrics and Gynecology   Contact information:   [address] [place] [address] [phone]       Newborn Data:  Live born female  Birth Weight: 5 lb 8.7 oz (2515 g) APGAR: 9, 9   Home with mother.  JACKSON-MOORE,Dal Blew A 04/11/2014, 11:05 AM

## 2014-05-11 ENCOUNTER — Encounter (HOSPITAL_COMMUNITY): Payer: Self-pay | Admitting: *Deleted

## 2014-08-24 ENCOUNTER — Emergency Department (HOSPITAL_COMMUNITY)
Admission: EM | Admit: 2014-08-24 | Discharge: 2014-08-24 | Disposition: A | Discharge status: Home / Self Care | Payer: 59 | Attending: Emergency Medicine | Admitting: Emergency Medicine

## 2014-08-24 ENCOUNTER — Encounter (HOSPITAL_COMMUNITY): Payer: Self-pay | Admitting: Adult Health

## 2014-08-24 DIAGNOSIS — Z3202 Encounter for pregnancy test, result negative: Secondary | ICD-10-CM | POA: Diagnosis not present

## 2014-08-24 DIAGNOSIS — Z8751 Personal history of pre-term labor: Secondary | ICD-10-CM | POA: Insufficient documentation

## 2014-08-24 DIAGNOSIS — Z791 Long term (current) use of non-steroidal anti-inflammatories (NSAID): Secondary | ICD-10-CM | POA: Diagnosis not present

## 2014-08-24 DIAGNOSIS — Z87440 Personal history of urinary (tract) infections: Secondary | ICD-10-CM | POA: Insufficient documentation

## 2014-08-24 DIAGNOSIS — Z862 Personal history of diseases of the blood and blood-forming organs and certain disorders involving the immune mechanism: Secondary | ICD-10-CM | POA: Insufficient documentation

## 2014-08-24 DIAGNOSIS — R1033 Periumbilical pain: Secondary | ICD-10-CM | POA: Diagnosis not present

## 2014-08-24 DIAGNOSIS — Z87891 Personal history of nicotine dependence: Secondary | ICD-10-CM | POA: Insufficient documentation

## 2014-08-24 DIAGNOSIS — R109 Unspecified abdominal pain: Secondary | ICD-10-CM

## 2014-08-24 LAB — COMPREHENSIVE METABOLIC PANEL
ALK PHOS: 66 U/L (ref 39–117)
ALT: 17 U/L (ref 0–35)
AST: 18 U/L (ref 0–37)
Albumin: 4.5 g/dL (ref 3.5–5.2)
Anion gap: 7 (ref 5–15)
BUN: 11 mg/dL (ref 6–23)
CO2: 23 mmol/L (ref 19–32)
Calcium: 9.2 mg/dL (ref 8.4–10.5)
Chloride: 107 mmol/L (ref 96–112)
Creatinine, Ser: 0.69 mg/dL (ref 0.50–1.10)
GFR calc Af Amer: >90 mL/min (ref >90)
GFR calc non Af Amer: >90 mL/min (ref >90)
Glucose, Bld: 105 mg/dL — ABNORMAL HIGH (ref 70–99)
POTASSIUM: 3.5 mmol/L (ref 3.5–5.1)
Sodium: 137 mmol/L (ref 135–145)
Total Bilirubin: 0.6 mg/dL (ref 0.3–1.2)
Total Protein: 7.4 g/dL (ref 6.0–8.3)

## 2014-08-24 LAB — URINALYSIS, ROUTINE W REFLEX MICROSCOPIC
BILIRUBIN URINE: NEGATIVE
GLUCOSE, UA: NEGATIVE mg/dL
Hgb urine dipstick: NEGATIVE
KETONES UR: NEGATIVE mg/dL
Nitrite: NEGATIVE
Protein, ur: NEGATIVE mg/dL
SPECIFIC GRAVITY, URINE: 1.031 — AB (ref 1.005–1.030)
Urobilinogen, UA: 1 mg/dL (ref 0.0–1.0)
pH: 7.5 (ref 5.0–8.0)

## 2014-08-24 LAB — URINE MICROSCOPIC-ADD ON

## 2014-08-24 LAB — CBC WITH DIFFERENTIAL/PLATELET
BASOS ABS: 0 10*3/uL (ref 0.0–0.1)
Basophils Relative: 1 % (ref 0–1)
EOS PCT: 4 % (ref 0–5)
Eosinophils Absolute: 0.3 10*3/uL (ref 0.0–0.7)
HEMATOCRIT: 34.6 % — AB (ref 36.0–46.0)
Hemoglobin: 11.5 g/dL — ABNORMAL LOW (ref 12.0–15.0)
Lymphocytes Relative: 39 % (ref 12–46)
Lymphs Abs: 2.7 10*3/uL (ref 0.7–4.0)
MCH: 29.2 pg (ref 26.0–34.0)
MCHC: 33.2 g/dL (ref 30.0–36.0)
MCV: 87.8 fL (ref 78.0–100.0)
MONO ABS: 0.9 10*3/uL (ref 0.1–1.0)
Monocytes Relative: 13 % — ABNORMAL HIGH (ref 3–12)
Neutro Abs: 3 10*3/uL (ref 1.7–7.7)
Neutrophils Relative %: 43 % (ref 43–77)
Platelets: 415 10*3/uL — ABNORMAL HIGH (ref 150–400)
RBC: 3.94 MIL/uL (ref 3.87–5.11)
RDW: 12.9 % (ref 11.5–15.5)
WBC: 6.9 10*3/uL (ref 4.0–10.5)

## 2014-08-24 LAB — LIPASE, BLOOD: LIPASE: 32 U/L (ref 11–59)

## 2014-08-24 LAB — POC URINE PREG, ED: Preg Test, Ur: NEGATIVE

## 2014-08-24 MED ORDER — OXYCODONE-ACETAMINOPHEN 5-325 MG PO TABS
2.0000 | ORAL_TABLET | Freq: Once | ORAL | Status: AC
  Administered 2014-08-24: 2 via ORAL
  Filled 2014-08-24: qty 2

## 2014-08-24 MED ORDER — OXYCODONE-ACETAMINOPHEN 5-325 MG PO TABS: 1.0000 | ORAL_TABLET | ORAL | Status: DC | PRN

## 2014-08-24 NOTE — Discharge Instructions (Signed)

## 2014-08-24 NOTE — ED Provider Notes (Signed)
CSN: [id number]     Arrival date & time 08/24/14  1648 History   First MD Initiated Contact with Patient 08/24/14 1810     Chief Complaint  Patient presents with  . Abdominal Pain     Patient is a 29 y.o. female presenting with abdominal pain. The history is provided by the patient.  Abdominal Pain Pain location:  Periumbilical Pain quality: cramping   Pain radiates to:  Does not radiate Onset quality:  Gradual Duration: 1 month. Timing:  Intermittent Progression:  Unchanged Chronicity:  New Relieved by:  None tried Worsened by:  Nothing tried Associated symptoms: no chest pain, no constipation, no diarrhea, no dysuria, no fever, no nausea, no shortness of breath, no vaginal bleeding, no vaginal discharge and no vomiting   Patient reports she has had intermittent abd pain for one month She reports it is cramping No other associated symptoms Denies STD exposure  Past Medical History  Diagnosis Date  . Preterm labor   . Anemia   . Urinary tract infection   . UVOZDGUY(403.4)    Past Surgical History  Procedure Laterality Date  . Therapeutic abortion     Family History  Problem Relation Age of Onset  . Alcohol abuse Neg Hx   . Arthritis Neg Hx   . Asthma Neg Hx   . Birth defects Neg Hx   . COPD Neg Hx   . Depression Neg Hx   . Diabetes Neg Hx   . Drug abuse Neg Hx   . Early death Neg Hx   . Hearing loss Neg Hx   . Heart disease Neg Hx   . Hypertension Neg Hx   . Hyperlipidemia Neg Hx   . Kidney disease Neg Hx   . Learning disabilities Neg Hx   . Mental illness Neg Hx   . Mental retardation Neg Hx   . Miscarriages / Stillbirths Neg Hx   . Stroke Neg Hx   . Vision loss Neg Hx   . Other Neg Hx   . Cancer Maternal Grandmother   . Cancer Maternal Grandfather    History  Substance Use Topics  . Smoking status: Former Smoker -- 0.25 packs/day for 4 years    Types: Cigarettes    Quit date: 11/21/2013  . Smokeless tobacco: Never Used  . Alcohol Use: No   Comment: on occasionsNONE SINCE PREG   OB History    Gravida Para Term Preterm AB TAB SAB Ectopic Multiple Living   0 0 6     Review of Systems  Constitutional: Negative for fever.  Respiratory: Negative for shortness of breath.   Cardiovascular: Negative for chest pain.  Gastrointestinal: Positive for abdominal pain. Negative for nausea, vomiting, diarrhea and constipation.  Genitourinary: Negative for dysuria, vaginal bleeding, vaginal discharge and dyspareunia.  All other systems reviewed and are negative.     Allergies  Review of patient's allergies indicates no known allergies.  Home Medications   Prior to Admission medications   Medication Sig Start Date End Date Taking? Authorizing Provider  acetaminophen (TYLENOL) 325 MG tablet Take 650 mg by mouth every 6 (six) hours as needed for mild pain.   Yes Historical Provider, MD  Ibuprofen (MIDOL PO) Take 2 tablets by mouth daily as needed (pain).   Yes Historical Provider, MD  medroxyPROGESTERone (DEPO-PROVERA) 150 MG/ML injection Inject 150 mg into the muscle every 3 (three) months.   Yes Historical Provider, MD  oxyCODONE-acetaminophen (PERCOCET/ROXICET) 5-325 MG  per tablet Take 1 tablet by mouth every 4 (four) hours as needed for severe pain. 08/24/14   Joya Gaskinsonald W Evvie Behrmann, MD   BP 133/78 mmHg  Pulse 82  Temp(Src) 98.4 F (36.9 C) (Oral)  Resp 18  Ht 5\' 6"  (1.676 m)  Wt 145 lb (65.772 kg)  BMI 23.41 kg/m2  SpO2 98%  Breastfeeding? No Physical Exam CONSTITUTIONAL: Well developed/well nourished HEAD: Normocephalic/atraumatic EYES: EOMI/PERRL ENMT: Mucous membranes moist NECK: supple no meningeal signs SPINE/BACK:entire spine nontender CV: S1/S2 noted, no murmurs/rubs/gallops noted LUNGS: Lungs are clear to auscultation bilaterally, no apparent distress ABDOMEN: soft, nontender, no rebound or guarding, bowel sounds noted throughout abdomen GU:no cva tenderness. No cmt.  No vag bleeding/discharge.  No  adnexal mass/tenderness.  White lesions on cervix.no bleeding noted. Female chaperone present NEURO: Pt is awake/alert/appropriate, moves all extremitiesx4.  No facial droop.   EXTREMITIES: pulses normal/equal, full ROM SKIN: warm, color normal PSYCH: no abnormalities of mood noted, alert and oriented to situation  ED Course  Procedures  Pt well appearing, intermittent abd pain for one month, exam unremarkable Advised need for GYN f/u - we discussed lesions on cervix and need for GYN f/u and likely need for PAP Otherwise she is appropriate for d/c home   Labs Review Labs Reviewed  COMPREHENSIVE METABOLIC PANEL - Abnormal; Notable for the following:    Glucose, Bld 105 (*)    All other components within normal limits  CBC WITH DIFFERENTIAL/PLATELET - Abnormal; Notable for the following:    Hemoglobin 11.5 (*)    HCT 34.6 (*)    Platelets 415 (*)    Monocytes Relative 13 (*)    All other components within normal limits  URINALYSIS, ROUTINE W REFLEX MICROSCOPIC - Abnormal; Notable for the following:    Specific Gravity, Urine 1.031 (*)    Leukocytes, UA TRACE (*)    All other components within normal limits  URINE MICROSCOPIC-ADD ON - Abnormal; Notable for the following:    Squamous Epithelial / LPF MANY (*)    Bacteria, UA FEW (*)    All other components within normal limits  LIPASE, BLOOD  POC URINE PREG, ED   Medications  oxyCODONE-acetaminophen (PERCOCET/ROXICET) 5-325 MG per tablet 2 tablet (2 tablets Oral Given 08/24/14 1902)     MDM   Final diagnoses:  Abdominal pain, unspecified abdominal location    Nursing notes including past medical history and social history reviewed and considered in documentation Labs/vital reviewed myself and considered during evaluation     Joya Gaskinsonald W Warnell Rasnic, MD 08/24/14 1934

## 2014-08-24 NOTE — ED Notes (Addendum)
Presents with 1 month of mid abdominal pain described as cramping and associated with bloating. Denies nausea, vomiting. Pain radiates into back. LMP-on depo. L BM today normal. Abdomen is distended and soft, pt is 4 months post partum.

## 2014-09-01 ENCOUNTER — Emergency Department (HOSPITAL_COMMUNITY)
Admission: EM | Admit: 2014-09-01 | Discharge: 2014-09-01 | Disposition: A | Discharge status: Home / Self Care | Payer: 59 | Attending: Emergency Medicine | Admitting: Emergency Medicine

## 2014-09-01 ENCOUNTER — Encounter (HOSPITAL_COMMUNITY): Payer: Self-pay

## 2014-09-01 ENCOUNTER — Emergency Department (HOSPITAL_COMMUNITY): Payer: 59

## 2014-09-01 DIAGNOSIS — R1012 Left upper quadrant pain: Secondary | ICD-10-CM | POA: Insufficient documentation

## 2014-09-01 DIAGNOSIS — R1011 Right upper quadrant pain: Secondary | ICD-10-CM | POA: Insufficient documentation

## 2014-09-01 DIAGNOSIS — Z862 Personal history of diseases of the blood and blood-forming organs and certain disorders involving the immune mechanism: Secondary | ICD-10-CM | POA: Diagnosis not present

## 2014-09-01 DIAGNOSIS — Z3202 Encounter for pregnancy test, result negative: Secondary | ICD-10-CM | POA: Insufficient documentation

## 2014-09-01 DIAGNOSIS — R140 Abdominal distension (gaseous): Secondary | ICD-10-CM | POA: Insufficient documentation

## 2014-09-01 DIAGNOSIS — R1013 Epigastric pain: Secondary | ICD-10-CM

## 2014-09-01 DIAGNOSIS — M549 Dorsalgia, unspecified: Secondary | ICD-10-CM | POA: Diagnosis not present

## 2014-09-01 DIAGNOSIS — Z87891 Personal history of nicotine dependence: Secondary | ICD-10-CM | POA: Diagnosis not present

## 2014-09-01 DIAGNOSIS — Z87440 Personal history of urinary (tract) infections: Secondary | ICD-10-CM | POA: Insufficient documentation

## 2014-09-01 LAB — URINALYSIS, ROUTINE W REFLEX MICROSCOPIC
Bilirubin Urine: NEGATIVE
Glucose, UA: NEGATIVE mg/dL
Hgb urine dipstick: NEGATIVE
Ketones, ur: NEGATIVE mg/dL
Nitrite: NEGATIVE
Protein, ur: NEGATIVE mg/dL
Specific Gravity, Urine: 1.024 (ref 1.005–1.030)
UROBILINOGEN UA: 1 mg/dL (ref 0.0–1.0)
pH: 6.5 (ref 5.0–8.0)

## 2014-09-01 LAB — CBC WITH DIFFERENTIAL/PLATELET
Basophils Absolute: 0 10*3/uL (ref 0.0–0.1)
Basophils Relative: 1 % (ref 0–1)
Eosinophils Absolute: 0.2 10*3/uL (ref 0.0–0.7)
Eosinophils Relative: 3 % (ref 0–5)
HCT: 35.1 % — ABNORMAL LOW (ref 36.0–46.0)
Hemoglobin: 11.4 g/dL — ABNORMAL LOW (ref 12.0–15.0)
LYMPHS PCT: 32 % (ref 12–46)
Lymphs Abs: 1.7 10*3/uL (ref 0.7–4.0)
MCH: 28.8 pg (ref 26.0–34.0)
MCHC: 32.5 g/dL (ref 30.0–36.0)
MCV: 88.6 fL (ref 78.0–100.0)
MONO ABS: 0.6 10*3/uL (ref 0.1–1.0)
MONOS PCT: 11 % (ref 3–12)
NEUTROS PCT: 53 % (ref 43–77)
Neutro Abs: 2.8 10*3/uL (ref 1.7–7.7)
Platelets: 377 10*3/uL (ref 150–400)
RBC: 3.96 MIL/uL (ref 3.87–5.11)
RDW: 13.3 % (ref 11.5–15.5)
WBC: 5.4 10*3/uL (ref 4.0–10.5)

## 2014-09-01 LAB — COMPREHENSIVE METABOLIC PANEL
ALK PHOS: 57 U/L (ref 39–117)
ALT: 30 U/L (ref 0–35)
AST: 26 U/L (ref 0–37)
Albumin: 4.4 g/dL (ref 3.5–5.2)
Anion gap: 6 (ref 5–15)
BILIRUBIN TOTAL: 0.6 mg/dL (ref 0.3–1.2)
BUN: 12 mg/dL (ref 6–23)
CALCIUM: 9.7 mg/dL (ref 8.4–10.5)
CO2: 23 mmol/L (ref 19–32)
Chloride: 111 mmol/L (ref 96–112)
Creatinine, Ser: 0.63 mg/dL (ref 0.50–1.10)
GFR calc non Af Amer: >90 mL/min (ref >90)
Glucose, Bld: 114 mg/dL — ABNORMAL HIGH (ref 70–99)
Potassium: 3.8 mmol/L (ref 3.5–5.1)
Sodium: 140 mmol/L (ref 135–145)
TOTAL PROTEIN: 7 g/dL (ref 6.0–8.3)

## 2014-09-01 LAB — POC URINE PREG, ED: PREG TEST UR: NEGATIVE

## 2014-09-01 LAB — URINE MICROSCOPIC-ADD ON

## 2014-09-01 LAB — LIPASE, BLOOD: LIPASE: 31 U/L (ref 11–59)

## 2014-09-01 MED ORDER — SODIUM CHLORIDE 0.9 % IV BOLUS (SEPSIS)
500.0000 mL | Freq: Once | INTRAVENOUS | Status: AC
  Administered 2014-09-01: 500 mL via INTRAVENOUS

## 2014-09-01 MED ORDER — HYDROMORPHONE HCL 1 MG/ML IJ SOLN
0.5000 mg | Freq: Once | INTRAMUSCULAR | Status: AC
Start: 2014-09-01 — End: 2014-09-01
  Administered 2014-09-01: 0.5 mg via INTRAVENOUS
  Filled 2014-09-01: qty 1

## 2014-09-01 MED ORDER — IOHEXOL 300 MG/ML  SOLN
25.0000 mL | INTRAMUSCULAR | Status: AC
  Administered 2014-09-01: 25 mL via ORAL

## 2014-09-01 MED ORDER — IOHEXOL 300 MG/ML  SOLN
100.0000 mL | Freq: Once | INTRAMUSCULAR | Status: AC | PRN
  Administered 2014-09-01: 100 mL via INTRAVENOUS

## 2014-09-01 NOTE — ED Provider Notes (Signed)
CSN: [id number]     Arrival date & time 09/01/14  1010 History   First MD Initiated Contact with Patient 09/01/14 1058     Chief Complaint  Patient presents with  . Abdominal Pain  . Back Pain   (Consider location/radiation/quality/duration/timing/severity/associated sxs/prior Treatment) Patient is a 29 y.o. female presenting with abdominal pain and back pain. The history is provided by the patient. No language interpreter was used.  Abdominal Pain Pain location:  Epigastric and periumbilical Pain quality: aching and fullness   Pain severity:  Moderate Onset quality:  Gradual Duration:  4 weeks Timing:  Intermittent Progression:  Waxing and waning Chronicity:  New Context: not awakening from sleep, not diet changes, not eating, not laxative use, not previous surgeries, not recent illness, not retching, not sick contacts, not suspicious food intake and not trauma   Relieved by:  Nothing Worsened by:  Coughing, movement, palpation and position changes Ineffective treatments:  OTC medications Associated symptoms: no anorexia, no belching, no chest pain, no chills, no cough, no diarrhea, no dysuria, no fever, no hematemesis, no hematochezia, no hematuria, no melena, no nausea, no shortness of breath, no vaginal bleeding, no vaginal discharge and no vomiting   Risk factors: has not had multiple surgeries, no NSAID use and not pregnant   Back Pain Associated symptoms: abdominal pain   Associated symptoms: no chest pain, no dysuria, no fever, no headaches, no numbness, no pelvic pain and no weakness     Past Medical History  Diagnosis Date  . Preterm labor   . Anemia   . Urinary tract infection   . BJYNWGNF(621.3Headache(784.0)    Past Surgical History  Procedure Laterality Date  . Therapeutic abortion     Family History  Problem Relation Age of Onset  . Alcohol abuse Neg Hx   . Arthritis Neg Hx   . Asthma Neg Hx   . Birth defects Neg Hx   . COPD Neg Hx   . Depression Neg Hx   . Diabetes  Neg Hx   . Drug abuse Neg Hx   . Early death Neg Hx   . Hearing loss Neg Hx   . Heart disease Neg Hx   . Hypertension Neg Hx   . Hyperlipidemia Neg Hx   . Kidney disease Neg Hx   . Learning disabilities Neg Hx   . Mental illness Neg Hx   . Mental retardation Neg Hx   . Miscarriages / Stillbirths Neg Hx   . Stroke Neg Hx   . Vision loss Neg Hx   . Other Neg Hx   . Cancer Maternal Grandmother   . Cancer Maternal Grandfather    History  Substance Use Topics  . Smoking status: Former Smoker -- 0.25 packs/day for 4 years    Types: Cigarettes    Quit date: 11/21/2013  . Smokeless tobacco: Never Used  . Alcohol Use: No     Comment: on occasionsNONE SINCE PREG   OB History    Gravida Para Term Preterm AB TAB SAB Ectopic Multiple Living   9 6 2 4 3 2 1  0 0 6     Review of Systems  Constitutional: Negative for fever and chills.  Respiratory: Negative for cough and shortness of breath.   Cardiovascular: Negative for chest pain.  Gastrointestinal: Positive for abdominal pain and abdominal distention. Negative for nausea, vomiting, diarrhea, melena, hematochezia, anorexia and hematemesis.  Genitourinary: Negative for dysuria, hematuria, vaginal bleeding, vaginal discharge and pelvic pain.  Musculoskeletal: Positive for  back pain. Negative for myalgias.  Skin: Negative for rash.  Neurological: Negative for dizziness, weakness, light-headedness, numbness and headaches.  Hematological: Negative for adenopathy. Does not bruise/bleed easily.  All other systems reviewed and are negative.     Allergies  Review of patient's allergies indicates no known allergies.  Home Medications   Prior to Admission medications   Medication Sig Start Date End Date Taking? Authorizing Provider  acetaminophen (TYLENOL) 325 MG tablet Take 650 mg by mouth every 6 (six) hours as needed for mild pain.    Historical Provider, MD  Ibuprofen (MIDOL PO) Take 2 tablets by mouth daily as needed (pain).     Historical Provider, MD  medroxyPROGESTERone (DEPO-PROVERA) 150 MG/ML injection Inject 150 mg into the muscle every 3 (three) months.    Historical Provider, MD  oxyCODONE-acetaminophen (PERCOCET/ROXICET) 5-325 MG per tablet Take 1 tablet by mouth every 4 (four) hours as needed for severe pain. 08/24/14   Joya Gaskins, MD   BP 125/69 mmHg  Pulse 94  Temp(Src) 98.4 F (36.9 C) (Oral)  Resp 18  Ht  (1.676 m)  Wt 145 lb (65.772 kg)  BMI 23.41 kg/m2  SpO2 96% Physical Exam  Constitutional: She is oriented to person, place, and time. She appears well-developed and well-nourished.  HENT:  Head: Normocephalic and atraumatic.  Right Ear: External ear normal.  Left Ear: External ear normal.  Mouth/Throat: Oropharynx is clear and moist.  Eyes: Conjunctivae and EOM are normal. Pupils are equal, round, and reactive to light.  Neck: Normal range of motion. Neck supple.  Cardiovascular: Normal rate, regular rhythm, normal heart sounds and intact distal pulses.   Pulmonary/Chest: Effort normal and breath sounds normal. No respiratory distress. She has no wheezes. She has no rales. She exhibits no tenderness.  Abdominal: Soft. Bowel sounds are normal. She exhibits distension. She exhibits no mass. There is tenderness. There is no rebound and no guarding.  Mild epigastric, LUQ, RUQ TTP.  Some abdominal fullness, but non tympanitic, soft, no guarding, no rebound.   Musculoskeletal: Normal range of motion.  Neurological: She is alert and oriented to person, place, and time.  Skin: Skin is warm and dry.  Nursing note and vitals reviewed.   ED Course  Procedures (including critical care time) Labs Review Labs Reviewed  CBC WITH DIFFERENTIAL/PLATELET - Abnormal; Notable for the following:    Hemoglobin 11.4 (*)    HCT 35.1 (*)    All other components within normal limits  COMPREHENSIVE METABOLIC PANEL - Abnormal; Notable for the following:    Glucose, Bld 114 (*)    All other components  within normal limits  URINALYSIS, ROUTINE W REFLEX MICROSCOPIC - Abnormal; Notable for the following:    APPearance CLOUDY (*)    Leukocytes, UA TRACE (*)    All other components within normal limits  URINE MICROSCOPIC-ADD ON - Abnormal; Notable for the following:    Squamous Epithelial / LPF MANY (*)    Bacteria, UA MANY (*)    All other components within normal limits  LIPASE, BLOOD  POC URINE PREG, ED    Imaging Review Ct Abdomen Pelvis W Contrast  09/01/2014   CLINICAL DATA:  Mid abdominal pain radiating into the back for 2 months  EXAM: CT ABDOMEN AND PELVIS WITH CONTRAST  TECHNIQUE: Multidetector CT imaging of the abdomen and pelvis was performed using the standard protocol following bolus administration of intravenous contrast.  CONTRAST:  OMNIPAQUE IOHEXOL 300 MG/ML  SOLN  COMPARISON:  None.  FINDINGS: The lung bases are free of acute infiltrate or sizable effusion. The liver, gallbladder, spleen, adrenal glands and pancreas are all normal in their CT appearance. Stomach is distended with food stuffs.  The kidneys are well visualized bilaterally and demonstrate a normal enhancement pattern. No renal calculi or obstructive changes are seen.  The appendix is within normal limits. The bladder is well distended. No pelvic mass lesion or sidewall abnormality is noted. No acute bony abnormality is seen.  IMPRESSION: No acute abnormality is identified.   Electronically Signed   By: Alcide Clever M.D.   On: 09/01/2014 12:58     EKG Interpretation None      MDM   Final diagnoses:  Epigastric pain   29 yo F no significant PMHx presents with CC of abdominal pain X 1 month.  Pt was seen on 2/15 for same here in ED.  She had labs at that time including CBC, CMP, Lipase, UA, Urine pregnancy all of which were WNL.  Pt had pelvic exam with white cervical lesions.  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  She has planned f/u with PCP in 6 months, and was  scheduled for unknown [place] on Friday.  Pt states pain worsened over the last two days.    Labs today demonstrate CBC with mild normocytic anemia Hgb 11.4, with no leukocytosis.  CMP WNL, Lipase WNL, UA appears contaminated with many epithelials.  Urine pregnancy negative.  CT abdomen demonstrates no acute abnormality.  There appears to be some stool burden on my inspection of CT, so I question constipation as cause of pt's discomfort and fullness.    On reeval pt feels better.  Will d/c home in good condition.  May start on stool softener, high fiber diet.  F/u with PCP in 1 week, given [facility] follow [facility] information.  Return precautions given. Pt understands and agrees with plan.   Jon Gills  Discussed pt with my attending Dr. Hyacinth Meeker.   Jon Gills, MD 09/01/14 1555  Vida Roller, MD 09/01/14 5284  Vida Roller, MD 09/01/14 [phone]

## 2014-09-01 NOTE — ED Provider Notes (Signed)
The patient is a 29 year old female, she has no past surgical history though she is a G2 and P6 who is not pregnant. She has had 1 month of abdominal discomfort and distention, endorses having decreased bowel movements but increased amounts of flatulence. No nausea, epigastric and right upper quadrant abdominal discomfort, no lower abdominal discomfort, no dysuria, no hematuria, no rectal bleeding. On exam the patient has a distended tympanitic abdomen except for the right upper quadrant and right lower quadrant. There is no tenderness in the lower abdomen, she does have mild tenderness in the upper abdomen but no guarding. No peritoneal signs, normal heart and lung exam, no peripheral edema. Labs reviewed, prior visits reviewed, the patient will need labs and imaging to rule out other sources of abdominal distention, CT scan will be ordered, the patient is aware of the results of prior visits and the need for testing today and is in agreement. Anticipate discharge if CT scan unremarkable.  I saw and evaluated the patient, reviewed the resident's note and I agree with the findings and plan.   Final diagnoses:  Epigastric pain      Vida RollerBrian D Rodriguez Aguinaldo, MD 09/01/14 2134

## 2014-09-01 NOTE — Discharge Instructions (Signed)
Abdominal Pain, Women °Abdominal (stomach, pelvic, or belly) pain can be caused by many things. It is important to tell your doctor: °· The location of the pain. °· Does it come and go or is it present all the time? °· Are there things that start the pain (eating certain foods, exercise)? °· Are there other symptoms associated with the pain (fever, nausea, vomiting, diarrhea)? °All of this is helpful to know when trying to find the cause of the pain. °CAUSES  °· Stomach: virus or bacteria infection, or ulcer. °· Intestine: appendicitis (inflamed appendix), regional ileitis (Crohn's disease), ulcerative colitis (inflamed colon), irritable bowel syndrome, diverticulitis (inflamed diverticulum of the colon), or cancer of the stomach or intestine. °· Gallbladder disease or stones in the gallbladder. °· Kidney disease, kidney stones, or infection. °· Pancreas infection or cancer. °· Fibromyalgia (pain disorder). °· Diseases of the female organs: °¨ Uterus: fibroid (non-cancerous) tumors or infection. °¨ Fallopian tubes: infection or tubal pregnancy. °¨ Ovary: cysts or tumors. °¨ Pelvic adhesions (scar tissue). °¨ Endometriosis (uterus lining tissue growing in the pelvis and on the pelvic organs). °¨ Pelvic congestion syndrome (female organs filling up with blood just before the menstrual period). °¨ Pain with the menstrual period. °¨ Pain with ovulation (producing an egg). °¨ Pain with an IUD (intrauterine device, birth control) in the uterus. °¨ Cancer of the female organs. °· Functional pain (pain not caused by a disease, may improve without treatment). °· Psychological pain. °· Depression. °DIAGNOSIS  °Your doctor will decide the seriousness of your pain by doing an examination. °· Blood tests. °· X-rays. °· Ultrasound. °· CT scan (computed tomography, special type of X-ray). °· MRI (magnetic resonance imaging). °· Cultures, for infection. °· Barium enema (dye inserted in the large intestine, to better view it with  X-rays). °· Colonoscopy (looking in intestine with a lighted tube). °· Laparoscopy (minor surgery, looking in abdomen with a lighted tube). °· Major abdominal exploratory surgery (looking in abdomen with a large incision). °TREATMENT  °The treatment will depend on the cause of the pain.  °· Many cases can be observed and treated at home. °· Over-the-counter medicines recommended by your caregiver. °· Prescription medicine. °· Antibiotics, for infection. °· Birth control pills, for painful periods or for ovulation pain. °· Hormone treatment, for endometriosis. °· Nerve blocking injections. °· Physical therapy. °· Antidepressants. °· Counseling with a psychologist or psychiatrist. °· Minor or major surgery. °HOME CARE INSTRUCTIONS  °· Do not take laxatives, unless directed by your caregiver. °· Take over-the-counter pain medicine only if ordered by your caregiver. Do not take aspirin because it can cause an upset stomach or bleeding. °· Try a clear liquid diet (broth or water) as ordered by your caregiver. Slowly move to a bland diet, as tolerated, if the pain is related to the stomach or intestine. °· Have a thermometer and take your temperature several times a day, and record it. °· Bed rest and sleep, if it helps the pain. °· Avoid sexual intercourse, if it causes pain. °· Avoid stressful situations. °· Keep your follow-up appointments and tests, as your caregiver orders. °· If the pain does not go away with medicine or surgery, you may try: °¨ Acupuncture. °¨ Relaxation exercises (yoga, meditation). °¨ Group therapy. °¨ Counseling. °SEEK MEDICAL CARE IF:  °· You notice certain foods cause stomach pain. °· Your home care treatment is not helping your pain. °· You need stronger pain medicine. °· You want your IUD removed. °· You feel faint or   lightheaded. °· You develop nausea and vomiting. °· You develop a rash. °· You are having side effects or an allergy to your medicine. °SEEK IMMEDIATE MEDICAL CARE IF:  °· Your  pain does not go away or gets worse. °· You have a fever. °· Your pain is felt only in portions of the abdomen. The right side could possibly be appendicitis. The left lower portion of the abdomen could be colitis or diverticulitis. °· You are passing blood in your stools (bright red or black tarry stools, with or without vomiting). °· You have blood in your urine. °· You develop chills, with or without a fever. °· You pass out. °MAKE SURE YOU:  °· Understand these instructions. °· Will watch your condition. °· Will get help right away if you are not doing well or get worse. °Document Released: 04/23/2007 Document Revised: 11/10/2013 Document Reviewed: 05/13/2009 °[employer]® Patient Information ©2015 [employer]. This information is not intended to replace advice given to you by your health care provider. Make sure you discuss any questions you have with your health care provider. ° °

## 2014-09-01 NOTE — ED Notes (Signed)
Has been having abd pain and lower back for the past 2 days. Feels bloated. Last BM was two days ago and normal for her. Denies any N/V/D

## 2014-09-01 NOTE — ED Notes (Signed)
Pt has finished all of contrast,

## 2014-10-23 ENCOUNTER — Emergency Department (INDEPENDENT_AMBULATORY_CARE_PROVIDER_SITE_OTHER): Payer: 59

## 2014-10-23 ENCOUNTER — Encounter (HOSPITAL_COMMUNITY): Payer: Self-pay | Admitting: Emergency Medicine

## 2014-10-23 ENCOUNTER — Emergency Department (HOSPITAL_COMMUNITY)
Admission: EM | Admit: 2014-10-23 | Discharge: 2014-10-23 | Disposition: A | Discharge status: Home / Self Care | Payer: 59 | Source: Home / Self Care | Attending: Family Medicine | Admitting: Family Medicine

## 2014-10-23 DIAGNOSIS — R69 Illness, unspecified: Principal | ICD-10-CM

## 2014-10-23 DIAGNOSIS — J111 Influenza due to unidentified influenza virus with other respiratory manifestations: Secondary | ICD-10-CM

## 2014-10-23 MED ORDER — OSELTAMIVIR PHOSPHATE 75 MG PO CAPS: 75.0000 mg | ORAL_CAPSULE | Freq: Two times a day (BID) | ORAL | Status: DC

## 2014-10-23 MED ORDER — TRAMADOL HCL 50 MG PO TABS: 50.0000 mg | ORAL_TABLET | Freq: Every evening | ORAL | Status: DC | PRN

## 2014-10-23 NOTE — Discharge Instructions (Signed)
Thank you for coming in today. Take up to 2 Aleve twice daily. Use tramadol for cough and severe pain. Take tamiflu.  Call or go to the emergency room if you get worse, have trouble breathing, have chest pains, or palpitations.   Influenza Influenza ("the flu") is a viral infection of the respiratory tract. It occurs more often in winter months because people spend more time in close contact with one another. Influenza can make you feel very sick. Influenza easily spreads from person to person (contagious). CAUSES  Influenza is caused by a virus that infects the respiratory tract. You can catch the virus by breathing in droplets from an infected person's cough or sneeze. You can also catch the virus by touching something that was recently contaminated with the virus and then touching your mouth, nose, or eyes. RISKS AND COMPLICATIONS You may be at risk for a more severe case of influenza if you smoke cigarettes, have diabetes, have chronic heart disease (such as heart failure) or lung disease (such as asthma), or if you have a weakened immune system. Elderly people and pregnant women are also at risk for more serious infections. The most common problem of influenza is a lung infection (pneumonia). Sometimes, this problem can require emergency medical care and may be life threatening. SIGNS AND SYMPTOMS  Symptoms typically last 4 to 10 days and may include:  Fever.  Chills.  Headache, body aches, and muscle aches.  Sore throat.  Chest discomfort and cough.  Poor appetite.  Weakness or feeling tired.  Dizziness.  Nausea or vomiting. DIAGNOSIS  Diagnosis of influenza is often made based on your history and a physical exam. A nose or throat swab test can be done to confirm the diagnosis. TREATMENT  In mild cases, influenza goes away on its own. Treatment is directed at relieving symptoms. For more severe cases, your health care provider may prescribe antiviral medicines to shorten the  sickness. Antibiotic medicines are not effective because the infection is caused by a virus, not by bacteria. HOME CARE INSTRUCTIONS  Take medicines only as directed by your health care provider.  Use a cool mist humidifier to make breathing easier.  Get plenty of rest until your temperature returns to normal. This usually takes 3 to 4 days.  Drink enough fluid to keep your urine clear or pale yellow.  Cover yourmouth and nosewhen coughing or sneezing,and wash your handswellto prevent thevirusfrom spreading.  Stay homefromwork orschool untilthe fever is gonefor at least 141full day. PREVENTION  An annual influenza vaccination (flu shot) is the best way to avoid getting influenza. An annual flu shot is now routinely recommended for all adults in the [place] SEEK MEDICAL CARE IF:  You experiencechest pain, yourcough worsens,or you producemore mucus.  Youhave nausea,vomiting, ordiarrhea.  Your fever returns or gets worse. SEEK IMMEDIATE MEDICAL CARE IF:  You havetrouble breathing, you become short of breath,or your skin ornails becomebluish.  You have severe painor stiffnessin the neck.  You develop a sudden headache, or pain in the face or ear.  You have nausea or vomiting that you cannot control. MAKE SURE YOU:   Understand these instructions.  Will watch your condition.  Will get help right away if you are not doing well or get worse. Document Released: 06/23/2000 Document Revised: 11/10/2013 Document Reviewed: 09/25/2011 [facility] Patient Information 2015 [employer] [place]. This information is not intended to replace advice given to you by your health care provider. Make sure you discuss any questions you have with your  health care provider. ° °

## 2014-10-23 NOTE — ED Notes (Signed)
Pt has been suffering from a cough and nasal congestion for about 1 week.  Yesterday she developed body aches and chills.  She denies fever at home.

## 2014-10-23 NOTE — ED Provider Notes (Signed)
Tonya CollardJammie L David is a 29 y.o. female who presents to Urgent Care today for cough congestion body aches chills. Patient notes headache. She body aches are moderate to severe. She's had mild symptoms for week and worsened abruptly yesterday. She is concerned she may have influenza. . Patient has used DayQuil and ibuprofen.   Past Medical History  Diagnosis Date  . Preterm labor   . Anemia   . Urinary tract infection   . ZOXWRUEA(540.9Headache(784.0)    Past Surgical History  Procedure Laterality Date  . Therapeutic abortion     History  Substance Use Topics  . Smoking status: Former Smoker -- 0.25 packs/day for 4 years    Types: Cigarettes    Quit date: 11/21/2013  . Smokeless tobacco: Never Used  . Alcohol Use: No     Comment: on occasionsNONE SINCE PREG   ROS as above Medications: No current facility-administered medications for this encounter.   Current Outpatient Prescriptions  Medication Sig Dispense Refill  . medroxyPROGESTERone (DEPO-PROVERA) 150 MG/ML injection Inject 150 mg into the muscle every 3 (three) months.    Marland Kitchen. acetaminophen (TYLENOL) 325 MG tablet Take 650 mg by mouth every 6 (six) hours as needed for mild pain.    . Ibuprofen (MIDOL PO) Take 2 tablets by mouth daily as needed (pain).    Marland Kitchen. oseltamivir (TAMIFLU) 75 MG capsule Take 1 capsule (75 mg total) by mouth every 12 (twelve) hours. 10 capsule 0  . traMADol (ULTRAM) 50 MG tablet Take 1 tablet (50 mg total) by mouth at bedtime as needed (cough). 10 tablet 0  . [DISCONTINUED] Norethindrone Acetate-Ethinyl Estrad-FE (LOESTRIN 24 FE) 1-20 MG-MCG(24) tablet Take 1 tablet by mouth 1 day or 1 dose. Start in 3 weeks (Patient not taking: Reported on 08/24/2014) 1 Package 3   No Known Allergies   Exam:  BP 120/80 mmHg  Pulse 81  Temp(Src) 99.8 F (37.7 C) (Oral)  Resp 16  SpO2 100% Gen: Well NAD HEENT: EOMI,  MMM nasal discharge. Normal posterior pharynx and tympanic membranes Lungs: Normal work of breathing. CTABL Heart:  RRR no MRG Abd: NABS, Soft. Nondistended, Nontender Exts: Brisk capillary refill, warm and well perfused.   No results found for this or any previous visit (from the past 24 hour(s)). Dg Chest 2 View  10/23/2014   CLINICAL DATA:  Cough for 1 week, body aches, chills  EXAM: CHEST  2 VIEW  COMPARISON:  None.  FINDINGS: Cardiomediastinal silhouette is unremarkable. No acute infiltrate or pleural effusion. No pulmonary edema. Bony thorax is unremarkable.  IMPRESSION: No active cardiopulmonary disease.   Electronically Signed   By: Natasha MeadLiviu  Pop M.D.   On: 10/23/2014 14:14    Assessment and Plan: 29 y.o. female with influenza-like illness. Discussed options. Treat with Tamiflu NSAIDs and Ultram  Discussed warning signs or symptoms. Please see discharge instructions. Patient expresses understanding.     Rodolph BongEvan S Nashay Brickley, MD 10/23/14 [phone]

## 2015-04-04 ENCOUNTER — Encounter (HOSPITAL_COMMUNITY): Payer: Self-pay | Admitting: *Deleted

## 2015-04-04 ENCOUNTER — Emergency Department (INDEPENDENT_AMBULATORY_CARE_PROVIDER_SITE_OTHER)
Admission: EM | Admit: 2015-04-04 | Discharge: 2015-04-04 | Disposition: A | Discharge status: Home / Self Care | Payer: 59 | Source: Home / Self Care | Attending: Emergency Medicine | Admitting: Emergency Medicine

## 2015-04-04 DIAGNOSIS — N9489 Other specified conditions associated with female genital organs and menstrual cycle: Secondary | ICD-10-CM

## 2015-04-04 MED ORDER — DOXYCYCLINE HYCLATE 100 MG PO CAPS: 100.0000 mg | ORAL_CAPSULE | Freq: Two times a day (BID) | ORAL | Status: DC

## 2015-04-04 MED ORDER — FLUCONAZOLE 150 MG PO TABS
150.0000 mg | ORAL_TABLET | Freq: Once | ORAL | Status: DC
Start: 2015-04-04 — End: 2015-08-20

## 2015-04-04 NOTE — ED Provider Notes (Signed)
CSN: [id number]     Arrival date & time 04/04/15  1933 History   First MD Initiated Contact with Patient 04/04/15 1944     Chief Complaint  Patient presents with  . Groin Swelling   (Consider location/radiation/quality/duration/timing/severity/associated sxs/prior Treatment) HPI  She is a 29 year old woman here for evaluation of left labial swelling. She states this started this morning. She reports swelling and discomfort to the left labia as well as some mild itching. She denies any discharge. No abdominal pain. No new sexual partners. She did use a different soap yesterday.  No fevers or chills.  Past Medical History  Diagnosis Date  . Preterm labor   . Anemia   . Urinary tract infection   . WUJWJXBJ(478.2)    Past Surgical History  Procedure Laterality Date  . Therapeutic abortion     Family History  Problem Relation Age of Onset  . Alcohol abuse Neg Hx   . Arthritis Neg Hx   . Asthma Neg Hx   . Birth defects Neg Hx   . COPD Neg Hx   . Depression Neg Hx   . Diabetes Neg Hx   . Drug abuse Neg Hx   . Early death Neg Hx   . Hearing loss Neg Hx   . Heart disease Neg Hx   . Hypertension Neg Hx   . Hyperlipidemia Neg Hx   . Kidney disease Neg Hx   . Learning disabilities Neg Hx   . Mental illness Neg Hx   . Mental retardation Neg Hx   . Miscarriages / Stillbirths Neg Hx   . Stroke Neg Hx   . Vision loss Neg Hx   . Other Neg Hx   . Cancer Maternal Grandmother   . Cancer Maternal Grandfather    Social History  Substance Use Topics  . Smoking status: Current Every Day Smoker    Types: Cigarettes    Last Attempt to Quit: 11/21/2013  . Smokeless tobacco: Never Used  . Alcohol Use: Yes     Comment: occasionally   OB History    Gravida Para Term Preterm AB TAB SAB Ectopic Multiple Living   0 0 6     Review of Systems As in history of present illness Allergies  Review of patient's allergies indicates no known allergies.  Home Medications    Prior to Admission medications   Medication Sig Start Date End Date Taking? Authorizing Provider  medroxyPROGESTERone (DEPO-PROVERA) 150 MG/ML injection Inject 150 mg into the muscle every 3 (three) months.   Yes Historical Provider, MD  doxycycline (VIBRAMYCIN) 100 MG capsule Take 1 capsule (100 mg total) by mouth 2 (two) times daily. 04/04/15   Charm Rings, MD   Meds Ordered and Administered this Visit  Medications - No data to display  BP 139/89 mmHg  Pulse 90  Temp(Src) 98.4 F (36.9 C) (Oral)  Resp 22  SpO2 98% No data found.   Physical Exam  Constitutional: She is oriented to person, place, and time. She appears well-developed and well-nourished. No distress.  Cardiovascular: Normal rate.   Pulmonary/Chest: Effort normal.  Genitourinary:  Normal external genitalia. The left labia menorah is slightly swollen. It is mildly tender to palpation. There is some erythema. No focal abscess.  Neurological: She is alert and oriented to person, place, and time.    ED Course  Procedures (including critical care time)  Labs Review Labs Reviewed - No data to display  Imaging Review  No results found.    MDM   1. Labial swelling    Allergic reaction to new soap versus very early abscess. Will treat with warm compresses and doxycycline. If not improving in the next 2 days, she will go to [facility].    Charm Rings, MD 04/04/15 2015

## 2015-04-04 NOTE — ED Notes (Signed)
States woke up with vulvar swelling, irritation, and slight itching today.  Denies any vaginal discharge.  Has been applying wet rag.

## 2015-04-04 NOTE — Discharge Instructions (Signed)
You are either having an allergic reaction to the soap or you are developing an abscess/infection. Continue the warm compresses. Take doxycycline twice a day for 7 days. If things are not improving in the next 2 days, please go to [facility].

## 2015-07-13 ENCOUNTER — Emergency Department (HOSPITAL_COMMUNITY)
Admission: EM | Admit: 2015-07-13 | Discharge: 2015-07-13 | Disposition: A | Discharge status: Home / Self Care | Payer: 59 | Attending: Emergency Medicine | Admitting: Emergency Medicine

## 2015-07-13 ENCOUNTER — Encounter (HOSPITAL_COMMUNITY): Payer: Self-pay | Admitting: Emergency Medicine

## 2015-07-13 DIAGNOSIS — Z79899 Other long term (current) drug therapy: Secondary | ICD-10-CM | POA: Insufficient documentation

## 2015-07-13 DIAGNOSIS — F17210 Nicotine dependence, cigarettes, uncomplicated: Secondary | ICD-10-CM | POA: Insufficient documentation

## 2015-07-13 DIAGNOSIS — Z793 Long term (current) use of hormonal contraceptives: Secondary | ICD-10-CM | POA: Insufficient documentation

## 2015-07-13 DIAGNOSIS — Z792 Long term (current) use of antibiotics: Secondary | ICD-10-CM | POA: Insufficient documentation

## 2015-07-13 DIAGNOSIS — Z87440 Personal history of urinary (tract) infections: Secondary | ICD-10-CM | POA: Insufficient documentation

## 2015-07-13 DIAGNOSIS — Z862 Personal history of diseases of the blood and blood-forming organs and certain disorders involving the immune mechanism: Secondary | ICD-10-CM | POA: Insufficient documentation

## 2015-07-13 DIAGNOSIS — K0889 Other specified disorders of teeth and supporting structures: Secondary | ICD-10-CM | POA: Insufficient documentation

## 2015-07-13 MED ORDER — OXYCODONE-ACETAMINOPHEN 5-325 MG PO TABS
1.0000 | ORAL_TABLET | Freq: Once | ORAL | Status: AC
  Administered 2015-07-13: 1 via ORAL
  Filled 2015-07-13: qty 1

## 2015-07-13 MED ORDER — AMOXICILLIN-POT CLAVULANATE 875-125 MG PO TABS
1.0000 | ORAL_TABLET | Freq: Two times a day (BID) | ORAL | Status: DC
Start: 2015-07-13 — End: 2015-08-20

## 2015-07-13 MED ORDER — IBUPROFEN 400 MG PO TABS
800.0000 mg | ORAL_TABLET | Freq: Once | ORAL | Status: AC
  Administered 2015-07-13: 800 mg via ORAL
  Filled 2015-07-13: qty 2

## 2015-07-13 MED ORDER — OXYCODONE-ACETAMINOPHEN 5-325 MG PO TABS: 1.0000 | ORAL_TABLET | ORAL | Status: DC | PRN

## 2015-07-13 MED ORDER — IBUPROFEN 800 MG PO TABS: 800.0000 mg | ORAL_TABLET | Freq: Three times a day (TID) | ORAL | Status: DC

## 2015-07-13 NOTE — ED Provider Notes (Signed)
CSN: [id number]     Arrival date & time 07/13/15  1129 History  By signing my name below, I, Tonya David, attest that this documentation has been prepared under the direction and in the presence of Tonya Joy, PA-C. Electronically Signed: Tanda RockersMargaux David, ED Scribe. 07/13/2015. 12:15 PM.   Chief Complaint  Patient presents with  . Dental Pain   The history is provided by the patient. No language interpreter was used.     HPI Comments: Tonya David is a 30 y.o. female who presents to the Emergency Department complaining of gradual onset, constant, 9/10, throbbing and aching, left lower and right upper dental pain x a couple of days. Pt reports similar pain a couple of months ago. She was seen by a dentist at that time and was scheduled to have her wisdom teeth removed but could not make the appointment due to work scheduling issues. Pt reports that she does not have a dentist at the moment due to lack of insurance. Denies fever, chills, difficulty breathing, swallowing, or talking, or any other associated symptoms.   Past Medical History  Diagnosis Date  . Preterm labor   . Anemia   . Urinary tract infection   . WUJWJXBJ(478.2Headache(784.0)    Past Surgical History  Procedure Laterality Date  . Therapeutic abortion     Family History  Problem Relation Age of Onset  . Alcohol abuse Neg Hx   . Arthritis Neg Hx   . Asthma Neg Hx   . Birth defects Neg Hx   . COPD Neg Hx   . Depression Neg Hx   . Diabetes Neg Hx   . Drug abuse Neg Hx   . Early death Neg Hx   . Hearing loss Neg Hx   . Heart disease Neg Hx   . Hypertension Neg Hx   . Hyperlipidemia Neg Hx   . Kidney disease Neg Hx   . Learning disabilities Neg Hx   . Mental illness Neg Hx   . Mental retardation Neg Hx   . Miscarriages / Stillbirths Neg Hx   . Stroke Neg Hx   . Vision loss Neg Hx   . Other Neg Hx   . Cancer Maternal Grandmother   . Cancer Maternal Grandfather    Social History  Substance Use Topics  . Smoking  status: Current Every Day Smoker -- 0.50 packs/day    Types: Cigarettes    Last Attempt to Quit: 11/21/2013  . Smokeless tobacco: Never Used  . Alcohol Use: Yes     Comment: occasionally   OB History    Gravida Para Term Preterm AB TAB SAB Ectopic Multiple Living   9 6 2 4 3 2 1  0 0 6     Review of Systems  Constitutional: Negative for fever and chills.  HENT: Positive for dental problem. Negative for trouble swallowing.   Respiratory: Negative for shortness of breath.   All other systems reviewed and are negative.  Allergies  Review of patient's allergies indicates no known allergies.  Home Medications   Prior to Admission medications   Medication Sig Start Date End Date Taking? Authorizing Provider  amoxicillin-clavulanate (AUGMENTIN) 875-125 MG tablet Take 1 tablet by mouth every 12 (twelve) hours. 07/13/15   Tonya C Joy, PA-C  doxycycline (VIBRAMYCIN) 100 MG capsule Take 1 capsule (100 mg total) by mouth 2 (two) times daily. 04/04/15   Tonya RingsErin J Honig, MD  fluconazole (DIFLUCAN) 150 MG tablet Take 1 tablet (150 mg total) by mouth  once. Repeat in 3 days if symptoms persist. 04/04/15   Tonya Rings, MD  ibuprofen (ADVIL,MOTRIN) 800 MG tablet Take 1 tablet (800 mg total) by mouth 3 (three) times daily. 07/13/15   Tonya C Joy, PA-C  medroxyPROGESTERone (DEPO-PROVERA) 150 MG/ML injection Inject 150 mg into the muscle every 3 (three) months.    Historical Provider, MD  oxyCODONE-acetaminophen (PERCOCET/ROXICET) 5-325 MG tablet Take 1-2 tablets by mouth every 4 (four) hours as needed for severe pain. 07/13/15   Tonya Pancoast, PA-C   Triage Vitals:  BP 122/89 mmHg  Pulse 84  Temp(Src) 98.2 F (36.8 C) (Oral)  Resp 18  SpO2 99%  LMP 07/06/2015   Physical Exam  Constitutional: She is oriented to person, place, and time. She appears well-developed and well-nourished. No distress.  HENT:  Head: Normocephalic and atraumatic.  Bottom left rear most molar partially covered with gingival  tissue Top right rear most molar emerging from the gingival surface. Seems to be coming in crooked.  No discernable abscess or area of fluctuance No changes in voice, ability to swallow, or breath  Eyes: Conjunctivae and EOM are normal.  Neck: Neck supple. No tracheal deviation present.  Cardiovascular: Normal rate.   Pulmonary/Chest: Effort normal. No respiratory distress.  Musculoskeletal: Normal range of motion.  Neurological: She is alert and oriented to person, place, and time.  Skin: Skin is warm and dry.  Psychiatric: She has a normal mood and affect. Her behavior is normal.  Nursing note and vitals reviewed.   ED Course  Procedures (including critical care time)  DIAGNOSTIC STUDIES: Oxygen Saturation is 99% on RA, normal by my interpretation.    COORDINATION OF CARE: 12:13 PM-Discussed treatment plan which includes referral to dentist with pt at bedside and pt agreed to plan.     MDM   Final diagnoses:  Pain, dental   Tonya David presents with dental pain for the last 2 days.  This patient's presentation is consistent with a wisdom tooth eruption, although this seems like it is late in a patient of this age group. Patient has no discernible abscess or area of swelling or infection. Antibiotics started for premedication in anticipation for extraction. Patient was advised that she would need to follow up with a dentist within the next couple days. The importance of this was stressed to the patient. Patient voiced understanding of these instructions, agrees to the plan, and is comfortable with discharge.  I personally performed the services described in this documentation, which was scribed in my presence. The recorded information has been reviewed and is accurate.      Tonya Pancoast, PA-C 07/13/15 1628  Tonya Baptist, MD 07/17/15 [phone]

## 2015-07-13 NOTE — ED Notes (Signed)
Patient states wisdom teeth coming in L lower and R upper.  Patient states she was supposed to have the teeth extracted a couple of months ago, but work schedule didn't permit.  Patient states increased pain.   Patient states now without dental insurance.

## 2015-07-13 NOTE — ED Notes (Signed)
Declined W/C at D/C and was escorted to lobby by RN. 

## 2015-07-13 NOTE — Discharge Instructions (Signed)
You have been seen today for dental pain. You must follow-up with a dentist as soon as possible on this matter. Follow up with PCP as needed. Return to ED should symptoms worsen. Continue taking the antibiotics until they are done or the dentist tells you to stop. When you make your appointment with the dentist make sure they know that you have started treatment with antibiotics.   Emergency Department Resource Guide 1) Find a Doctor and Pay Out of Pocket Although you won't have to find out who is covered by your insurance plan, it is a good idea to ask around and get recommendations. You will then need to call the office and see if the doctor you have chosen will accept you as a new patient and what types of options they offer for patients who are self-pay. Some doctors offer discounts or will set up payment plans for their patients who do not have insurance, but you will need to ask so you aren't surprised when you get to your appointment.  2) Contact Your Local Health Department Not all health departments have doctors that can see patients for sick visits, but many do, so it is worth a call to see if yours does. If you don't know where your local health department is, you can check in your phone book. The CDC also has a tool to help you locate your state's health department, and many state websites also have listings of all of their local health departments.  3) Find a Walk-in Clinic If your illness is not likely to be very severe or complicated, you may want to try a walk in clinic. These are popping up all over the country in pharmacies, drugstores, and shopping centers. They're usually staffed by nurse practitioners or physician assistants that have been trained to treat common illnesses and complaints. They're usually fairly quick and inexpensive. However, if you have serious medical issues or chronic medical problems, these are probably not your best option.  No Primary Care Doctor: - Call  Health Connect at  [phone] - they can help you locate a primary care doctor that  accepts your insurance, provides certain services, etc. - Physician Referral Service- [phone]  Chronic Pain Problems: Organization         Address  Phone   Notes  [facility]  [phone] Patients need to be referred by their primary care doctor.   Medication Assistance: Organization         Address  Phone   Notes  [facility] Medication [facility] [address] [place] [place], [place] [address] [phone] --Must be a resident of [facility] -- Must have NO insurance coverage whatsoever (no Medicaid/ Medicare, etc.) -- The pt. MUST have a primary care doctor that directs their care regularly and follows them in the community   [facility]ist  [phone]   [employer]  [phone]    Agencies that provide inexpensive medical care: Organization         Address  Phone   Notes  [facility]  [phone]   [facility]    [phone]   [facility] [address] [place], [place] [address] [phone]   [facility] [address] [place] [place] [phone]   Planned Parenthood    [phone]   [facility]    [phone]   [facility]  [phone]  [address], [place] Phone:  [phone], Fax:  [phone] Hours of Operation:  9 am - 6 pm, M-F.  Also accepts Medicaid/Medicare and self-pay.  [facility] for [place], [address], [place] Phone: [phone], Fax: [phone]. Hours of Operation:  8:30 am - 5:30 pm, M-F.  Also accepts Medicaid and self-pay.  [facility] [place] [address], [place] Phone: [phone]   [place], [place], [place] [phone], Ext. [phone] Mondays & Thursdays: 7-9 AM.  First 15 patients are seen on a first come, first serve  basis.    [place] Providers:  Organization         Address  Phone   Notes  [facility] [address], [place] [phone] Also accepts self-pay patients.  [facility] [address], [place]  [phone]   [place], [address], [place] [phone]   [facility] [address] [place] [phone]   Lucianne Lei [address], [place]   [phone] Only accepts [place] Access [place] patients after they have their name applied to their card.   Self-Pay (no insurance) in [facility]:  Organization         Address  Phone   Notes  Sickle Cell Patients, [facility] [place] [phone]   [facility] [place] [phone]   [facility] [place]  [place], [place], [place] [phone]   [facility]/Dr. Osei-Bonsu  [address], [place] or [address], [place] [phone] Phone number for both [place] and [place] locations is the same.  [facility]gent Medical and [facility] [address], [place] [phone]   [facility] [address] [place] or [address] [phone]   [facility] [address] [place] [phone], phone; [phone], fax Sees patients 1st and 3rd Saturday of every month.  Must not qualify for public or private insurance (i.e. Medicaid, Medicare, [place] Health Choice, Veterans' Benefits)  Household income should be no more than 200% of the poverty level The clinic cannot treat you if you are pregnant or think you are pregnant  Sexually transmitted diseases are not treated at the clinic.    Dental Care: Organization         Address  Phone  Notes  [facility] Department of [facility] [place] [facility] [place] [phone] Accepts children up to age 31 who are enrolled in [place] or [place]; pregnant women with a Medicaid card; and children who have applied for Medicaid or [place] Health Choice, but were declined, whose parents can pay a reduced fee at time of service.  [facility] Department of [facility]  [address], [place] [phone] Accepts children up to age 75 who are enrolled in [place] or [place]; pregnant women with a Medicaid card; and children who have applied for Medicaid or [place] Health Choice, but were declined, whose parents can pay a reduced fee at time of service.  [facility] Adult Dental Access PROGRAM  [place], [place] [phone] Patients  are seen by appointment only. Walk-ins are not accepted. [place] will see patients 23 years of age and older. Monday - Tuesday (8am-5pm) Most Wednesdays (8:30-5pm) $30 per visit, cash only  [facility] Adult Dental Access PROGRAM  [address], [facility] [phone] Patients are seen by appointment only. Walk-ins are not accepted. [place] will see patients 32 years of age and older. One Wednesday Evening (Monthly: Volunteer Based).  $30 per visit, cash only  [place]  [phone] for adults; Children under age 44, call Graduate Pediatric Dentistry at [phone]. Children aged 91-14, please call [phone] to request a pediatric application.  Dental services are provided in all areas of dental care including fillings, crowns and bridges, complete and partial dentures, implants, gum treatment, root canals, and extractions. Preventive care is also provided. Treatment is provided to both adults and children. Patients are selected via a lottery and there is often a waiting list.   [facility] [address], [place]  [phone]  [email or web address]   [facility] [address] [place], [place] [phone], Ext. [phone] Second and Fourth Thursday of each month, opens at 6:30 AM; Clinic ends at 9 AM.  Patients are seen on a first-come first-served basis, and a limited number are seen during each clinic.   [facility]  [address] Hillard Danker [place], [place] [phone]   Eligibility Requirements You must have lived in [place], [place], or [place] counties for at least the last three months.   You cannot be eligible for state or federal sponsored [employer], including [employer], [place], or [employer].   You generally cannot be eligible for healthcare insurance through your employer.    How to apply: Eligibility screenings are held every Tuesday and Wednesday afternoon from 1:00 pm until 4:00 pm. You do not need an appointment for the interview!  [facility] [address], [place], [place]   [place]  [place] Department  [place]  [phone]    Behavioral Health Resources in the Community: Intensive Outpatient Programs Organization         Address  Phone  Notes  [place]. [address] [place] [address], [place], [place] [phone]   [facility] [address], [place], [place]   ADS: [facility] [address], [place], [place]   [place] [address],  [place], [place] or [phone]   Substance Abuse Resources Organization         Address  Phone  Notes  Al[facility] Drug Services  [phone]   [place]  [phone]   [facility] [place]   Chinita Pester  [phone]   Residential & Outpatient Substance Abuse Program  [phone]   Psychological Services Organization          Address  Phone  Notes  [facility]  [place]  [phone]   [place] [address] [place] or [phone]    Mobile Crisis Teams Organization         Address  Phone  Notes  [facility], Mobile Crisis Care Unit  [phone]   [facility]  [address] [place], [place]   [facility] [address] [place] 18  [place] [phone]    Self-Help/Support Groups Organization         Address  Phone             Notes  [facility] [place] - variety of support groups  [phone] Call for more information  Narcotics Anonymous (NA), [facility] [address], [place]  2 meetings at this location   Statisticianesidential Treatment Programs Organization         Address  Phone  Notes  [facility] [address],    [place]  [phone]   [facility]  [address], [place] [phone], [place], [place] [phone]   [facility] [address], [place] [phone] Admissions: 8am-3pm M-F  [facility] [address]    [place], [place] [phone]   [facility] [address], [place], [place] [phone]   [facility] [address]  [place], [place] [phone]   [facility] - Intensive Outpatient [address], [place], [place] [phone]   [facility] ([facility] [address]  [place], [place] [phone] or [phone]   [facility] (RTS) [address] [place], [place] [phone] Accepts Medicaid  [facility] [address]  [place] [phone] Substance Abuse/Addiction Treatment   [facility] Behavioral Health Resources Organization         Address  Phone  Notes  [facility]  [phone]   Angie FavaJulie Brannon, PhD [address], [address] [place], [place]   [phone] or [phone]   [facility] [place]   [address] [place], [place] [phone]) [phone]   [facility] [address], [place], [place] [phone] Insurance/Medicaid/sponsorship through [facility]  [facility] [address]                                    [place], [place] [phone] Therapy/tele-psych/case  [facility] [address].   [place], [place] [phone](336) [phone]    Dr. Lolly MustacheArfeen  [phone]   [facility]  [facility] 1) [address], [place] 2) [address], [place] 3)  [address], [place] [phone]  [phone]   [facility] Child Abuse Hotline [phone](336) [phone] or [phone]) [phone] (After Hours)

## 2015-07-14 ENCOUNTER — Emergency Department (HOSPITAL_COMMUNITY)
Admission: EM | Admit: 2015-07-14 | Discharge: 2015-07-14 | Disposition: A | Discharge status: Home / Self Care | Payer: 59 | Attending: Emergency Medicine | Admitting: Emergency Medicine

## 2015-07-14 ENCOUNTER — Encounter (HOSPITAL_COMMUNITY): Payer: Self-pay | Admitting: Emergency Medicine

## 2015-07-14 DIAGNOSIS — Z8751 Personal history of pre-term labor: Secondary | ICD-10-CM | POA: Insufficient documentation

## 2015-07-14 DIAGNOSIS — K0889 Other specified disorders of teeth and supporting structures: Secondary | ICD-10-CM | POA: Insufficient documentation

## 2015-07-14 DIAGNOSIS — Z862 Personal history of diseases of the blood and blood-forming organs and certain disorders involving the immune mechanism: Secondary | ICD-10-CM | POA: Insufficient documentation

## 2015-07-14 DIAGNOSIS — Z87440 Personal history of urinary (tract) infections: Secondary | ICD-10-CM | POA: Insufficient documentation

## 2015-07-14 DIAGNOSIS — F17210 Nicotine dependence, cigarettes, uncomplicated: Secondary | ICD-10-CM | POA: Insufficient documentation

## 2015-07-14 MED ORDER — IBUPROFEN 800 MG PO TABS: 800.0000 mg | ORAL_TABLET | Freq: Three times a day (TID) | ORAL | Status: DC

## 2015-07-14 MED ORDER — BUPIVACAINE-EPINEPHRINE (PF) 0.5% -1:200000 IJ SOLN
1.8000 mL | Freq: Once | INTRAMUSCULAR | Status: AC
  Administered 2015-07-14: 1.8 mL
  Filled 2015-07-14: qty 1.8

## 2015-07-14 MED ORDER — PENICILLIN V POTASSIUM 500 MG PO TABS: 500.0000 mg | ORAL_TABLET | Freq: Four times a day (QID) | ORAL | Status: AC

## 2015-07-14 NOTE — ED Provider Notes (Signed)
CSN: [id number]     Arrival date & time 07/14/15  1358 History  By signing my name below, I, Tonya David, attest that this documentation has been prepared under the direction and in the presence of  [employer], PA-C. Electronically Signed: Doreatha David, ED Scribe. 07/14/2015. 2:49 PM.    Chief Complaint  Patient presents with  . Dental Pain    l/lower and r/upper mouth pain   The history is provided by the patient. No language interpreter was used.    HPI Comments: Tonya David is a 30 y.o. female otherwise healthy who presents to the Emergency Department complaining of moderate, 10/10 left lower and right upper dental pain onset 3 days ago. She states she has tried 800 mg ibuprofen with no relief. Pt was evaluated in the ED for the same pain yesterday, which was diagnosed with wisdom tooth eruption pain. Pt reports she was seen by a dentist for the pain a few months ago and notes she was unable to schedule extraction at that time d/t scheduling conflicts. She is no longer followed by a dentist due to a lack of insurance beginning 07/11/15. NKDA. She denies sore throat, fever, SOB, chills.   Past Medical History  Diagnosis Date  . Preterm labor   . Anemia   . Urinary tract infection   . [id number](478.2)    Past Surgical History  Procedure Laterality Date  . Therapeutic abortion     Family History  Problem Relation Age of Onset  . Alcohol abuse Neg Hx   . Arthritis Neg Hx   . Asthma Neg Hx   . Birth defects Neg Hx   . COPD Neg Hx   . Depression Neg Hx   . Diabetes Neg Hx   . Drug abuse Neg Hx   . Early death Neg Hx   . Hearing loss Neg Hx   . Heart disease Neg Hx   . Hypertension Neg Hx   . Hyperlipidemia Neg Hx   . Kidney disease Neg Hx   . Learning disabilities Neg Hx   . Mental illness Neg Hx   . Mental retardation Neg Hx   . Miscarriages / Stillbirths Neg Hx   . Stroke Neg Hx   . Vision loss Neg Hx   . Other Neg Hx   . Cancer Maternal Grandmother   . Cancer  Maternal Grandfather    Social History  Substance Use Topics  . Smoking status: Current Every Day Smoker -- 0.50 packs/day    Types: Cigarettes    Last Attempt to Quit: 11/21/2013  . Smokeless tobacco: Never Used  . Alcohol Use: Yes     Comment: occasionally   OB History    Gravida Para Term Preterm AB TAB SAB Ectopic Multiple Living   9 6 2 4 3 2 1  0 0 6     Review of Systems A 10 point review of systems was completed and was negative except for pertinent positives and negatives as mentioned in the history of present illness.   Allergies  Review of patient's allergies indicates no known allergies.  Home Medications   Prior to Admission medications   Medication Sig Start Date End Date Taking? Authorizing Provider  amoxicillin-clavulanate (AUGMENTIN) 875-125 MG tablet Take 1 tablet by mouth every 12 (twelve) hours. Patient not taking: Reported on 07/14/2015 07/13/15   Shawn C Joy, PA-C  doxycycline (VIBRAMYCIN) 100 MG capsule Take 1 capsule (100 mg total) by mouth 2 (two) times daily. Patient not taking:  Reported on 07/14/2015 04/04/15   Charm RingsErin J Honig, MD  fluconazole (DIFLUCAN) 150 MG tablet Take 1 tablet (150 mg total) by mouth once. Repeat in 3 days if symptoms persist. Patient not taking: Reported on 07/14/2015 04/04/15   Charm RingsErin J Honig, MD  ibuprofen (ADVIL,MOTRIN) 800 MG tablet Take 1 tablet (800 mg total) by mouth 3 (three) times daily. 07/14/15   [name], PA-C  oxyCODONE-acetaminophen (PERCOCET/ROXICET) 5-325 MG tablet Take 1-2 tablets by mouth every 4 (four) hours as needed for severe pain. Patient not taking: Reported on 07/14/2015 07/13/15   Shawn C Joy, PA-C  penicillin v potassium (VEETID) 500 MG tablet Take 1 tablet (500 mg total) by mouth 4 (four) times daily. 07/14/15 07/21/15  Joycie PeekBenjamin Jehan Ranganathan, PA-C   BP 142/92 mmHg  Pulse 84  Temp(Src) 98.1 F (36.7 C) (Oral)  Resp 14  SpO2 99%  LMP 07/06/2015 Physical Exam  Constitutional: She is oriented to person, place, and time.  She appears well-developed and well-nourished.  Awake, alert, nontoxic appearance.    HENT:  Head: Normocephalic and atraumatic.  Area of pain at left mandibular molar. Appears to have active caries. No gingival swelling, areas of fluctuance or erythema. No evidence of abscess. No trismus or glossal elevation. Patent airway and tolerating secretions well.  Eyes: Conjunctivae and EOM are normal. Pupils are equal, round, and reactive to light.  Neck: Normal range of motion. Neck supple.  Cardiovascular: Normal rate, regular rhythm and normal heart sounds.  Exam reveals no gallop and no friction rub.   No murmur heard. Heart sounds normal. RRR.    Pulmonary/Chest: Effort normal. No respiratory distress. She has no wheezes. She has no rales.  Lungs CTA bilaterally.   Abdominal: She exhibits no distension.  Musculoskeletal: Normal range of motion.  Neurological: She is alert and oriented to person, place, and time.  Skin: Skin is warm and dry.  Psychiatric: She has a normal mood and affect. Her behavior is normal.  Nursing note and vitals reviewed.   ED Course  Procedures (including critical care time) DIAGNOSTIC STUDIES: Oxygen Saturation is 99% on RA, normal by my interpretation.    COORDINATION OF CARE: 2:37 PM Discussed treatment plan with pt at bedside which includes dental block and pt agreed to plan.   3:15 PM Dental Block  Consent: Verbal consent obtained. Risks and benefits: risks, benefits and alternatives were discussed Patient identity confirmed: provided demographic data  Time out performed prior to procedure  Numbed gingiva surrounding affected molars with bupivacaine.  Administered 1.8 mL of [marcaine w/ EPI].  Pt tolerated procedure well without complication.   MDM   Final diagnoses:  Pain, dental   Filed Vitals:   07/14/15 1401  BP: 142/92  Pulse: 84  Temp: 98.1 F (36.7 C)  Resp: 14    Meds given in ED:  Medications  bupivacaine-epinephrine (MARCAINE  W/ EPI) 0.5% -1:200000 injection 1.8 mL (1.8 mLs Infiltration Given 07/14/15 1453)    New Prescriptions   IBUPROFEN (ADVIL,MOTRIN) 800 MG TABLET    Take 1 tablet (800 mg total) by mouth 3 (three) times daily.   PENICILLIN V POTASSIUM (VEETID) 500 MG TABLET    Take 1 tablet (500 mg total) by mouth 4 (four) times daily.     Here for evaluation of dental pain. On exam, there is no evidence of a drainable abscess. No trismus, glossal elevation, unilateral tonsillar swelling. No evidence of retropharyngeal or peritonsillar abscess or Ludwig angina. Patient received a dental block in the ED  and experiences relief. Discharged with outpatient dental resources. Also given prescription for anti-inflammatories and antibiotics. Overall, appears well, nontoxic and appropriate for discharge.   I personally performed the services described in this documentation, which was scribed in my presence. The recorded information has been reviewed and is accurate.   Joycie Peek, PA-C 07/14/15 1518  Doug Sou, MD 07/14/15 1630

## 2015-07-14 NOTE — Discharge Instructions (Signed)
Please take your medications as prescribed. Follow-up with dentistry for definitive care.  [facility]  [employer]  Community Service Learning [facility]  [address]  [place], [place] [address]  Phone [phone]  [facility] [employer] [facility] in [place], [place], exemplifies the [employer] vision to improve the health and quality of life of all [place] by Public house managercreating leaders with a passion to care for the underserved and by leading the nation in community-based, service learning oral health education. We are committed to offering comprehensive general dental services for adults, children and special needs patients in a safe, caring and professional setting.  Appointments: Our clinic is open Monday through Friday 8:00 a.m. until 5:00 p.m. The amount of time scheduled for an appointment depends on the patients specific needs. We ask that you keep your appointed time for care or provide 24-hour notice of all appointment changes. Parents or legal guardians must accompany minor children.  Payment for Services: Medicaid and other insurance plans are welcome. Payment for services is due when services are rendered and may be made by cash or credit card. If you have dental insurance, we will assist you with your claim submission.   Emergencies: Emergency services will be provided Monday through Friday on a walk-in basis. Please arrive early for emergency services. After hours emergency services will be provided for patients of record as required.  Services:  Comprehensive General Dentistry  Childrens Dentistry  Oral Surgery - Extractions  Root Canals  Sealants and Tooth Colored Fillings  Crowns and Bridges  Dentures and Partial Dentures  Implant Services  Periodontal Services and Agricultural engineerCleanings  Cosmetic Tooth Whitening  Digital Radiography  3-D/Cone Beam  Imaging   Emergency Department Resource Guide 1) Find a Doctor and Pay Out of Pocket Although you won't have to find out who is covered by your insurance plan, it is a good idea to ask around and get recommendations. You will then need to call the office and see if the doctor you have chosen will accept you as a new patient and what types of options they offer for patients who are self-pay. Some doctors offer discounts or will set up payment plans for their patients who do not have insurance, but you will need to ask so you aren't surprised when you get to your appointment.  2) Contact Your Local Health Department Not all health departments have doctors that can see patients for sick visits, but many do, so it is worth a call to see if yours does. If you don't know where your local health department is, you can check in your phone book. The CDC also has a tool to help you locate your state's health department, and many state websites also have listings of all of their local health departments.  3) Find a Walk-in Clinic If your illness is not likely to be very severe or complicated, you may want to try a walk in clinic. These are popping up all over the country in pharmacies, drugstores, and shopping centers. They're usually staffed by nurse practitioners or physician assistants that have been trained to treat common illnesses and complaints. They're usually fairly quick and inexpensive. However, if you have serious medical issues or chronic medical problems, these are probably not your best option.  No Primary Care Doctor: - Call Health Connect at  [phone] - they can help you locate a primary care doctor that  accepts your insurance,  provides certain services, etc. - Physician Referral Service- [phone]  Chronic Pain Problems: Organization         Address  Phone   Notes  [facility]  [phone] Patients need to be referred by their primary care doctor.   Medication  Assistance: Organization         Address  Phone   Notes  [facility] Medication [facility] [address] [place] [place], [place] [address] [phone] --Must be a resident of [facility] -- Must have NO insurance coverage whatsoever (no Medicaid/ Medicare, etc.) -- The pt. MUST have a primary care doctor that directs their care regularly and follows them in the community   [facility]ist  [phone]   [employer]  [phone]    Agencies that provide inexpensive medical care: Organization         Address  Phone   Notes  [facility]  [phone]   [facility]    [phone]   [facility] [address] [place], [place] [address] [phone]   [facility] [address] [place], [place] [phone]   Planned Parenthood    [phone]   [facility]    [phone]   [facility]  [address], [place] Phone:  [phone], Fax:  [phone] Hours of Operation:  9 am - 6 pm, M-F.  Also accepts Medicaid/Medicare and self-pay.  [facility]  [address], [place] Phone: [phone], Fax: [phone]. Hours of Operation:  8:30 am - 5:30 pm, M-F.  Also accepts Medicaid and self-pay.  [facility] [place] [address] [place] Phone: [phone]   [facility] [address] Natasha Bence [place], [place] [phone], Ext. [phone] Mondays & Thursdays: 7-9 AM.  First 15 patients are seen on a first come, first serve basis.    Medicaid-accepting [facility] Providers:  Organization         Address  Phone   Notes  [facility] [address], [place] [phone] Also accepts self-pay patients.  [facility] [address] Laurell Josephs [place], [place]  [phone]   [facility] [address], [place]  [phone]   [facility] [address] [place] [phone]   Renaye Rakers [address], [place]   [phone] Only accepts [place] Access [place] patients after they have their name applied to their card.   Self-Pay (no insurance) in [facility]:  Organization         Address  Phone   Notes  Sickle Cell Patients, [facility] [address] [place], [place] [phone]   [facility] Urgent Care [address] [place], [place] [phone]   [facility] [place]  [address], [place] [phone]   [facility]/Dr. Osei-Bonsu  [address], [place] or [address], [place] [phone] Phone number for both [place] and [place] locations is the same.  Urgent Medical and [facility] [address] [facility] [phone]   [facility] [address] [place] or [address] [phone]   [facility] [address], [place] [phone], phone; [phone], fax Sees patients 1st and 3rd Saturday of every month.  Must not qualify for public or private insurance (i.e. Medicaid, Medicare, [place] Health Choice, Veterans' Benefits)  Household income should be no more than 200% of the poverty level The clinic cannot treat you if you are pregnant or think you are pregnant  Sexually transmitted diseases are not treated at the clinic.    Dental Care: Organization         Address  Phone  Notes  [facility] Department of [facility] [address], [place] [phone] Accepts children up to age 55 who are enrolled in [place] or [place] Health Choice; pregnant women with a Medicaid card; and children who have applied for Medicaid or [place] Health Choice, but were declined, whose parents can pay a reduced fee at time of service.  [facility]  Department of [facility]  [address], [place] [phone] Accepts children up to age 28 who are enrolled in [place] or  Health Choice; pregnant women with a Medicaid card; and children who have applied for Medicaid or  Health Choice, but were declined, whose parents can pay a reduced fee at time of service.  [facility]  [address] [place], [place] [phone] Patients are seen by appointment only. Walk-ins are not accepted. [facility] will see patients 30 years of age and older. Monday - Tuesday (8am-5pm) Most Wednesdays (8:30-5pm) $30 per visit, cash only  [facility] Adult [facility]  [address], [facility] [phone] Patients are seen by appointment only. Walk-ins are not accepted. [facility] will see patients 55 years of age and older. One Wednesday Evening (Monthly: Volunteer Based).  $30 per visit, cash only  [employer]  [phone] for adults; Children under age 52, call Graduate Pediatric Dentistry at [phone]. Children aged 65-14, please call [phone] to request a pediatric application.  Dental services are provided in all areas of dental care including fillings, crowns and bridges, complete and partial dentures, implants, gum treatment, root canals, and extractions. Preventive care is also provided. Treatment is provided to both adults and children. Patients are selected via a lottery and there is often a waiting list.   [facility] [address] [place]  [phone] [email or web address]   [facility] [address] [place], [place] [phone], Ext. [phone] Second and Fourth Thursday of each month, opens at 6:30 AM; Clinic ends at 9 AM.  Patients are seen on a first-come first-served basis, and a limited number are seen during each clinic.   [facility]  [address] Ether Griffins [place], [place] [phone]    Eligibility Requirements You must have lived in [place], [place], or [place] counties for at least the last three months.   You cannot be eligible for state or federal sponsored [place], including [employer], [place], or [employer].   You generally cannot be eligible for healthcare insurance through your employer.    How to apply: Eligibility screenings are held every Tuesday and Wednesday afternoon from 1:00 pm until 4:00 pm. You do not need an appointment for the interview!  [facility] [address], [place], [place] [phone]   [facility] Health Department  [phone]   [facility] Health Department  [phone]   [facility] Health Department  [phone]  Behavioral Health Resources in the Community: Intensive Outpatient Programs Organization         Address  Phone  Notes  [facility] Services [address] [place], [place] [phone]   [facility] [address] [place], [place] [phone]   [facility] [address] [place], [place]  [phone]   [facility] Mental Health [address]  [place], [place] [phone] or [phone]   Substance Abuse Resources Organization         Address  Phone  Notes  Al[facility] Drug Services  [phone]   [facility]  [phone]   [facility]  [phone]   Floydene Flock  [phone]   Residential & Outpatient Substance Abuse Program  [phone]   Psychological Services Organization         Address  Phone  Notes  [facility] Behavioral Health  [phone]   [facility]  [phone]   [facility] Mental Health [address] [place] [phone] or [phone]    Mobile Crisis Teams Organization         Address  Phone  Notes  [facility], Mobile Crisis Care Unit  [phone]   [facility]  [address] [place] [address]  [place], [place] [phone]   Doristine Locks [address] [place] [phone]    Self-Help/Support Groups Organization         Address  Phone             Notes  [facility] [place] - variety of support groups  336- I7437963 Call for more information  Narcotics Anonymous (NA), [facility] [address], [employer] [place]  2 meetings at this location   Statistician         Address  Phone  Notes  [facility] [address],    [place]  [phone]   [facility]  [address], [place] [address], [place], [place] [phone]   [facility] Treatment Facility [address] [place], [place] [phone] Admissions: 8am-3pm M-F  [facility] [address],    [place], [place] [phone]   [facility] [address] [place], [place], [place] [phone]   [facility] [address]  [place], [place] [phone]   [facility] - Intensive Outpatient [address] Laure[address], [place], [place] [phone]   [facility] (Addiction Recovery Care [facility]oc[facility] [address] [place]  [place], [place] [phone] or [phone]   [facility] ([facility]) [address] [place], [place] [phone] Accepts Medicaid  [facility] [place] [address],  [place] [phone] Substance Abuse/Addiction Treatment   [facility] Organization         Address  Phone  Notes  [facility]  [phone]   Angie Fava, PhD [address] Ervin Knack [place], [place]   [phone] or [phone]   [facility] Behavioral   [address] [place], [place] [phone]   [facility] [address] [place], [place] [phone] Insurance/Medicaid/sponsorship through [employer] and Families [address]                                    [place], [place] [phone] Therapy/tele-psych/case    [facility] [address]  [place], [place] [phone]    Dr. Adele Schilder  [phone]   [facility] Dept. 1) [address] [place] 2) [place] 3)  [place] [address], [place] [phone]  [phone]   [place] [phone] or [phone] (After Hours)

## 2015-08-20 ENCOUNTER — Emergency Department (INDEPENDENT_AMBULATORY_CARE_PROVIDER_SITE_OTHER): Payer: 59

## 2015-08-20 ENCOUNTER — Emergency Department (INDEPENDENT_AMBULATORY_CARE_PROVIDER_SITE_OTHER)
Admission: EM | Admit: 2015-08-20 | Discharge: 2015-08-20 | Disposition: A | Discharge status: Home / Self Care | Payer: 59 | Source: Home / Self Care | Attending: Emergency Medicine | Admitting: Emergency Medicine

## 2015-08-20 ENCOUNTER — Encounter (HOSPITAL_COMMUNITY): Payer: Self-pay

## 2015-08-20 DIAGNOSIS — R6889 Other general symptoms and signs: Secondary | ICD-10-CM

## 2015-08-20 MED ORDER — HYDROCODONE-HOMATROPINE 5-1.5 MG/5ML PO SYRP: 5.0000 mL | ORAL_SOLUTION | Freq: Four times a day (QID) | ORAL | Status: DC | PRN

## 2015-08-20 NOTE — ED Provider Notes (Signed)
CSN: [id number]     Arrival date & time 08/20/15  1422 History   First MD Initiated Contact with Patient 08/20/15 1543     Chief Complaint  Patient presents with  . Generalized Body Aches  . Cough  . Chills   (Consider location/radiation/quality/duration/timing/severity/associated sxs/prior Treatment) HPI  She is a 30 year old woman here for evaluation of cough. Her symptoms started 3 days ago with fevers, chills, body aches, sore throat, and cough. She states the fever resolved after the first night, but she continues to have chills and body aches. Cough is largely nonproductive. She does report pain in her chest with the cough. No nausea or vomiting. She is tolerating food and liquids well. She has been taking Tamiflu for the last 2 days. This was a prescription given to her partner last year but never filled.  Past Medical History  Diagnosis Date  . Preterm labor   . Anemia   . Urinary tract infection   . EXBMWUXL(244.0)    Past Surgical History  Procedure Laterality Date  . Therapeutic abortion     Family History  Problem Relation Age of Onset  . Alcohol abuse Neg Hx   . Arthritis Neg Hx   . Asthma Neg Hx   . Birth defects Neg Hx   . COPD Neg Hx   . Depression Neg Hx   . Diabetes Neg Hx   . Drug abuse Neg Hx   . Early death Neg Hx   . Hearing loss Neg Hx   . Heart disease Neg Hx   . Hypertension Neg Hx   . Hyperlipidemia Neg Hx   . Kidney disease Neg Hx   . Learning disabilities Neg Hx   . Mental illness Neg Hx   . Mental retardation Neg Hx   . Miscarriages / Stillbirths Neg Hx   . Stroke Neg Hx   . Vision loss Neg Hx   . Other Neg Hx   . Cancer Maternal Grandmother   . Cancer Maternal Grandfather    Social History  Substance Use Topics  . Smoking status: Current Every Day Smoker -- 0.50 packs/day    Types: Cigarettes    Last Attempt to Quit: 11/21/2013  . Smokeless tobacco: Never Used  . Alcohol Use: Yes     Comment: occasionally   OB History    Gravida Para Term Preterm AB TAB SAB Ectopic Multiple Living   0 0 6     Review of Systems Has been history of present illness Allergies  Review of patient's allergies indicates no known allergies.  Home Medications   Prior to Admission medications   Medication Sig Start Date End Date Taking? Authorizing Provider  HYDROcodone-homatropine (HYCODAN) 5-1.5 MG/5ML syrup Take 5 mLs by mouth every 6 (six) hours as needed for cough. 08/20/15   Charm Rings, MD  ibuprofen (ADVIL,MOTRIN) 800 MG tablet Take 1 tablet (800 mg total) by mouth 3 (three) times daily. 07/14/15   Joycie Peek, PA-C   Meds Ordered and Administered this Visit  Medications - No data to display  BP 125/77 mmHg  Pulse 89  Temp(Src) 98.1 F (36.7 C) (Oral)  Resp 12  SpO2 98%  LMP 07/27/2015 (Exact Date) No data found.   Physical Exam  Constitutional: She is oriented to person, place, and time. She appears well-developed and well-nourished.  Lying on the exam table with blankets over her.  HENT:  Nose: Nose normal.  Mouth/Throat: Oropharynx is clear  and moist. No oropharyngeal exudate.  Neck: Neck supple.  Cardiovascular: Normal rate, regular rhythm and normal heart sounds.   No murmur heard. Pulmonary/Chest: Effort normal and breath sounds normal. No respiratory distress. She has no wheezes. She has no rales.  Lymphadenopathy:    She has no cervical adenopathy.  Neurological: She is alert and oriented to person, place, and time.    ED Course  Procedures (including critical care time)  Labs Review Labs Reviewed - No data to display  Imaging Review Dg Chest 2 View  08/20/2015  CLINICAL DATA:  30 year old female with fever chills and chest pain for 3 days. EXAM: CHEST  2 VIEW COMPARISON:  10/23/2014 FINDINGS: The cardiomediastinal silhouette is unremarkable. There is no evidence of focal airspace disease, pulmonary edema, suspicious pulmonary nodule/mass, pleural effusion, or pneumothorax. No  acute bony abnormalities are identified. IMPRESSION: No active cardiopulmonary disease. Electronically Signed   By: Harmon Pier M.D.   On: 08/20/2015 16:32      MDM   1. Flu-like symptoms    Recommended finishing the course of Tamiflu. Hycodan as needed for cough. Recommended alternating Tylenol and ibuprofen for body aches. Follow-up as needed.    Charm Rings, MD 08/20/15 [phone]

## 2015-08-20 NOTE — ED Notes (Signed)
Pt has had cough,body aches,chills, and chest pains from coughing for 3 days. Pt has taken Tamaflu but it has not helped. Pt alert and oriented

## 2015-08-20 NOTE — Discharge Instructions (Signed)
You have the flu. Please finish the course of Tamiflu. Alternate Tylenol and ibuprofen every 4 hours for the body aches and chest discomfort. Use Hycodan every 4-6 hours as needed for cough. Do not drive all taking this medicine. You can mix honey, water, lemon juice, and whiskey or tea to make a home cough syrup. It will likely be another 3-4 days before you start to feel better. Follow-up as needed.

## 2015-09-28 ENCOUNTER — Inpatient Hospital Stay (HOSPITAL_COMMUNITY)
Admission: AD | Admit: 2015-09-28 | Discharge: 2015-09-28 | Disposition: A | Discharge status: Home / Self Care | Payer: 59 | Source: Ambulatory Visit | Attending: Obstetrics & Gynecology | Admitting: Obstetrics & Gynecology

## 2015-09-28 ENCOUNTER — Encounter (HOSPITAL_COMMUNITY): Payer: Self-pay

## 2015-09-28 DIAGNOSIS — IMO0001 Reserved for inherently not codable concepts without codable children: Secondary | ICD-10-CM

## 2015-09-28 DIAGNOSIS — N946 Dysmenorrhea, unspecified: Secondary | ICD-10-CM | POA: Insufficient documentation

## 2015-09-28 DIAGNOSIS — F17210 Nicotine dependence, cigarettes, uncomplicated: Secondary | ICD-10-CM | POA: Insufficient documentation

## 2015-09-28 LAB — URINALYSIS, ROUTINE W REFLEX MICROSCOPIC
BILIRUBIN URINE: NEGATIVE
Glucose, UA: NEGATIVE mg/dL
Ketones, ur: NEGATIVE mg/dL
LEUKOCYTES UA: NEGATIVE
NITRITE: NEGATIVE
PH: 6 (ref 5.0–8.0)
Protein, ur: NEGATIVE mg/dL
SPECIFIC GRAVITY, URINE: 1.02 (ref 1.005–1.030)

## 2015-09-28 LAB — CBC
HEMATOCRIT: 34.5 % — AB (ref 36.0–46.0)
Hemoglobin: 11.8 g/dL — ABNORMAL LOW (ref 12.0–15.0)
MCH: 32.5 pg (ref 26.0–34.0)
MCHC: 34.2 g/dL (ref 30.0–36.0)
MCV: 95 fL (ref 78.0–100.0)
Platelets: 353 10*3/uL (ref 150–400)
RBC: 3.63 MIL/uL — ABNORMAL LOW (ref 3.87–5.11)
RDW: 12.5 % (ref 11.5–15.5)
WBC: 6.8 10*3/uL (ref 4.0–10.5)

## 2015-09-28 LAB — URINE MICROSCOPIC-ADD ON: RBC / HPF: NONE SEEN RBC/hpf (ref 0–5)

## 2015-09-28 LAB — WET PREP, GENITAL
Clue Cells Wet Prep HPF POC: NONE SEEN
Sperm: NONE SEEN
TRICH WET PREP: NONE SEEN
Yeast Wet Prep HPF POC: NONE SEEN

## 2015-09-28 LAB — POCT PREGNANCY, URINE: PREG TEST UR: NEGATIVE

## 2015-09-28 MED ORDER — KETOROLAC TROMETHAMINE 60 MG/2ML IM SOLN
60.0000 mg | Freq: Once | INTRAMUSCULAR | Status: AC
  Administered 2015-09-28: 60 mg via INTRAMUSCULAR
  Filled 2015-09-28: qty 2

## 2015-09-28 NOTE — MAU Provider Note (Signed)
History     CSN: [id number]  Arrival date and time: 09/28/15 4098   First Provider Initiated Contact with Patient 09/28/15 0830       Chief Complaint  Patient presents with  . Vaginal Bleeding  . Abdominal Pain   HPI  Tonya David is a 30 y.o. female who presents for abdominal pain & vaginal bleeding. Concerned she is having a miscarriage, though she had a negative pregnancy test at home last week.  Vaginal bleeding began Saturday; which is when she was expecting her period. States since Saturday, bleeding has alternated between light bleeding not requiring a pad, to heavy bleeding requiring pad & tampon. Also reports abdominal cramping that she rates 10/10. Has been taking percocet for pain with minimal relief.  Denies n/v/d, constipation, dysuria, fever, or vaginal discharge.  Not on birth control. Stopped using depo provera last year. Reports normal periods every month since discontinuing depo.   OB History    Gravida Para Term Preterm AB TAB SAB Ectopic Multiple Living   0 0 6      Past Medical History  Diagnosis Date  . Preterm labor   . Anemia   . Urinary tract infection   . [id number](956.2)     Past Surgical History  Procedure Laterality Date  . Therapeutic abortion      Family History  Problem Relation Age of Onset  . Alcohol abuse Neg Hx   . Arthritis Neg Hx   . Asthma Neg Hx   . Birth defects Neg Hx   . COPD Neg Hx   . Depression Neg Hx   . Diabetes Neg Hx   . Drug abuse Neg Hx   . Early death Neg Hx   . Hearing loss Neg Hx   . Heart disease Neg Hx   . Hypertension Neg Hx   . Hyperlipidemia Neg Hx   . Kidney disease Neg Hx   . Learning disabilities Neg Hx   . Mental illness Neg Hx   . Mental retardation Neg Hx   . Miscarriages / Stillbirths Neg Hx   . Stroke Neg Hx   . Vision loss Neg Hx   . Other Neg Hx   . Cancer Maternal Grandmother   . Cancer Maternal Grandfather     Social History  Substance Use Topics  . Smoking  status: Current Every Day Smoker -- 0.50 packs/day    Types: Cigarettes  . Smokeless tobacco: Never Used  . Alcohol Use: Yes     Comment: occasionally    Allergies: No Known Allergies  Prescriptions prior to admission  Medication Sig Dispense Refill Last Dose  . HYDROcodone-homatropine (HYCODAN) 5-1.5 MG/5ML syrup Take 5 mLs by mouth every 6 (six) hours as needed for cough. (Patient not taking: Reported on 09/28/2015) 120 mL 0   . ibuprofen (ADVIL,MOTRIN) 800 MG tablet Take 1 tablet (800 mg total) by mouth 3 (three) times daily. (Patient not taking: Reported on 09/28/2015) 21 tablet 0 Unknown at Unknown time    Review of Systems  Constitutional: Negative.   Gastrointestinal: Positive for abdominal pain. Negative for nausea, vomiting, diarrhea and constipation.  Genitourinary: Negative for dysuria.       + vaginal bleeding   Physical Exam   Blood pressure 136/94, pulse 84, temperature 98.2 F (36.8 C), temperature source Oral, resp. rate 18, height  (1.676 m), weight 139 lb 3.2 oz (63.141 kg), last menstrual period 09/25/2015, not currently breastfeeding.  Physical  Exam  Nursing note and vitals reviewed. Constitutional: She is oriented to person, place, and time. She appears well-developed and well-nourished. No distress.  HENT:  Head: Normocephalic and atraumatic.  Eyes: Conjunctivae are normal. Right eye exhibits no discharge. Left eye exhibits no discharge. No scleral icterus.  Neck: Normal range of motion.  Cardiovascular: Normal rate, regular rhythm and normal heart sounds.   No murmur heard. Respiratory: Effort normal and breath sounds normal. No respiratory distress. She has no wheezes.  GI: Soft. Bowel sounds are normal. She exhibits no distension. There is tenderness. There is no rebound.  Genitourinary: Uterus normal. Cervix exhibits no motion tenderness and no friability. Right adnexum displays no mass and no tenderness. Left adnexum displays no mass and no  tenderness. There is bleeding (small amount of dark red blood appropriate for menses) in the vagina. No vaginal discharge found.  Neurological: She is alert and oriented to person, place, and time.  Skin: Skin is warm and dry. She is not diaphoretic.  Psychiatric: She has a normal mood and affect. Her behavior is normal. Judgment and thought content normal.    MAU Course  Procedures Results for orders placed or performed during the hospital encounter of 09/28/15 (from the past 24 hour(s))  Urinalysis, Routine w reflex microscopic (not at [facility])     Status: Abnormal   Collection Time: 09/28/15  7:55 AM  Result Value Ref Range   Color, Urine YELLOW YELLOW   APPearance CLEAR CLEAR   Specific Gravity, Urine 1.020 1.005 - 1.030   pH 6.0 5.0 - 8.0   Glucose, UA NEGATIVE NEGATIVE mg/dL   Hgb urine dipstick TRACE (A) NEGATIVE   Bilirubin Urine NEGATIVE NEGATIVE   Ketones, ur NEGATIVE NEGATIVE mg/dL   Protein, ur NEGATIVE NEGATIVE mg/dL   Nitrite NEGATIVE NEGATIVE   Leukocytes, UA NEGATIVE NEGATIVE  Urine microscopic-add on     Status: Abnormal   Collection Time: 09/28/15  7:55 AM  Result Value Ref Range   Squamous Epithelial / LPF 0-5 (A) NONE SEEN   WBC, UA 0-5 0 - 5 WBC/hpf   RBC / HPF NONE SEEN 0 - 5 RBC/hpf   Bacteria, UA FEW (A) NONE SEEN  Pregnancy, urine POC     Status: None   Collection Time: 09/28/15  8:05 AM  Result Value Ref Range   Preg Test, Ur NEGATIVE NEGATIVE  CBC     Status: Abnormal   Collection Time: 09/28/15  8:47 AM  Result Value Ref Range   WBC 6.8 4.0 - 10.5 K/uL   RBC 3.63 (L) 3.87 - 5.11 MIL/uL   Hemoglobin 11.8 (L) 12.0 - 15.0 g/dL   HCT 16.1 (L) 09.6 - 04.5 %   MCV 95.0 78.0 - 100.0 fL   MCH 32.5 26.0 - 34.0 pg   MCHC 34.2 30.0 - 36.0 g/dL   RDW 40.9 81.1 - 91.4 %   Platelets 353 150 - 400 K/uL  Wet prep, genital     Status: Abnormal   Collection Time: 09/28/15  9:34 AM  Result Value Ref Range   Yeast Wet Prep HPF POC NONE SEEN NONE SEEN   Trich,  Wet Prep NONE SEEN NONE SEEN   Clue Cells Wet Prep HPF POC NONE SEEN NONE SEEN   WBC, Wet Prep HPF POC FEW (A) NONE SEEN   Sperm NONE SEEN     MDM UPT negative Toradol 60 mg IM Pt reports mild improvement in pain CBC, wet prep, GC/CT  Assessment and Plan  A: 1. Dysmenorrhea   2. Onset of menses    P: Discharge home GC/CT pending Discussed use of NSAIDs for pain - use per bottle instructions Can use heating pad on affected area    Judeth HornErin Hutch Rhett 09/28/2015, 8:28 AM

## 2015-09-28 NOTE — Discharge Instructions (Signed)

## 2015-09-28 NOTE — MAU Note (Signed)
Pt states she thought she was starting her period on Saturday, bleeding was light.  Started bleeding heavier approx 10 hours later, cramping, Bleeding stopped, was intermittent after that.  Had intercourse last night, began cramping & having heavy bleeding.  Neg HPT 1 1/2 weeks ago.

## 2015-09-29 LAB — GC/CHLAMYDIA PROBE AMP (~~LOC~~) NOT AT ARMC
CHLAMYDIA, DNA PROBE: NEGATIVE
NEISSERIA GONORRHEA: NEGATIVE

## 2015-09-30 ENCOUNTER — Other Ambulatory Visit (HOSPITAL_COMMUNITY): Payer: Self-pay | Admitting: Emergency Medicine

## 2015-11-29 ENCOUNTER — Inpatient Hospital Stay (HOSPITAL_COMMUNITY)
Admission: AD | Admit: 2015-11-29 | Discharge: 2015-11-30 | Disposition: A | Discharge status: Home / Self Care | Payer: Self-pay | Source: Ambulatory Visit | Attending: Obstetrics & Gynecology | Admitting: Obstetrics & Gynecology

## 2015-11-29 ENCOUNTER — Inpatient Hospital Stay (HOSPITAL_COMMUNITY): Payer: Self-pay

## 2015-11-29 ENCOUNTER — Encounter (HOSPITAL_COMMUNITY): Payer: Self-pay

## 2015-11-29 DIAGNOSIS — B9689 Other specified bacterial agents as the cause of diseases classified elsewhere: Secondary | ICD-10-CM

## 2015-11-29 DIAGNOSIS — Z3202 Encounter for pregnancy test, result negative: Secondary | ICD-10-CM | POA: Insufficient documentation

## 2015-11-29 DIAGNOSIS — N760 Acute vaginitis: Secondary | ICD-10-CM | POA: Insufficient documentation

## 2015-11-29 DIAGNOSIS — N3000 Acute cystitis without hematuria: Secondary | ICD-10-CM | POA: Insufficient documentation

## 2015-11-29 DIAGNOSIS — N730 Acute parametritis and pelvic cellulitis: Secondary | ICD-10-CM | POA: Insufficient documentation

## 2015-11-29 DIAGNOSIS — F17210 Nicotine dependence, cigarettes, uncomplicated: Secondary | ICD-10-CM | POA: Insufficient documentation

## 2015-11-29 DIAGNOSIS — Z7982 Long term (current) use of aspirin: Secondary | ICD-10-CM | POA: Insufficient documentation

## 2015-11-29 LAB — CBC
HEMATOCRIT: 32.5 % — AB (ref 36.0–46.0)
HEMOGLOBIN: 11 g/dL — AB (ref 12.0–15.0)
MCH: 32.4 pg (ref 26.0–34.0)
MCHC: 33.8 g/dL (ref 30.0–36.0)
MCV: 95.6 fL (ref 78.0–100.0)
Platelets: 437 10*3/uL — ABNORMAL HIGH (ref 150–400)
RBC: 3.4 MIL/uL — ABNORMAL LOW (ref 3.87–5.11)
RDW: 13 % (ref 11.5–15.5)
WBC: 14.1 10*3/uL — AB (ref 4.0–10.5)

## 2015-11-29 LAB — URINALYSIS, ROUTINE W REFLEX MICROSCOPIC
BILIRUBIN URINE: NEGATIVE
Glucose, UA: NEGATIVE mg/dL
Hgb urine dipstick: NEGATIVE
KETONES UR: NEGATIVE mg/dL
Leukocytes, UA: NEGATIVE
NITRITE: POSITIVE — AB
PH: 5.5 (ref 5.0–8.0)
Protein, ur: NEGATIVE mg/dL
Specific Gravity, Urine: 1.025 (ref 1.005–1.030)

## 2015-11-29 LAB — URINE MICROSCOPIC-ADD ON

## 2015-11-29 LAB — WET PREP, GENITAL
SPERM: NONE SEEN
TRICH WET PREP: NONE SEEN
Yeast Wet Prep HPF POC: NONE SEEN

## 2015-11-29 LAB — POCT PREGNANCY, URINE: Preg Test, Ur: NEGATIVE

## 2015-11-29 MED ORDER — DOXYCYCLINE HYCLATE 100 MG PO TABS
100.0000 mg | ORAL_TABLET | Freq: Once | ORAL | Status: AC
  Administered 2015-11-29: 100 mg via ORAL
  Filled 2015-11-29: qty 1

## 2015-11-29 MED ORDER — SIMETHICONE 80 MG PO CHEW
80.0000 mg | CHEWABLE_TABLET | Freq: Once | ORAL | Status: AC
  Administered 2015-11-29: 80 mg via ORAL
  Filled 2015-11-29: qty 1

## 2015-11-29 MED ORDER — CEFTRIAXONE SODIUM 250 MG IJ SOLR
250.0000 mg | Freq: Once | INTRAMUSCULAR | Status: AC
  Administered 2015-11-29: 250 mg via INTRAMUSCULAR
  Filled 2015-11-29: qty 250

## 2015-11-29 MED ORDER — DOXYCYCLINE HYCLATE 100 MG PO CAPS: 100.0000 mg | ORAL_CAPSULE | Freq: Two times a day (BID) | ORAL | Status: DC

## 2015-11-29 MED ORDER — SULFAMETHOXAZOLE-TRIMETHOPRIM 800-160 MG PO TABS: 1.0000 | ORAL_TABLET | Freq: Two times a day (BID) | ORAL | Status: DC

## 2015-11-29 MED ORDER — SULFAMETHOXAZOLE-TRIMETHOPRIM 800-160 MG PO TABS
1.0000 | ORAL_TABLET | Freq: Once | ORAL | Status: AC
  Administered 2015-11-29: 1 via ORAL
  Filled 2015-11-29: qty 1

## 2015-11-29 MED ORDER — CYCLOBENZAPRINE HCL 5 MG PO TABS
5.0000 mg | ORAL_TABLET | Freq: Once | ORAL | Status: AC
  Administered 2015-11-29: 5 mg via ORAL
  Filled 2015-11-29: qty 1

## 2015-11-29 MED ORDER — METRONIDAZOLE 500 MG PO TABS: 500.0000 mg | ORAL_TABLET | Freq: Two times a day (BID) | ORAL | Status: DC

## 2015-11-29 NOTE — MAU Provider Note (Signed)
History     CSN: [id number]  Arrival date and time: 11/29/15 2006   First Provider Initiated Contact with Patient 11/29/15 2050       Chief Complaint  Patient presents with  . Abdominal Pain  . Rectal Pain  . Vaginal Discharge   HPI  Tonya David is a 30 y.o. female who presents with abdominal pain & vaginal discharge. Symptoms began today with lower abdominal pain. Pain is constant and occasionally radiates to rectum with shooting pains. Rates pain 9/10. Has not treated. Pain worse with voiding, moving, and trying to pass gas. Denies n/v/d, constipation, fever/chills, hemorrhoids, blood in stool, or appetite changes.  Also complaints of foul smelling vaginal discharge as of today. Denies vaginal bleeding, dyspareunia, or post coital bleeding.  Reports suprapubic pain with urination today; denies burning with urination, flank pain, hematuria, or increased frequency. Pt is sexually active; no contraception. LMP 5/18    OB History    Gravida Para Term Preterm AB TAB SAB Ectopic Multiple Living   9 6 2 4 3 2 1  0 0 6      Past Medical History  Diagnosis Date  . Preterm labor   . Anemia   . Urinary tract infection   . [id number](478.2Headache(784.0)     Past Surgical History  Procedure Laterality Date  . Therapeutic abortion      Family History  Problem Relation Age of Onset  . Alcohol abuse Neg Hx   . Arthritis Neg Hx   . Asthma Neg Hx   . Birth defects Neg Hx   . COPD Neg Hx   . Depression Neg Hx   . Diabetes Neg Hx   . Drug abuse Neg Hx   . Early death Neg Hx   . Hearing loss Neg Hx   . Heart disease Neg Hx   . Hypertension Neg Hx   . Hyperlipidemia Neg Hx   . Kidney disease Neg Hx   . Learning disabilities Neg Hx   . Mental illness Neg Hx   . Mental retardation Neg Hx   . Miscarriages / Stillbirths Neg Hx   . Stroke Neg Hx   . Vision loss Neg Hx   . Other Neg Hx   . Cancer Maternal Grandmother   . Cancer Maternal Grandfather     Social History  Substance Use  Topics  . Smoking status: Current Every Day Smoker -- 0.50 packs/day    Types: Cigarettes  . Smokeless tobacco: Never Used  . Alcohol Use: Yes     Comment: occasionally    Allergies: No Known Allergies  Prescriptions prior to admission  Medication Sig Dispense Refill Last Dose  . acetaminophen (TYLENOL) 500 MG tablet Take 1,000 mg by mouth every 6 (six) hours as needed.    11/29/2015 at Unknown time  . Acetaminophen-Caff-Pyrilamine (MIDOL COMPLETE) 500-60-15 MG TABS Take 2 tablets by mouth 2 (two) times daily as needed (For cramping.).   11/29/2015 at Unknown time  . aspirin-acetaminophen-caffeine (EXCEDRIN MIGRAINE) 250-250-65 MG tablet Take 2 tablets by mouth every 6 (six) hours as needed for headache.   Past Month at Unknown time  . oxyCODONE-acetaminophen (PERCOCET) 10-325 MG tablet Take 1 tablet by mouth every 4 (four) hours as needed for pain.   11/29/2015 at Unknown time  . HYDROcodone-homatropine (HYCODAN) 5-1.5 MG/5ML syrup Take 5 mLs by mouth every 6 (six) hours as needed for cough. (Patient not taking: Reported on 09/28/2015) 120 mL 0 Not Taking at Unknown time  . ibuprofen (  ADVIL,MOTRIN) 800 MG tablet Take 1 tablet (800 mg total) by mouth 3 (three) times daily. (Patient not taking: Reported on 09/28/2015) 21 tablet 0 Not Taking at Unknown time    Review of Systems  Constitutional: Negative.   Gastrointestinal: Positive for abdominal pain. Negative for nausea, vomiting, diarrhea, constipation and blood in stool.  Genitourinary: Positive for dysuria. Negative for urgency, frequency, hematuria and flank pain.       + vaginal discharge No vaginal bleeding, dyspareunia, or post coital bleeding   Physical Exam   Blood pressure 125/73, pulse 83, temperature 99.1 F (37.3 C), temperature source Oral, resp. rate 16, height 5\' 6"  (1.676 m), weight 140 lb (63.504 kg), last menstrual period 11/25/2015, SpO2 99 %, not currently breastfeeding.  Physical Exam  Nursing note and vitals  reviewed. Constitutional: She is oriented to person, place, and time. She appears well-developed and well-nourished. No distress.  HENT:  Head: Normocephalic and atraumatic.  Eyes: Conjunctivae are normal. Right eye exhibits no discharge. Left eye exhibits no discharge. No scleral icterus.  Neck: Normal range of motion.  Cardiovascular: Normal rate, regular rhythm and normal heart sounds.   No murmur heard. Respiratory: Effort normal and breath sounds normal. No respiratory distress. She has no wheezes.  GI: Soft. Bowel sounds are normal. She exhibits no distension and no mass. There is tenderness in the right lower quadrant and suprapubic area. There is no rigidity, no rebound, no guarding and no tenderness at McBurney's point.  Genitourinary: Uterus normal. Cervix exhibits motion tenderness, discharge and friability. Right adnexum displays tenderness. Left adnexum displays no mass, no tenderness and no fullness. No bleeding in the vagina. Vaginal discharge (moderate amount of thin yellow discharge; malodorous) found.  Neurological: She is alert and oriented to person, place, and time.  Skin: Skin is warm and dry. She is not diaphoretic.  Psychiatric: She has a normal mood and affect. Her behavior is normal. Judgment and thought content normal.    MAU Course  Procedures Results for orders placed or performed during the hospital encounter of 11/29/15 (from the past 24 hour(s))  Urinalysis, Routine w reflex microscopic (not at [facility])     Status: Abnormal   Collection Time: 11/29/15  8:15 PM  Result Value Ref Range   Color, Urine YELLOW YELLOW   APPearance HAZY (A) CLEAR   Specific Gravity, Urine 1.025 1.005 - 1.030   pH 5.5 5.0 - 8.0   Glucose, UA NEGATIVE NEGATIVE mg/dL   Hgb urine dipstick NEGATIVE NEGATIVE   Bilirubin Urine NEGATIVE NEGATIVE   Ketones, ur NEGATIVE NEGATIVE mg/dL   Protein, ur NEGATIVE NEGATIVE mg/dL   Nitrite POSITIVE (A) NEGATIVE   Leukocytes, UA NEGATIVE NEGATIVE   Urine microscopic-add on     Status: Abnormal   Collection Time: 11/29/15  8:15 PM  Result Value Ref Range   Squamous Epithelial / LPF 0-5 (A) NONE SEEN   WBC, UA 0-5 0 - 5 WBC/hpf   RBC / HPF 0-5 0 - 5 RBC/hpf   Bacteria, UA MANY (A) NONE SEEN   Urine-Other MUCOUS PRESENT   Pregnancy, urine POC     Status: None   Collection Time: 11/29/15  8:30 PM  Result Value Ref Range   Preg Test, Ur NEGATIVE NEGATIVE  CBC     Status: Abnormal   Collection Time: 11/29/15  9:03 PM  Result Value Ref Range   WBC 14.1 (H) 4.0 - 10.5 K/uL   RBC 3.40 (L) 3.87 - 5.11 MIL/uL   Hemoglobin 11.0 (  L) 12.0 - 15.0 g/dL   HCT 16.1 (L) 09.6 - 04.5 %   MCV 95.6 78.0 - 100.0 fL   MCH 32.4 26.0 - 34.0 pg   MCHC 33.8 30.0 - 36.0 g/dL   RDW 40.9 81.1 - 91.4 %   Platelets 437 (H) 150 - 400 K/uL  Wet prep, genital     Status: Abnormal   Collection Time: 11/29/15  9:38 PM  Result Value Ref Range   Yeast Wet Prep HPF POC NONE SEEN NONE SEEN   Trich, Wet Prep NONE SEEN NONE SEEN   Clue Cells Wet Prep HPF POC PRESENT (A) NONE SEEN   WBC, Wet Prep HPF POC FEW (A) NONE SEEN   Sperm NONE SEEN    US Transvaginal Non-ob  11/29/2015  CLINICAL DATA:  Lower abdominal/pelvic pain today. Elevated white blood cell count. EXAM: TRANSABDOMINAL AND TRANSVAGINAL ULTRASOUND OF PELVIS TECHNIQUE: Both transabdominal and transvaginal ultrasound examinations of the pelvis were performed. Transabdominal technique was performed for global imaging of the pelvis including uterus, ovaries, adnexal regions, and pelvic cul-de-sac. It was necessary to proceed with endovaginal exam following the transabdominal exam to visualize the uterus, endometrium, ovaries and adnexa. COMPARISON:  Pelvic ultrasound 11/19/2012. CT abdomen/ pelvis 09/01/2014 FINDINGS: Uterus Measurements: 7.9 x 4.4 x 4.3 cm. No fibroids or other mass visualized. Endometrium Thickness: 5 mm.  No focal abnormality visualized. Right ovary Measurements: 3.2 x 2.2 x 2.2 cm. Normal  appearance/no adnexal mass. Left ovary Measurements: 3.8 x 1.9 x 1.6 cm. There is a 1.9 x 1.2 x 1.2 cm complex predominantly hypoechoic lesion in the left ovary, likely a complex cyst. No adnexal mass. Other findings Trace free fluid in the cul de sac. IMPRESSION: 1. Small complex cyst on the left ovary, 1.9 cm maximal dimension, likely an involuting corpus luteum. 2. Otherwise normal pelvic ultrasound. Electronically Signed   By: Rubye Oaks M.D.   On: 11/29/2015 23:10   US Pelvis Complete  11/29/2015  CLINICAL DATA:  Lower abdominal/pelvic pain today. Elevated white blood cell count. EXAM: TRANSABDOMINAL AND TRANSVAGINAL ULTRASOUND OF PELVIS TECHNIQUE: Both transabdominal and transvaginal ultrasound examinations of the pelvis were performed. Transabdominal technique was performed for global imaging of the pelvis including uterus, ovaries, adnexal regions, and pelvic cul-de-sac. It was necessary to proceed with endovaginal exam following the transabdominal exam to visualize the uterus, endometrium, ovaries and adnexa. COMPARISON:  Pelvic ultrasound 11/19/2012. CT abdomen/ pelvis 09/01/2014 FINDINGS: Uterus Measurements: 7.9 x 4.4 x 4.3 cm. No fibroids or other mass visualized. Endometrium Thickness: 5 mm.  No focal abnormality visualized. Right ovary Measurements: 3.2 x 2.2 x 2.2 cm. Normal appearance/no adnexal mass. Left ovary Measurements: 3.8 x 1.9 x 1.6 cm. There is a 1.9 x 1.2 x 1.2 cm complex predominantly hypoechoic lesion in the left ovary, likely a complex cyst. No adnexal mass. Other findings Trace free fluid in the cul de sac. IMPRESSION: 1. Small complex cyst on the left ovary, 1.9 cm maximal dimension, likely an involuting corpus luteum. 2. Otherwise normal pelvic ultrasound. Electronically Signed   By: Rubye Oaks M.D.   On: 11/29/2015 23:10    MDM UPT negative Simethacone & flexeril Ultrasound normal Initial doses of abx given in MAU as pt can't pick them up until  tomorrow  Assessment and Plan  A: 1. Acute cystitis without hematuria   2. PID (acute pelvic inflammatory disease)   3. BV (bacterial vaginosis)     P: Discharge home Rx septra, flagyl, doxycycline GC/CT & urine  culture pending If symptoms worsen or fever develops, go to ED  Judeth Horn 11/29/2015, 8:49 PM

## 2015-11-29 NOTE — MAU Note (Signed)
Pt reports she has had sharp pain in her lower abd that radiate into her rectum all day today. Denies nausea, vomiting, diarrhea or fever.

## 2015-11-29 NOTE — Discharge Instructions (Signed)
Abdominal Pain, Adult Many things can cause belly (abdominal) pain. Most times, the belly pain is not dangerous. Many cases of belly pain can be watched and treated at home. HOME CARE   Do not take medicines that help you go poop (laxatives) unless told to by your doctor.  Only take medicine as told by your doctor.  Eat or drink as told by your doctor. Your doctor will tell you if you should be on a special diet. GET HELP IF:  You do not know what is causing your belly pain.  You have belly pain while you are sick to your stomach (nauseous) or have runny poop (diarrhea).  You have pain while you pee or poop.  Your belly pain wakes you up at night.  You have belly pain that gets worse or better when you eat.  You have belly pain that gets worse when you eat fatty foods.  You have a fever. GET HELP RIGHT AWAY IF:   The pain does not go away within 2 hours.  You keep throwing up (vomiting).  The pain changes and is only in the right or left part of the belly.  You have bloody or tarry looking poop. MAKE SURE YOU:   Understand these instructions.  Will watch your condition.  Will get help right away if you are not doing well or get worse.   This information is not intended to replace advice given to you by your health care provider. Make sure you discuss any questions you have with your health care provider.   Document Released: 12/13/2007 Document Revised: 07/17/2014 Document Reviewed: 03/05/2013 Elsevier Interactive Patient Education 2016 Elsevier Inc. Urinary Tract Infection Urinary tract infections (UTIs) can develop anywhere along your urinary tract. Your urinary tract is your body's drainage system for removing wastes and extra water. Your urinary tract includes two kidneys, two ureters, a bladder, and a urethra. Your kidneys are a pair of bean-shaped organs. Each kidney is about the size of your fist. They are located below your ribs, one on each side of your  spine. CAUSES Infections are caused by microbes, which are microscopic organisms, including fungi, viruses, and bacteria. These organisms are so small that they can only be seen through a microscope. Bacteria are the microbes that most commonly cause UTIs. SYMPTOMS  Symptoms of UTIs may vary by age and gender of the patient and by the location of the infection. Symptoms in young women typically include a frequent and intense urge to urinate and a painful, burning feeling in the bladder or urethra during urination. Older women and men are more likely to be tired, shaky, and weak and have muscle aches and abdominal pain. A fever may mean the infection is in your kidneys. Other symptoms of a kidney infection include pain in your back or sides below the ribs, nausea, and vomiting. DIAGNOSIS To diagnose a UTI, your caregiver will ask you about your symptoms. Your caregiver will also ask you to provide a urine sample. The urine sample will be tested for bacteria and white blood cells. White blood cells are made by your body to help fight infection. TREATMENT  Typically, UTIs can be treated with medication. Because most UTIs are caused by a bacterial infection, they usually can be treated with the use of antibiotics. The choice of antibiotic and length of treatment depend on your symptoms and the type of bacteria causing your infection. HOME CARE INSTRUCTIONS  If you were prescribed antibiotics, take them exactly as your  caregiver instructs you. Finish the medication even if you feel better after you have only taken some of the medication.  Drink enough water and fluids to keep your urine clear or pale yellow.  Avoid caffeine, tea, and carbonated beverages. They tend to irritate your bladder.  Empty your bladder often. Avoid holding urine for long periods of time.  Empty your bladder before and after sexual intercourse.  After a bowel movement, women should cleanse from front to back. Use each tissue  only once. SEEK MEDICAL CARE IF:   You have back pain.  You develop a fever.  Your symptoms do not begin to resolve within 3 days. SEEK IMMEDIATE MEDICAL CARE IF:   You have severe back pain or lower abdominal pain.  You develop chills.  You have nausea or vomiting.  You have continued burning or discomfort with urination. MAKE SURE YOU:   Understand these instructions.  Will watch your condition.  Will get help right away if you are not doing well or get worse.   This information is not intended to replace advice given to you by your health care provider. Make sure you discuss any questions you have with your health care provider.   Document Released: 04/05/2005 Document Revised: 03/17/2015 Document Reviewed: 08/04/2011 Elsevier Interactive Patient Education 2016 Elsevier Inc. Pelvic Inflammatory Disease Pelvic inflammatory disease (PID) refers to an infection in some or all of the female organs. The infection can be in the uterus, ovaries, fallopian tubes, or the surrounding tissues in the pelvis. PID can cause abdominal or pelvic pain that comes on suddenly (acute pelvic pain). PID is a serious infection because it can lead to lasting (chronic) pelvic pain or the inability to have children (infertility). CAUSES This condition is most often caused by an infection that is spread during sexual contact. However, the infection can also be caused by the normal bacteria that are found in the vaginal tissues if these bacteria travel upward into the reproductive organs. PID can also occur following:  The birth of a baby.  A miscarriage.  An abortion.  Major pelvic surgery.  The use of an intrauterine device (IUD).  A sexual assault. RISK FACTORS This condition is more likely to develop in women who:  Are younger than 28 years of age.  Are sexually active at [facility] age.  Use nonbarrier contraception.  Have multiple sexual partners.  Have sex with someone who has  symptoms of an STD (sexually transmitted disease).  Use oral contraception. At times, certain behaviors can also increase the possibility of getting PID, such as:  Using a vaginal douche.  Having an IUD in place. SYMPTOMS Symptoms of this condition include:  Abdominal or pelvic pain.  Fever.  Chills.  Abnormal vaginal discharge.  Abnormal uterine bleeding.  Unusual pain shortly after the end of a menstrual period.  Painful urination.  Pain with sexual intercourse.  Nausea and vomiting. DIAGNOSIS To diagnose this condition, your health care provider will do a physical exam and take your medical history. A pelvic exam typically reveals great tenderness in the uterus and the surrounding pelvic tissues. You may also have tests, such as:  Lab tests, including a pregnancy test, blood tests, and urine test.  Culture tests of the vagina and cervix to check for an STD.  Ultrasound.  A laparoscopic procedure to look inside the pelvis.  Examining vaginal secretions under a microscope. TREATMENT Treatment for this condition may involve one or more approaches.  Antibiotic medicines may be prescribed to  be taken by mouth.  Sexual partners may need to be treated if the infection is caused by an STD.  For more severe cases, hospitalization may be needed to give antibiotics directly into a vein through an IV tube.  Surgery may be needed if other treatments do not help, but this is rare. It may take weeks until you are completely well. If you are diagnosed with PID, you should also be checked for human immunodeficiency virus (HIV). Your health care provider may test you for infection again 3 months after treatment. You should not have unprotected sex. HOME CARE INSTRUCTIONS  Take over-the-counter and prescription medicines only as told by your health care provider.  If you were prescribed an antibiotic medicine, take it as told by your health care provider. Do not stop taking the  antibiotic even if you start to feel better.  Do not have sexual intercourse until treatment is completed or as told by your health care provider. If PID is confirmed, your recent sexual partners will need treatment, especially if you had unprotected sex.  Keep all follow-up visits as told by your health care provider. This is important. SEEK MEDICAL CARE IF:  You have increased or abnormal vaginal discharge.  Your pain does not improve.  You vomit.  You have a fever.  You cannot tolerate your medicines.  Your partner has an STD.  You have pain when you urinate. SEEK IMMEDIATE MEDICAL CARE IF:  You have increased abdominal or pelvic pain.  You have chills.  Your symptoms are not better in 72 hours even with treatment.   This information is not intended to replace advice given to you by your health care provider. Make sure you discuss any questions you have with your health care provider.   Document Released: 06/26/2005 Document Revised: 03/17/2015 Document Reviewed: 08/03/2014 Elsevier Interactive Patient Education [employer]

## 2015-11-30 LAB — GC/CHLAMYDIA PROBE AMP (~~LOC~~) NOT AT ARMC
CHLAMYDIA, DNA PROBE: NEGATIVE
Neisseria Gonorrhea: POSITIVE — AB

## 2015-12-02 LAB — URINE CULTURE

## 2016-03-08 IMAGING — CT CT ABD-PELV W/ CM
2 of 4 series · 13 of 46 positions shown, 15 images · IV contrast (omnipaque)
Comparison: None.

CLINICAL DATA: Mid abdominal pain radiating into the back for 2
months

EXAM:
CT ABDOMEN AND PELVIS WITH CONTRAST
TECHNIQUE: Multidetector CT imaging of the abdomen and pelvis was performed
using the standard protocol following bolus administration of
intravenous contrast.
CONTRAST:  100mL OMNIPAQUE IOHEXOL 300 MG/ML  SOLN

[Series 201: routine, idose (2) · axial · 0.60mm/px · z∈[-609,-234]mm · 10 of 89 slices shown, 12 images]
[im 7/89  soft-tissue]
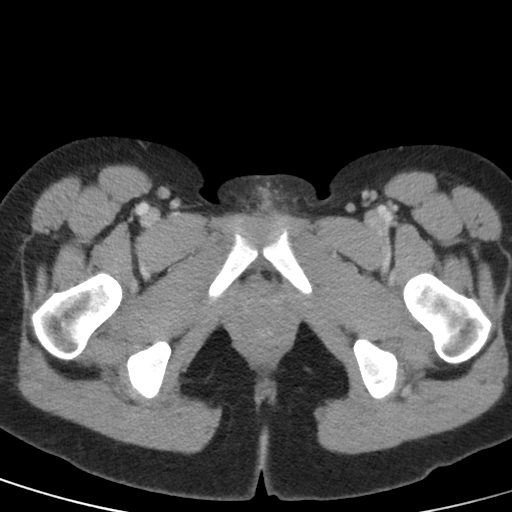
[im 7/89  bone]
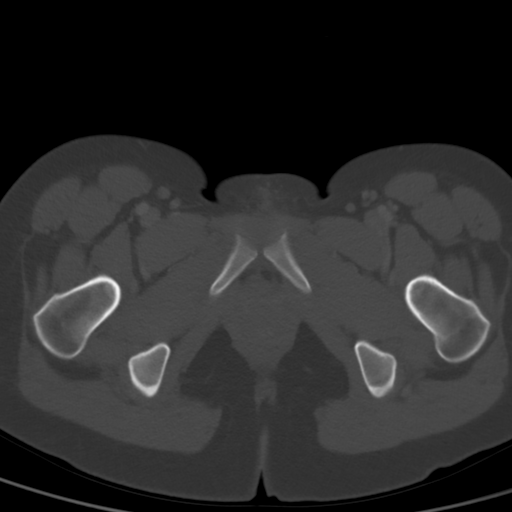
[im 14/89  soft-tissue]
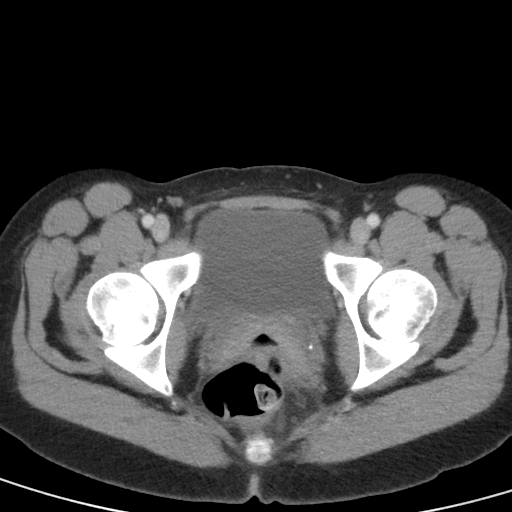
[im 24/89  soft-tissue]
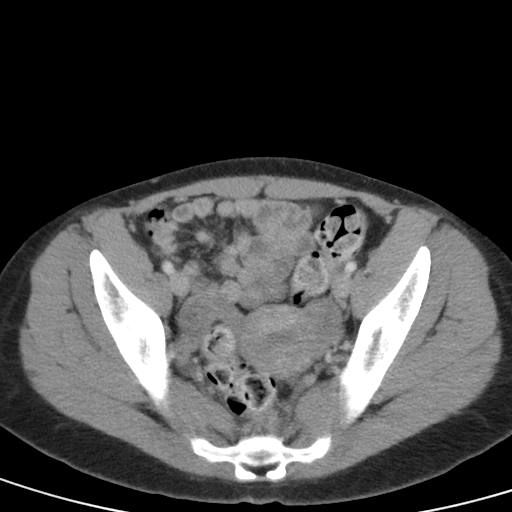
[im 31/89  soft-tissue]
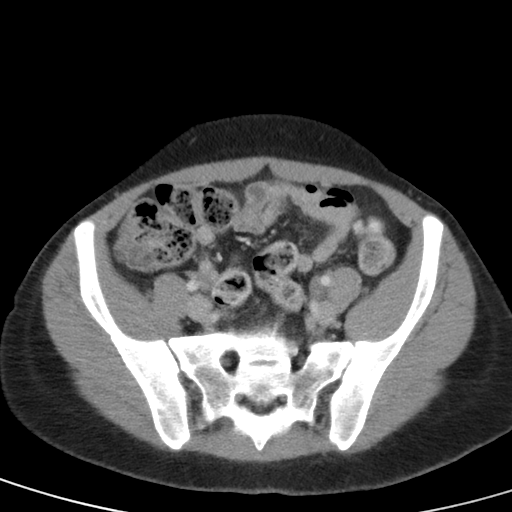
[im 41/89  soft-tissue]
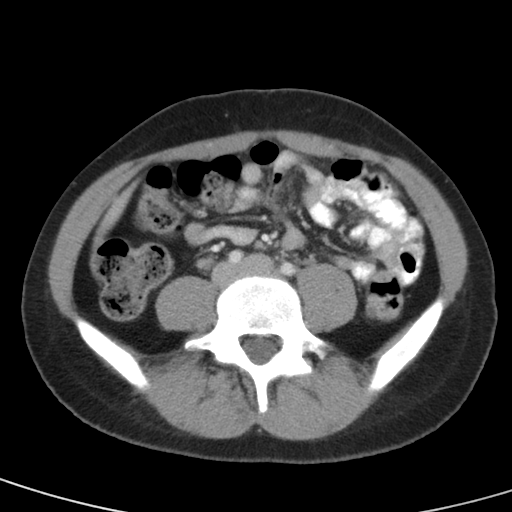
[im 48/89  soft-tissue]
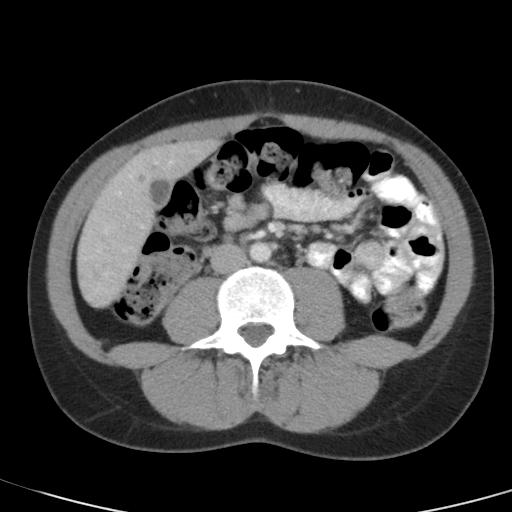
[im 58/89  soft-tissue]
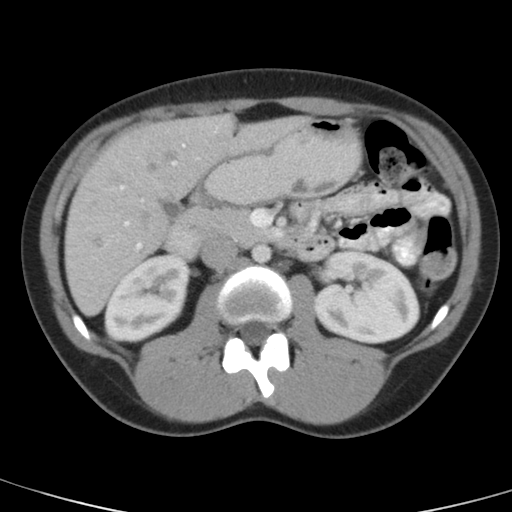
[im 65/89  soft-tissue]
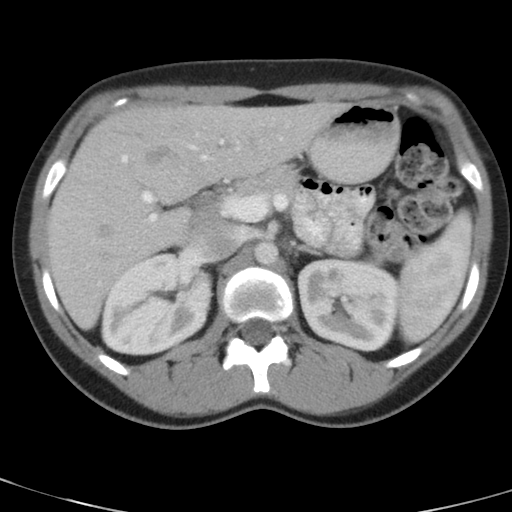
[im 75/89  soft-tissue]
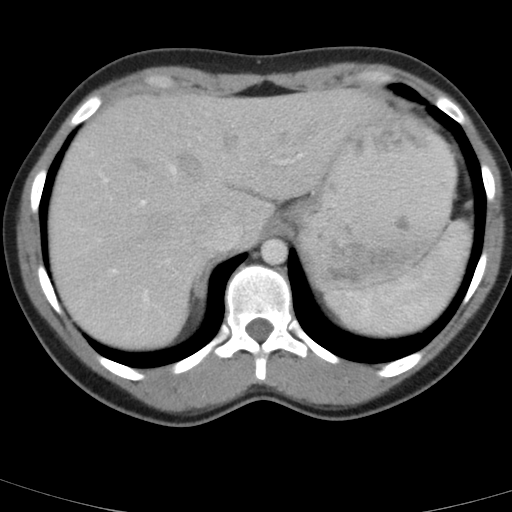
[im 75/89  bone]
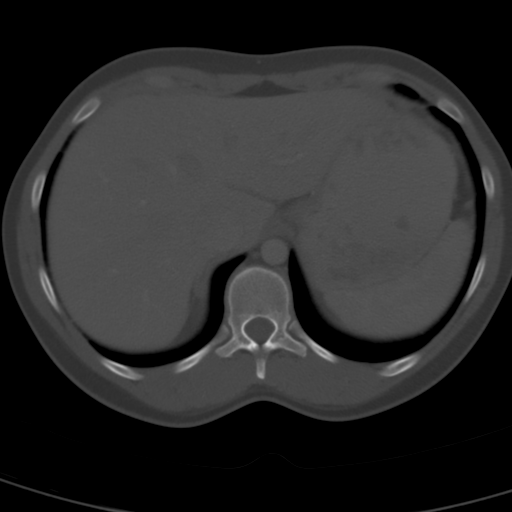
[im 82/89  soft-tissue]
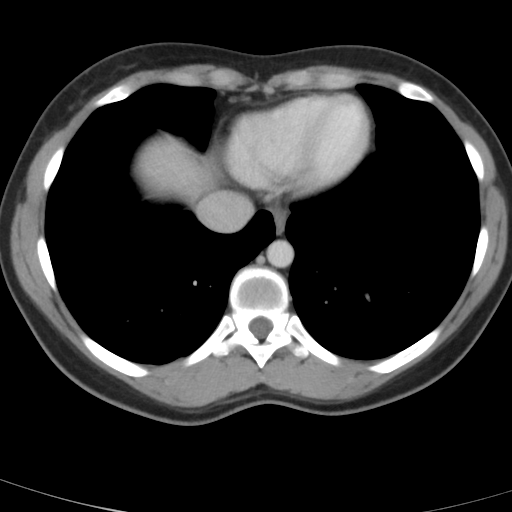

[Series 202: coronals, idose (2) · coronal · 0.45mm/px · 3 of 81 slices shown]
[im 27/81  soft-tissue]
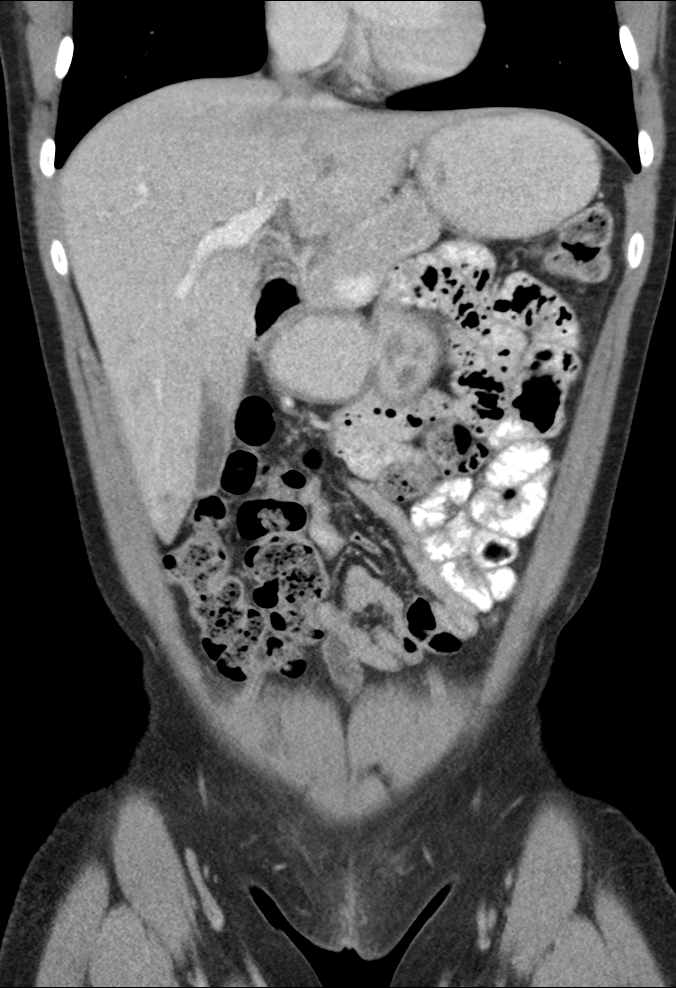
[im 36/81  soft-tissue]
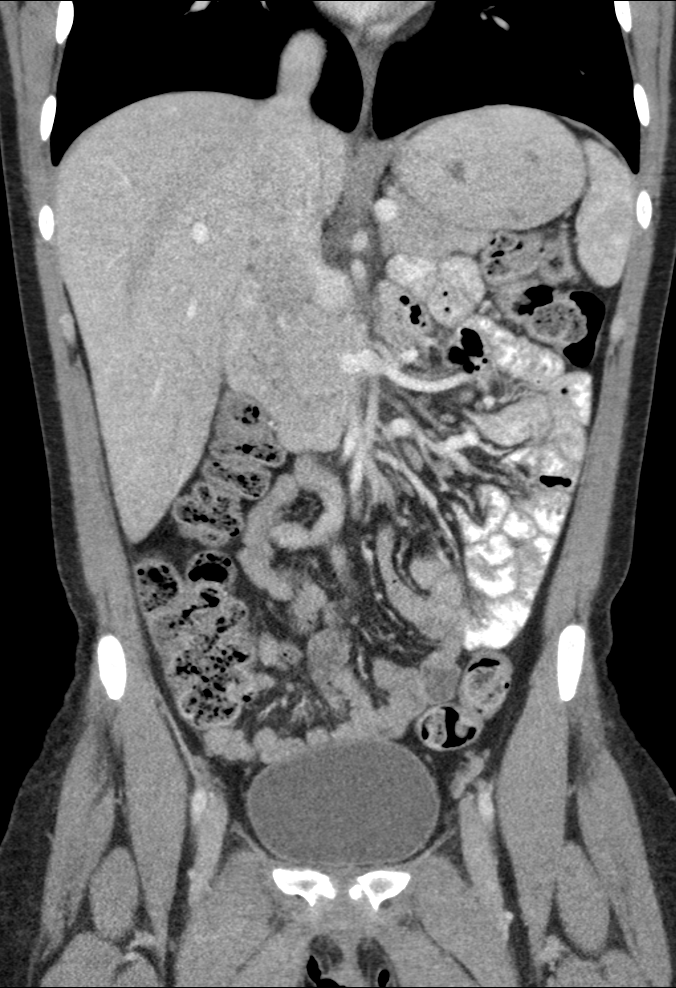
[im 45/81  soft-tissue]
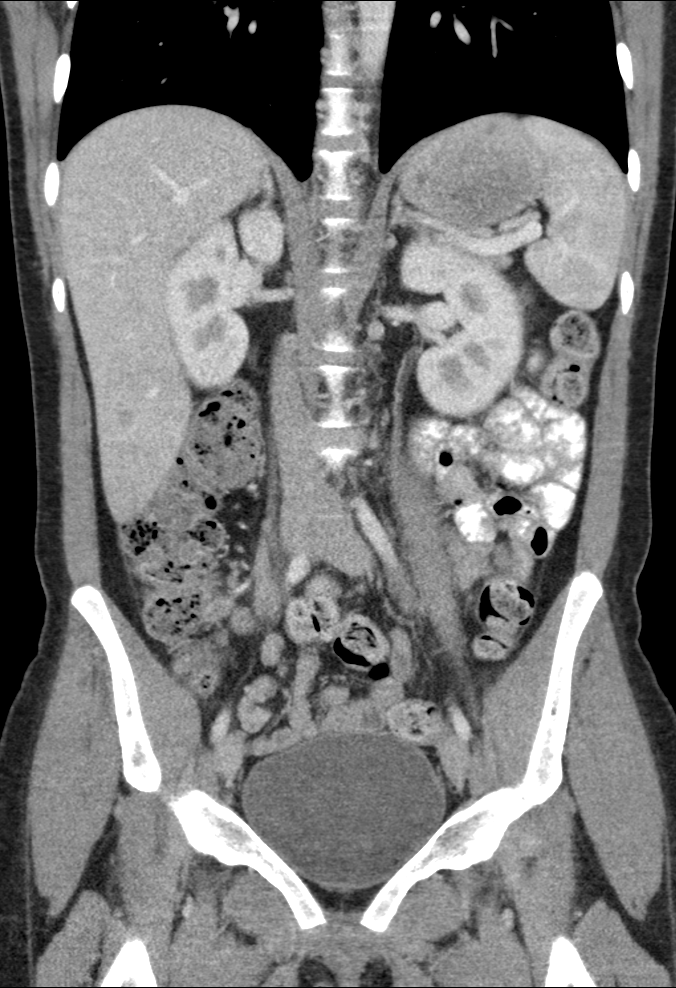

[13 of 46 positions shown; findings below may reference images not displayed]

FINDINGS: The lung bases are free of acute infiltrate or sizable effusion. The
liver, gallbladder, spleen, adrenal glands and pancreas are all
normal in their CT appearance. Stomach is distended with food
stuffs.

The kidneys are well visualized bilaterally and demonstrate a normal
enhancement pattern. No renal calculi or obstructive changes are
seen.

The appendix is within normal limits. The bladder is well distended.
No pelvic mass lesion or sidewall abnormality is noted. No acute
bony abnormality is seen.
IMPRESSION: No acute abnormality is identified.

## 2016-04-03 ENCOUNTER — Encounter: Payer: Self-pay | Admitting: *Deleted

## 2016-04-03 LAB — PROCEDURE REPORT - SCANNED: PAP SMEAR: ABNORMAL — AB

## 2016-05-02 ENCOUNTER — Encounter (HOSPITAL_COMMUNITY): Payer: Self-pay | Admitting: *Deleted

## 2016-05-02 ENCOUNTER — Inpatient Hospital Stay (HOSPITAL_COMMUNITY)
Admission: AD | Admit: 2016-05-02 | Discharge: 2016-05-02 | Disposition: A | Discharge status: Home / Self Care | Payer: Medicaid Other | Source: Ambulatory Visit | Attending: Obstetrics and Gynecology | Admitting: Obstetrics and Gynecology

## 2016-05-02 ENCOUNTER — Inpatient Hospital Stay (HOSPITAL_COMMUNITY): Payer: Medicaid Other

## 2016-05-02 DIAGNOSIS — O26892 Other specified pregnancy related conditions, second trimester: Secondary | ICD-10-CM

## 2016-05-02 DIAGNOSIS — Z7982 Long term (current) use of aspirin: Secondary | ICD-10-CM | POA: Insufficient documentation

## 2016-05-02 DIAGNOSIS — F17210 Nicotine dependence, cigarettes, uncomplicated: Secondary | ICD-10-CM | POA: Insufficient documentation

## 2016-05-02 DIAGNOSIS — O99331 Smoking (tobacco) complicating pregnancy, first trimester: Secondary | ICD-10-CM | POA: Diagnosis not present

## 2016-05-02 DIAGNOSIS — O2311 Infections of bladder in pregnancy, first trimester: Secondary | ICD-10-CM | POA: Insufficient documentation

## 2016-05-02 DIAGNOSIS — R109 Unspecified abdominal pain: Secondary | ICD-10-CM | POA: Diagnosis present

## 2016-05-02 DIAGNOSIS — R102 Pelvic and perineal pain: Secondary | ICD-10-CM | POA: Diagnosis not present

## 2016-05-02 DIAGNOSIS — Z3A01 Less than 8 weeks gestation of pregnancy: Secondary | ICD-10-CM | POA: Diagnosis not present

## 2016-05-02 DIAGNOSIS — O3481 Maternal care for other abnormalities of pelvic organs, first trimester: Secondary | ICD-10-CM | POA: Diagnosis not present

## 2016-05-02 DIAGNOSIS — N8311 Corpus luteum cyst of right ovary: Secondary | ICD-10-CM | POA: Diagnosis not present

## 2016-05-02 DIAGNOSIS — N3000 Acute cystitis without hematuria: Secondary | ICD-10-CM

## 2016-05-02 DIAGNOSIS — O26899 Other specified pregnancy related conditions, unspecified trimester: Secondary | ICD-10-CM

## 2016-05-02 DIAGNOSIS — N831 Corpus luteum cyst of ovary, unspecified side: Secondary | ICD-10-CM

## 2016-05-02 LAB — WET PREP, GENITAL
Sperm: NONE SEEN
Trich, Wet Prep: NONE SEEN
Yeast Wet Prep HPF POC: NONE SEEN

## 2016-05-02 LAB — URINALYSIS, ROUTINE W REFLEX MICROSCOPIC
Bilirubin Urine: NEGATIVE
Glucose, UA: NEGATIVE mg/dL
Hgb urine dipstick: NEGATIVE
KETONES UR: NEGATIVE mg/dL
LEUKOCYTES UA: NEGATIVE
NITRITE: POSITIVE — AB
PH: 7 (ref 5.0–8.0)
Protein, ur: NEGATIVE mg/dL
SPECIFIC GRAVITY, URINE: 1.025 (ref 1.005–1.030)

## 2016-05-02 LAB — CBC WITH DIFFERENTIAL/PLATELET
BASOS ABS: 0 10*3/uL (ref 0.0–0.1)
Basophils Relative: 0 %
EOS ABS: 0.3 10*3/uL (ref 0.0–0.7)
EOS PCT: 3 %
HCT: 32.4 % — ABNORMAL LOW (ref 36.0–46.0)
Hemoglobin: 11.4 g/dL — ABNORMAL LOW (ref 12.0–15.0)
LYMPHS PCT: 34 %
Lymphs Abs: 2.8 10*3/uL (ref 0.7–4.0)
MCH: 32.6 pg (ref 26.0–34.0)
MCHC: 35.2 g/dL (ref 30.0–36.0)
MCV: 92.6 fL (ref 78.0–100.0)
MONO ABS: 0.5 10*3/uL (ref 0.1–1.0)
Monocytes Relative: 6 %
Neutro Abs: 4.8 10*3/uL (ref 1.7–7.7)
Neutrophils Relative %: 57 %
PLATELETS: 386 10*3/uL (ref 150–400)
RBC: 3.5 MIL/uL — ABNORMAL LOW (ref 3.87–5.11)
RDW: 12.9 % (ref 11.5–15.5)
WBC: 8.5 10*3/uL (ref 4.0–10.5)

## 2016-05-02 LAB — URINE MICROSCOPIC-ADD ON

## 2016-05-02 LAB — POCT PREGNANCY, URINE: Preg Test, Ur: POSITIVE — AB

## 2016-05-02 LAB — HCG, QUANTITATIVE, PREGNANCY: HCG, BETA CHAIN, QUANT, S: 12215 m[IU]/mL — AB (ref <5)

## 2016-05-02 MED ORDER — TH PRENATAL VITAMINS 28-0.8 MG PO TABS: 1.0000 | ORAL_TABLET | Freq: Every day | ORAL | 2 refills | Status: DC

## 2016-05-02 MED ORDER — CEPHALEXIN 500 MG PO CAPS: 500.0000 mg | ORAL_CAPSULE | Freq: Four times a day (QID) | ORAL | 0 refills | Status: DC

## 2016-05-02 NOTE — Discharge Instructions (Signed)
Abdominal Pain During Pregnancy Abdominal pain is common in pregnancy. Most of the time, it does not cause harm. There are many causes of abdominal pain. Some causes are more serious than others. Some of the causes of abdominal pain in pregnancy are easily diagnosed. Occasionally, the diagnosis takes time to understand. Other times, the cause is not determined. Abdominal pain can be a sign that something is very wrong with the pregnancy, or the pain may have nothing to do with the pregnancy at all. For this reason, always tell your health care provider if you have any abdominal discomfort. HOME CARE INSTRUCTIONS  Monitor your abdominal pain for any changes. The following actions may help to alleviate any discomfort you are experiencing:  Do not have sexual intercourse or put anything in your vagina until your symptoms go away completely.  Get plenty of rest until your pain improves.  Drink clear fluids if you feel nauseous. Avoid solid food as long as you are uncomfortable or nauseous.  Only take over-the-counter or prescription medicine as directed by your health care provider.  Keep all follow-up appointments with your health care provider. SEEK IMMEDIATE MEDICAL CARE IF:  You are bleeding, leaking fluid, or passing tissue from the vagina.  You have increasing pain or cramping.  You have persistent vomiting.  You have painful or bloody urination.  You have a fever.  You notice a decrease in your baby's movements.  You have extreme weakness or feel faint.  You have shortness of breath, with or without abdominal pain.  You develop a severe headache with abdominal pain.  You have abnormal vaginal discharge with abdominal pain.  You have persistent diarrhea.  You have abdominal pain that continues even after rest, or gets worse. MAKE SURE YOU:   Understand these instructions.  Will watch your condition.  Will get help right away if you are not doing well or get worse.     This information is not intended to replace advice given to you by your health care provider. Make sure you discuss any questions you have with your health care provider.   Document Released: 06/26/2005 Document Revised: 04/16/2013 Document Reviewed: 01/23/2013 Elsevier Interactive Patient Education [employer]2016 Elsevier Inc.    Your ultrasound today shows that you have a 5 week 5 day pregnancy. You have a cyst on the right ovary. Start your prenatal vitamins and prenatal care. Return here as needed for any problems.

## 2016-05-02 NOTE — MAU Note (Signed)
Unsure about dates; LNMP was July 15; had irregular periodsin Aug and Sept; denies any bleeding; has thin white discharge but she states that is normal discharge; denies and vaginal bleeding but is having severe lower abdominal cramping;   G10 P6;

## 2016-05-02 NOTE — MAU Note (Addendum)
Pt C/O lower abd cramping x 1 week, is getting worse.  Denies bleeding. Pt had pos HPT yesterday.

## 2016-05-02 NOTE — MAU Provider Note (Signed)
[facility] AT [facility]    Provider Note   CSN: [id number] Arrival date & time: 05/02/16  0932     History   Chief Complaint Chief Complaint  Patient presents with  . Abdominal Pain    HPI Tonya David is a 30 y.o. [id number] @ [redacted]w[redacted]d gestation who presents to the MAU with abdominal pain that has been worsening over the past few days. She reports the pain as generalized and cramping. She has not started prenatal care.   Abdominal Pain  This is a new problem. The current episode started in the past 7 days. The onset quality is gradual. The problem has been gradually worsening. The pain is located in the generalized abdominal region. The pain is at a severity of 8/10. The quality of the pain is cramping. The abdominal pain does not radiate. Associated symptoms include nausea. Pertinent negatives include no constipation, diarrhea, dysuria, fever, frequency, headaches, vomiting or weight loss. Nothing aggravates the pain. The pain is relieved by nothing. She has tried nothing for the symptoms. Prior workup: positive pregnancy.    Past Medical History:  Diagnosis Date  . Anemia   . Headache(784.0)   . Preterm labor   . Urinary tract infection     Patient Active Problem List   Diagnosis Date Noted  . Active labor 04/09/2014  . NVD (normal vaginal delivery) 04/09/2014  . UTI (lower urinary tract infection) 12/08/2013  . Previous preterm delivery, antepartum 03/14/2012  . Cervical shortening complicating pregnancy 03/14/2012  . Supervision of high risk pregnancy in second trimester 03/14/2012    Past Surgical History:  Procedure Laterality Date  . THERAPEUTIC ABORTION      OB History    Gravida Para Term Preterm AB Living   10 6 2 4 3 6    SAB TAB Ectopic Multiple Live Births   1 2 0 0 6       Home Medications    Prior to Admission medications   Medication Sig Start Date End Date Taking? Authorizing Provider  aspirin-acetaminophen-caffeine (EXCEDRIN MIGRAINE)  [phone] MG tablet Take 2 tablets by mouth every 6 (six) hours as needed for headache.   Yes Historical Provider, MD  ibuprofen (ADVIL,MOTRIN) 800 MG tablet Take 800 mg by mouth every 8 (eight) hours as needed for headache, mild pain or moderate pain.   Yes Historical Provider, MD  cephALEXin (KEFLEX) 500 MG capsule Take 1 capsule (500 mg total) by mouth 4 (four) times daily. 05/02/16   Hope Orlene Och, NP  Prenatal Vit-Fe Fumarate-FA [facility] PRENATAL VITAMINS) 28-0.8 MG TABS Take 1 tablet by mouth daily. 05/02/16   Hope Orlene Och, NP    Family History Family History  Problem Relation Age of Onset  . Cancer Maternal Grandmother   . Cancer Maternal Grandfather   . Alcohol abuse Neg Hx   . Arthritis Neg Hx   . Asthma Neg Hx   . Birth defects Neg Hx   . COPD Neg Hx   . Depression Neg Hx   . Diabetes Neg Hx   . Drug abuse Neg Hx   . Early death Neg Hx   . Hearing loss Neg Hx   . Heart disease Neg Hx   . Hypertension Neg Hx   . Hyperlipidemia Neg Hx   . Kidney disease Neg Hx   . Learning disabilities Neg Hx   . Mental illness Neg Hx   . Mental retardation Neg Hx   . Miscarriages / Stillbirths Neg Hx   . Stroke  Neg Hx   . Vision loss Neg Hx   . Other Neg Hx     Social History Social History  Substance Use Topics  . Smoking status: Current Every Day Smoker    Packs/day: 0.50    Types: Cigarettes  . Smokeless tobacco: Current User  . Alcohol use Yes     Comment: occasionally     Allergies   Review of patient's allergies indicates no known allergies.   Review of Systems Review of Systems  Constitutional: Negative for chills, fever and weight loss.  HENT: Negative.   Eyes: Negative for visual disturbance.  Respiratory: Negative for cough, chest tightness and shortness of breath.   Cardiovascular: Negative for chest pain and leg swelling.  Gastrointestinal: Positive for abdominal pain and nausea. Negative for constipation, diarrhea and vomiting.  Genitourinary: Positive for  vaginal discharge. Negative for dysuria, frequency and vaginal bleeding.  Skin: Negative for rash.  Neurological: Negative for syncope, light-headedness and headaches.  Psychiatric/Behavioral: Negative for confusion. The patient is not nervous/anxious.      Physical Exam Updated Vital Signs BP 133/78 (BP Location: Right Arm)   Pulse 99   Temp 98.3 F (36.8 C) (Oral)   Resp 16   Ht 5\' 6"  (1.676 m)   Wt 147 lb (66.7 kg)   LMP 01/22/2016   BMI 23.73 kg/m   Physical Exam  Constitutional: She is oriented to person, place, and time. She appears well-developed and well-nourished. No distress.  HENT:  Head: Normocephalic and atraumatic.  Eyes: EOM are normal.  Neck: Neck supple.  Cardiovascular: Normal rate.   Pulmonary/Chest: Effort normal.  Abdominal: Soft. There is tenderness.  Genitourinary:  Genitourinary Comments: External genitalia without lesions, yellow d/c vaginal vault, no active bleeding, positive CMT, right adnexal tenderness, uterus minimal enlargement.   Musculoskeletal: Normal range of motion.  Neurological: She is alert and oriented to person, place, and time. No cranial nerve deficit.  Skin: Skin is warm and dry.  Psychiatric: She has a normal mood and affect. Her behavior is normal.  Nursing note and vitals reviewed.    ED Treatments / Results  Labs (all labs ordered are listed, but only abnormal results are displayed) Labs Reviewed  WET PREP, GENITAL - Abnormal; Notable for the following:       Result Value   Clue Cells Wet Prep HPF POC PRESENT (*)    WBC, Wet Prep HPF POC FEW (*)    All other components within normal limits  URINALYSIS, ROUTINE W REFLEX MICROSCOPIC (NOT AT [facility]) - Abnormal; Notable for the following:    Nitrite POSITIVE (*)    All other components within normal limits  URINE MICROSCOPIC-ADD ON - Abnormal; Notable for the following:    Squamous Epithelial / LPF 0-5 (*)    Bacteria, UA MANY (*)    All other components within normal  limits  HCG, QUANTITATIVE, PREGNANCY - Abnormal; Notable for the following:    hCG, Beta Chain, Quant, S 12,215 (*)    All other components within normal limits  CBC WITH DIFFERENTIAL/PLATELET - Abnormal; Notable for the following:    RBC 3.50 (*)    Hemoglobin 11.4 (*)    HCT 32.4 (*)    All other components within normal limits  POCT PREGNANCY, URINE - Abnormal; Notable for the following:    Preg Test, Ur POSITIVE (*)    All other components within normal limits  CULTURE, OB URINE  RPR  HIV ANTIBODY (ROUTINE TESTING)  GC/CHLAMYDIA PROBE AMP ([facility]) NOT AT [facility]   Radiology US Ob Comp Less 14 Wks  Result Date: 05/02/2016 CLINICAL DATA:  Pelvic pain, dating. EXAM: OBSTETRIC <14 WK [place] AND TRANSVAGINAL OB US TECHNIQUE: Both transabdominal and transvaginal ultrasound examinations were performed for complete evaluation of the gestation as well as the maternal uterus, adnexal regions, and pelvic cul-de-sac. Transvaginal technique was performed to assess early pregnancy. COMPARISON:  None FINDINGS: Intrauterine gestational sac: Visualized, single Yolk sac:  Visualized Embryo:  Visualized Cardiac Activity: Visualized Heart Rate: 108  bpm MSD:   mm    w     d CRL:  2.6  mm   5 w   5 d                  [place] EDC: 12/28/2016 Subchorionic hemorrhage:  None visualized. Maternal uterus/adnexae: Right corpus luteum cyst. No adnexal masses. Small free fluid in the pelvis. IMPRESSION: Five week 5 day intrauterine pregnancy. Fetal heart rate 108 beats per minute. No acute maternal findings. Electronically Signed   By: Charlett Nose M.D.   On: 05/02/2016 12:15   US Ob Transvaginal  Result Date: 05/02/2016 CLINICAL DATA:  Pelvic pain, dating. EXAM: OBSTETRIC <14 WK [place] AND TRANSVAGINAL OB US TECHNIQUE: Both transabdominal and transvaginal ultrasound examinations were performed for complete evaluation of the gestation as well as the maternal uterus, adnexal regions, and pelvic cul-de-sac. Transvaginal  technique was performed to assess early pregnancy. COMPARISON:  None FINDINGS: Intrauterine gestational sac: Visualized, single Yolk sac:  Visualized Embryo:  Visualized Cardiac Activity: Visualized Heart Rate: 108  bpm MSD:   mm    w     d CRL:  2.6  mm   5 w   5 d                  [place] EDC: 12/28/2016 Subchorionic hemorrhage:  None visualized. Maternal uterus/adnexae: Right corpus luteum cyst. No adnexal masses. Small free fluid in the pelvis. IMPRESSION: Five week 5 day intrauterine pregnancy. Fetal heart rate 108 beats per minute. No acute maternal findings. Electronically Signed   By: Charlett Nose M.D.   On: 05/02/2016 12:15    Procedures Procedures (including critical care time)  Medications Ordered in ED Medications - No data to display   Initial Impression / Assessment and Plan / ED Course  I have reviewed the triage vital signs and the nursing notes.  Pertinent labs & imaging results that were available during my care of the patient were reviewed by me and considered in my medical decision making (see chart for details).  Clinical Course    Final Clinical Impressions(s) / ED Diagnoses  30 y.o. [id number] @ 5 weeks 5 day gestation by ultrasound today stable for d/c without bleeding or acute abdomen. Will treat for UTI while culture pending. Discussed with the patient clinical, lab and ultrasound findings and plan of care and all questioned fully answered. She will start prenatal care and return here if any problems arise.  Final diagnoses:  Cystic corpus luteum  Acute cystitis without hematuria    New Prescriptions Current Discharge Medication List    START taking these medications   Details  cephALEXin (KEFLEX) 500 MG capsule Take 1 capsule (500 mg total) by mouth 4 (four) times daily. Qty: 20 capsule, Refills: 0    Prenatal Vit-Fe Fumarate-FA (TH PRENATAL VITAMINS) 28-0.8 MG TABS Take 1 tablet by mouth daily. Qty: 30 tablet, Refills: 2

## 2016-05-03 ENCOUNTER — Encounter: Payer: Self-pay | Admitting: Obstetrics and Gynecology

## 2016-05-03 ENCOUNTER — Other Ambulatory Visit: Payer: Self-pay | Admitting: Obstetrics and Gynecology

## 2016-05-03 DIAGNOSIS — Z3490 Encounter for supervision of normal pregnancy, unspecified, unspecified trimester: Secondary | ICD-10-CM

## 2016-05-03 LAB — RPR: RPR: NONREACTIVE

## 2016-05-03 LAB — GC/CHLAMYDIA PROBE AMP (~~LOC~~) NOT AT ARMC
CHLAMYDIA, DNA PROBE: NEGATIVE
Neisseria Gonorrhea: NEGATIVE

## 2016-05-03 LAB — HIV ANTIBODY (ROUTINE TESTING W REFLEX): HIV SCREEN 4TH GENERATION: NONREACTIVE

## 2016-05-03 NOTE — Addendum Note (Signed)
Addended by: Cheree DittoGRAHAM, Kadence Mimbs A on: 05/03/2016 10:52 AM   Modules accepted: Orders

## 2016-05-05 LAB — CULTURE, OB URINE: Culture: >=100000 — AB

## 2016-05-09 ENCOUNTER — Ambulatory Visit (HOSPITAL_COMMUNITY): Payer: 59 | Attending: Obstetrics and Gynecology

## 2016-05-17 ENCOUNTER — Other Ambulatory Visit: Payer: Self-pay | Admitting: Obstetrics and Gynecology

## 2016-05-17 ENCOUNTER — Ambulatory Visit (HOSPITAL_COMMUNITY)
Admission: RE | Admit: 2016-05-17 | Discharge: 2016-05-17 | Disposition: A | Discharge status: Home / Self Care | Payer: Medicaid Other | Source: Ambulatory Visit | Attending: Obstetrics and Gynecology | Admitting: Obstetrics and Gynecology

## 2016-05-17 DIAGNOSIS — Z3490 Encounter for supervision of normal pregnancy, unspecified, unspecified trimester: Secondary | ICD-10-CM

## 2016-05-17 DIAGNOSIS — Z3A08 8 weeks gestation of pregnancy: Secondary | ICD-10-CM | POA: Diagnosis not present

## 2016-05-17 DIAGNOSIS — Z362 Encounter for other antenatal screening follow-up: Secondary | ICD-10-CM | POA: Insufficient documentation

## 2016-05-17 DIAGNOSIS — O3680X0 Pregnancy with inconclusive fetal viability, not applicable or unspecified: Secondary | ICD-10-CM

## 2016-05-18 ENCOUNTER — Encounter (HOSPITAL_COMMUNITY): Payer: Self-pay | Admitting: *Deleted

## 2016-05-18 ENCOUNTER — Inpatient Hospital Stay (HOSPITAL_COMMUNITY): Payer: Medicaid Other

## 2016-05-18 ENCOUNTER — Inpatient Hospital Stay (HOSPITAL_COMMUNITY)
Admission: AD | Admit: 2016-05-18 | Discharge: 2016-05-18 | Disposition: A | Discharge status: Home / Self Care | Payer: Medicaid Other | Source: Ambulatory Visit | Attending: Obstetrics & Gynecology | Admitting: Obstetrics & Gynecology

## 2016-05-18 DIAGNOSIS — O99331 Smoking (tobacco) complicating pregnancy, first trimester: Secondary | ICD-10-CM | POA: Insufficient documentation

## 2016-05-18 DIAGNOSIS — O26891 Other specified pregnancy related conditions, first trimester: Secondary | ICD-10-CM | POA: Insufficient documentation

## 2016-05-18 DIAGNOSIS — Z3A08 8 weeks gestation of pregnancy: Secondary | ICD-10-CM | POA: Diagnosis not present

## 2016-05-18 DIAGNOSIS — R109 Unspecified abdominal pain: Secondary | ICD-10-CM | POA: Diagnosis present

## 2016-05-18 DIAGNOSIS — F17210 Nicotine dependence, cigarettes, uncomplicated: Secondary | ICD-10-CM | POA: Diagnosis not present

## 2016-05-18 DIAGNOSIS — Z3491 Encounter for supervision of normal pregnancy, unspecified, first trimester: Secondary | ICD-10-CM

## 2016-05-18 LAB — CBC
HEMATOCRIT: 32.7 % — AB (ref 36.0–46.0)
HEMOGLOBIN: 11.5 g/dL — AB (ref 12.0–15.0)
MCH: 33 pg (ref 26.0–34.0)
MCHC: 35.2 g/dL (ref 30.0–36.0)
MCV: 93.7 fL (ref 78.0–100.0)
Platelets: 314 10*3/uL (ref 150–400)
RBC: 3.49 MIL/uL — AB (ref 3.87–5.11)
RDW: 12.8 % (ref 11.5–15.5)
WBC: 8.4 10*3/uL (ref 4.0–10.5)

## 2016-05-18 LAB — URINALYSIS, ROUTINE W REFLEX MICROSCOPIC
BILIRUBIN URINE: NEGATIVE
Glucose, UA: NEGATIVE mg/dL
Hgb urine dipstick: NEGATIVE
KETONES UR: 15 mg/dL — AB
Leukocytes, UA: NEGATIVE
NITRITE: NEGATIVE
PH: 6.5 (ref 5.0–8.0)
PROTEIN: NEGATIVE mg/dL
Specific Gravity, Urine: 1.02 (ref 1.005–1.030)

## 2016-05-18 MED ORDER — CYCLOBENZAPRINE HCL 10 MG PO TABS
10.0000 mg | ORAL_TABLET | Freq: Three times a day (TID) | ORAL | Status: DC | PRN
  Administered 2016-05-18: 10 mg via ORAL
  Filled 2016-05-18: qty 1

## 2016-05-18 MED ORDER — CYCLOBENZAPRINE HCL 10 MG PO TABS: 10.0000 mg | ORAL_TABLET | Freq: Three times a day (TID) | ORAL | 0 refills | Status: DC | PRN

## 2016-05-18 NOTE — Discharge Instructions (Signed)
Flank Pain °Flank pain refers to pain that is located on the side of the body between the upper abdomen and the back. The pain may occur over a short period of time (acute) or may be long-term or reoccurring (chronic). It may be mild or severe. Flank pain can be caused by many things. °CAUSES  °Some of the more common causes of flank pain include: °· Muscle strains.   °· Muscle spasms.   °· A disease of your spine (vertebral disk disease).   °· A lung infection (pneumonia).   °· Fluid around your lungs (pulmonary edema).   °· A kidney infection.   °· Kidney stones.   °· A very painful skin rash caused by the chickenpox virus (shingles).   °· Gallbladder disease.   °HOME CARE INSTRUCTIONS  °Home care will depend on the cause of your pain. In general, °· Rest as directed by your caregiver. °· Drink enough fluids to keep your urine clear or pale yellow. °· Only take over-the-counter or prescription medicines as directed by your caregiver. Some medicines may help relieve the pain. °· Tell your caregiver about any changes in your pain. °· Follow up with your caregiver as directed. °SEEK IMMEDIATE MEDICAL CARE IF:  °· Your pain is not controlled with medicine.   °· You have new or worsening symptoms. °· Your pain increases.   °· You have abdominal pain.   °· You have shortness of breath.   °· You have persistent nausea or vomiting.   °· You have swelling in your abdomen.   °· You feel faint or pass out.   °· You have blood in your urine. °· You have a fever or persistent symptoms for more than 2-3 days. °· You have a fever and your symptoms suddenly get worse. °MAKE SURE YOU:  °· Understand these instructions. °· Will watch your condition. °· Will get help right away if you are not doing well or get worse. °  °This information is not intended to replace advice given to you by your health care provider. Make sure you discuss any questions you have with your health care provider. °  °Document Released: 08/17/2005 Document  Revised: 03/20/2012 Document Reviewed: 02/08/2012 °Elsevier Interactive Patient Education ©2016 Elsevier Inc. ° °

## 2016-05-18 NOTE — MAU Note (Signed)
Pt reports she started having pain in her left flank area 2 days ago. The pain has gotten much worse today.

## 2016-05-18 NOTE — MAU Provider Note (Signed)
History     CSN: [id number]  Arrival date and time: 05/18/16 1324   None     Chief Complaint  Patient presents with  . Flank Pain   [id number] @8  weeks here with left flank pain. She reports pain started 2 days ago and worsened today. She describes pain as constant and sharp. Movement and inhalation make pain worse. She took Tylenol yesterday and had no relief. She denies fever, back pain, hematuria, dysuria, urgency, and polyuria. She denies any recent injuries. She denies vaginal bleeding and pelvic pain.     OB History    Gravida Para Term Preterm AB Living   10 6 2 4 3 6    SAB TAB Ectopic Multiple Live Births   1 2 0 0 6      Past Medical History:  Diagnosis Date  . Anemia   . Headache(784.0)   . Preterm labor   . Urinary tract infection     Past Surgical History:  Procedure Laterality Date  . THERAPEUTIC ABORTION      Family History  Problem Relation Age of Onset  . Cancer Maternal Grandmother   . Cancer Maternal Grandfather   . Alcohol abuse Neg Hx   . Arthritis Neg Hx   . Asthma Neg Hx   . Birth defects Neg Hx   . COPD Neg Hx   . Depression Neg Hx   . Diabetes Neg Hx   . Drug abuse Neg Hx   . Early death Neg Hx   . Hearing loss Neg Hx   . Heart disease Neg Hx   . Hypertension Neg Hx   . Hyperlipidemia Neg Hx   . Kidney disease Neg Hx   . Learning disabilities Neg Hx   . Mental illness Neg Hx   . Mental retardation Neg Hx   . Miscarriages / Stillbirths Neg Hx   . Stroke Neg Hx   . Vision loss Neg Hx   . Other Neg Hx     Social History  Substance Use Topics  . Smoking status: Current Every Day Smoker    Packs/day: 0.50    Types: Cigarettes  . Smokeless tobacco: Current User  . Alcohol use Yes     Comment: occasionally    Allergies: No Known Allergies  Prescriptions Prior to Admission  Medication Sig Dispense Refill Last Dose  . cephALEXin (KEFLEX) 500 MG capsule Take 1 capsule (500 mg total) by mouth 4 (four) times daily. 20 capsule 0    . Prenatal Vit-Fe Fumarate-FA (TH PRENATAL VITAMINS) 28-0.8 MG TABS Take 1 tablet by mouth daily. 30 tablet 2     Review of Systems  Constitutional: Negative.   Gastrointestinal: Negative.   Genitourinary: Positive for flank pain. Negative for dysuria, frequency, hematuria and urgency.   Physical Exam   Blood pressure 118/65, pulse 97, temperature 98.5 F (36.9 C), temperature source Oral, resp. rate 18, weight 69.6 kg (153 lb 6.4 oz), last menstrual period 01/22/2016.  Physical Exam  Constitutional: She is oriented to person, place, and time. She appears well-developed and well-nourished. No distress.  HENT:  Head: Normocephalic and atraumatic.  Neck: Normal range of motion.  Cardiovascular: Normal rate.   Respiratory: Effort normal.  GI: Soft. Normal appearance. She exhibits no distension and no mass. There is no hepatosplenomegaly. There is tenderness (left flank  ). There is no rigidity, no rebound, no guarding and no CVA tenderness.    Musculoskeletal: Normal range of motion.  Neurological: She is alert and oriented  to person, place, and time.  Skin: Skin is warm and dry.  Psychiatric: She has a normal mood and affect.   Results for orders placed or performed during the hospital encounter of 05/18/16 (from the past 24 hour(s))  Urinalysis, Routine w reflex microscopic ([facility])     Status: Abnormal   Collection Time: 05/18/16  1:33 PM  Result Value Ref Range   Color, Urine YELLOW YELLOW   APPearance CLOUDY (A) CLEAR   Specific Gravity, Urine 1.020 1.005 - 1.030   pH 6.5 5.0 - 8.0   Glucose, UA NEGATIVE NEGATIVE mg/dL   Hgb urine dipstick NEGATIVE NEGATIVE   Bilirubin Urine NEGATIVE NEGATIVE   Ketones, ur 15 (A) NEGATIVE mg/dL   Protein, ur NEGATIVE NEGATIVE mg/dL   Nitrite NEGATIVE NEGATIVE   Leukocytes, UA NEGATIVE NEGATIVE  CBC     Status: Abnormal   Collection Time: 05/18/16  3:39 PM  Result Value Ref Range   WBC 8.4 4.0 - 10.5 K/uL   RBC 3.49 (L) 3.87 -  5.11 MIL/uL   Hemoglobin 11.5 (L) 12.0 - 15.0 g/dL   HCT 40.932.7 (L) 81.136.0 - 91.446.0 %   MCV 93.7 78.0 - 100.0 fL   MCH 33.0 26.0 - 34.0 pg   MCHC 35.2 30.0 - 36.0 g/dL   RDW 78.212.8 95.611.5 - 21.315.5 %   Platelets 314 150 - 400 K/uL   [place] Renal  Result Date: 05/18/2016 CLINICAL DATA:  Left flank pain for 2 days, the patient is [redacted] weeks pregnant EXAM: RENAL / URINARY TRACT ULTRASOUND COMPLETE COMPARISON:  CT abdomen pelvis of 09/01/2014 FINDINGS: Right Kidney: Length: 11.0 cm.  No hydronephrosis is seen. Left Kidney: Length: 11.0 cm.  No hydronephrosis is noted. Bladder: The urinary bladder is not well distended but no gross abnormality is seen. IMPRESSION: No hydronephrosis. Electronically Signed   By: Dwyane DeePaul  Barry M.D.   On: 05/18/2016 16:25    MAU Course  Procedures Flexeril 10 mg po MDM Labs and US ordered and reviewed. No evidence of pyelo or urinary stones. Pain likely MSK. Stable for discharge home.   Assessment and Plan   1. Flank pain    Discharge home Follow up with OBGYN provider of choice in 2 weeks Heating pad prn Tylenol 1000 mg po q6 hrs prn    Medication List    STOP taking these medications   cephALEXin 500 MG capsule Commonly known as:  KEFLEX     TAKE these medications   acetaminophen 500 MG tablet Commonly known as:  TYLENOL Take 500 mg by mouth every 6 (six) hours as needed for moderate pain.   cyclobenzaprine 10 MG tablet Commonly known as:  FLEXERIL Take 1 tablet (10 mg total) by mouth 3 (three) times daily as needed for muscle spasms.   TH PRENATAL VITAMINS 28-0.8 MG Tabs Take 1 tablet by mouth daily.      Donette LarryMelanie Amar Sippel, CNM 05/18/2016, 3:31 PM

## 2016-07-10 NOTE — L&D Delivery Note (Signed)
Delivery Note Pt had SROM @ 0811 for clear fluid and immediately felt pressure to push. At [phone] a viable female was delivered via SVD (Presentation: ROA).  APGAR:8 ,9 ; weight: pending. Nuchal cord x 1 reduced after delivery. Infant w/ spont cry.  Cord clamped and cut by FOB after 1min delay prior to being taken to the warmer for eval by peds. Placenta status: spont ,intact .  Cord: 3 vessel  Anesthesia:  None Episiotomy:  None Lacerations:  None Est. Blood Loss (mL):  150  Mom to postpartum.  Baby to Couplet care / Skin to Skin.  Pt desires BTL- will plan for that later today.  Cam HaiSHAW, KIMBERLY CNM 11/23/2016, 8:26 AM

## 2016-07-13 ENCOUNTER — Inpatient Hospital Stay (HOSPITAL_COMMUNITY)
Admission: AD | Admit: 2016-07-13 | Discharge: 2016-07-14 | Disposition: A | Discharge status: Home / Self Care | Payer: Medicaid Other | Source: Ambulatory Visit | Attending: Obstetrics & Gynecology | Admitting: Obstetrics & Gynecology

## 2016-07-13 ENCOUNTER — Encounter (HOSPITAL_COMMUNITY): Payer: Self-pay

## 2016-07-13 DIAGNOSIS — O99332 Smoking (tobacco) complicating pregnancy, second trimester: Secondary | ICD-10-CM | POA: Diagnosis not present

## 2016-07-13 DIAGNOSIS — Z3A17 17 weeks gestation of pregnancy: Secondary | ICD-10-CM | POA: Insufficient documentation

## 2016-07-13 DIAGNOSIS — O2342 Unspecified infection of urinary tract in pregnancy, second trimester: Secondary | ICD-10-CM | POA: Diagnosis not present

## 2016-07-13 DIAGNOSIS — Z79899 Other long term (current) drug therapy: Secondary | ICD-10-CM | POA: Diagnosis not present

## 2016-07-13 DIAGNOSIS — R102 Pelvic and perineal pain: Secondary | ICD-10-CM | POA: Diagnosis present

## 2016-07-13 LAB — URINALYSIS, ROUTINE W REFLEX MICROSCOPIC
Bilirubin Urine: NEGATIVE
GLUCOSE, UA: NEGATIVE mg/dL
Hgb urine dipstick: NEGATIVE
Ketones, ur: NEGATIVE mg/dL
NITRITE: POSITIVE — AB
PH: 6 (ref 5.0–8.0)
PROTEIN: NEGATIVE mg/dL
SPECIFIC GRAVITY, URINE: 1.018 (ref 1.005–1.030)

## 2016-07-13 LAB — WET PREP, GENITAL
SPERM: NONE SEEN
Trich, Wet Prep: NONE SEEN
WBC WET PREP: NONE SEEN
Yeast Wet Prep HPF POC: NONE SEEN

## 2016-07-13 MED ORDER — PROMETHAZINE HCL 25 MG/ML IJ SOLN
25.0000 mg | Freq: Once | INTRAMUSCULAR | Status: AC
  Administered 2016-07-13: 25 mg via INTRAMUSCULAR
  Filled 2016-07-13: qty 1

## 2016-07-13 NOTE — MAU Provider Note (Signed)
History     CSN: [id number]  Arrival date and time: 07/13/16 2111   First Provider Initiated Contact with Patient 07/13/16 2237      Chief Complaint  Patient presents with  . Pelvic Pain  . Morning Sickness   Pelvic Pain  The patient's primary symptoms include pelvic pain. The patient's pertinent negatives include no vaginal discharge. This is a new problem. Episode onset: about 3 days ago.  The problem occurs intermittently. The problem has been unchanged. Pain severity now: 7/10  The problem affects both sides. She is pregnant. Associated symptoms include nausea and vomiting. Pertinent negatives include no constipation, diarrhea, dysuria, fever, frequency or urgency. The vaginal discharge was normal (but seems "like a lot"). There has been no bleeding. Nothing aggravates the symptoms. She has tried acetaminophen for the symptoms. The treatment provided mild relief. Her menstrual history has been regular (LMP 01/22/16 ).    Past Medical History:  Diagnosis Date  . Anemia   . Headache(784.0)   . Preterm labor   . Urinary tract infection     Past Surgical History:  Procedure Laterality Date  . THERAPEUTIC ABORTION      Family History  Problem Relation Age of Onset  . Cancer Maternal Grandmother   . Cancer Maternal Grandfather   . Alcohol abuse Neg Hx   . Arthritis Neg Hx   . Asthma Neg Hx   . Birth defects Neg Hx   . COPD Neg Hx   . Depression Neg Hx   . Diabetes Neg Hx   . Drug abuse Neg Hx   . Early death Neg Hx   . Hearing loss Neg Hx   . Heart disease Neg Hx   . Hypertension Neg Hx   . Hyperlipidemia Neg Hx   . Kidney disease Neg Hx   . Learning disabilities Neg Hx   . Mental illness Neg Hx   . Mental retardation Neg Hx   . Miscarriages / Stillbirths Neg Hx   . Stroke Neg Hx   . Vision loss Neg Hx   . Other Neg Hx     Social History  Substance Use Topics  . Smoking status: Current Every Day Smoker    Packs/day: 0.25    Types: Cigarettes  . Smokeless  tobacco: Current User  . Alcohol use Yes     Comment: occasionally    Allergies: No Known Allergies  Prescriptions Prior to Admission  Medication Sig Dispense Refill Last Dose  . acetaminophen (TYLENOL) 500 MG tablet Take 1,000 mg by mouth every 6 (six) hours as needed for moderate pain.    07/12/2016 at Unknown time  . buprenorphine (SUBUTEX) 8 MG SUBL SL tablet Place 8 mg under the tongue 2 (two) times daily.  0 07/13/2016 at Unknown time  . cyclobenzaprine (FLEXERIL) 10 MG tablet Take 1 tablet (10 mg total) by mouth 3 (three) times daily as needed for muscle spasms. (Patient not taking: Reported on 07/13/2016) 30 tablet 0 Not Taking at Unknown time  . Prenatal Vit-Fe Fumarate-FA (TH PRENATAL VITAMINS) 28-0.8 MG TABS Take 1 tablet by mouth daily. (Patient not taking: Reported on 07/13/2016) 30 tablet 2 Not Taking at Unknown time    Review of Systems  Constitutional: Negative for fever.  Gastrointestinal: Positive for nausea and vomiting. Negative for constipation and diarrhea.  Genitourinary: Positive for pelvic pain. Negative for dysuria, frequency, urgency, vaginal bleeding and vaginal discharge.   Physical Exam   Blood pressure 124/69, pulse 101, temperature 97.9 F (36.6  C), temperature source Oral, resp. rate 18, last menstrual period 01/22/2016.  Physical Exam  Nursing note and vitals reviewed. Constitutional: She is oriented to person, place, and time. She appears well-developed and well-nourished. No distress.  HENT:  Head: Normocephalic.  Cardiovascular: Normal rate.   Respiratory: Effort normal.  GI: Soft. There is no tenderness. There is no rebound.  Neurological: She is alert and oriented to person, place, and time.  Skin: Skin is warm and dry.  Psychiatric: She has a normal mood and affect.   Results for orders placed or performed during the hospital encounter of 07/13/16 (from the past 24 hour(s))  Urinalysis, Routine w reflex microscopic     Status: Abnormal    Collection Time: 07/13/16  9:26 PM  Result Value Ref Range   Color, Urine YELLOW YELLOW   APPearance HAZY (A) CLEAR   Specific Gravity, Urine 1.018 1.005 - 1.030   pH 6.0 5.0 - 8.0   Glucose, UA NEGATIVE NEGATIVE mg/dL   Hgb urine dipstick NEGATIVE NEGATIVE   Bilirubin Urine NEGATIVE NEGATIVE   Ketones, ur NEGATIVE NEGATIVE mg/dL   Protein, ur NEGATIVE NEGATIVE mg/dL   Nitrite POSITIVE (A) NEGATIVE   Leukocytes, UA TRACE (A) NEGATIVE   RBC / HPF 0-5 0 - 5 RBC/hpf   WBC, UA 0-5 0 - 5 WBC/hpf   Bacteria, UA MANY (A) NONE SEEN   Squamous Epithelial / LPF 0-5 (A) NONE SEEN   Mucous PRESENT   Wet prep, genital     Status: Abnormal   Collection Time: 07/13/16 10:55 PM  Result Value Ref Range   Yeast Wet Prep HPF POC NONE SEEN NONE SEEN   Trich, Wet Prep NONE SEEN NONE SEEN   Clue Cells Wet Prep HPF POC PRESENT (A) NONE SEEN   WBC, Wet Prep HPF POC NONE SEEN NONE SEEN   Sperm NONE SEEN     MAU Course  Procedures  MDM Patient has had phenergan here. She is tolerating PO  Assessment and Plan   1. UTI in pregnancy, antepartum, second trimester   2. [redacted] weeks gestation of pregnancy    DC home Comfort measures reviewed  2nd Trimester precautions  Fetal kick counts RX: phenergan PRN, Macrobid BID  Return to MAU as needed FU with OB as planned  Follow-up Information    [facility] Follow up.   Specialty:  Obstetrics and Gynecology Contact information: [address], [address] [place] [address] [phone]           [name] 07/13/2016, 10:49 PM

## 2016-07-13 NOTE — MAU Note (Signed)
Pt presents to MAU with cramping and back pain that started 3 days ago. Reports increased discharge that is a creme color but, doesn't have an odor. Denies bleeding or leaking of fluid. Also reports nausea for the last week.

## 2016-07-14 DIAGNOSIS — O2342 Unspecified infection of urinary tract in pregnancy, second trimester: Secondary | ICD-10-CM | POA: Diagnosis not present

## 2016-07-14 MED ORDER — NITROFURANTOIN MONOHYD MACRO 100 MG PO CAPS: 100.0000 mg | ORAL_CAPSULE | Freq: Two times a day (BID) | ORAL | 0 refills | Status: DC

## 2016-07-14 MED ORDER — PROMETHAZINE HCL 25 MG PO TABS: 12.5000 mg | ORAL_TABLET | Freq: Four times a day (QID) | ORAL | 0 refills | Status: DC | PRN

## 2016-07-14 MED ORDER — PRENATAL 27-0.8 MG PO TABS: 1.0000 | ORAL_TABLET | Freq: Every day | ORAL | 9 refills | Status: DC

## 2016-07-14 NOTE — Discharge Instructions (Signed)
Prenatal Care Providers °[facility]    [facility] ° Phone- [phone]     Phone: [phone] °         °[facility]                      [facility] ° @[place]     Phone: [phone] ° Phone: [phone] °        [facility] °[facility]     Phone: [phone] ° Phone: [phone]   °        [facility] °[facility] [place]                hone: [phone] ° Phone: [phone] °        [facility] °Dr. Bernard Marshall      Phone: [phone] ° Phone: [phone] °        [facility] °[facility]                Phone: [phone] ° [facility]  ° Phone:[phone]    [facility] ([place]) °         Phone: [phone] °[facility] °  Phone: [phone] °

## 2016-07-16 LAB — CULTURE, OB URINE: Culture: >=100000 — AB

## 2016-07-17 LAB — GC/CHLAMYDIA PROBE AMP (~~LOC~~) NOT AT ARMC
CHLAMYDIA, DNA PROBE: NEGATIVE
NEISSERIA GONORRHEA: NEGATIVE

## 2016-08-02 ENCOUNTER — Ambulatory Visit (INDEPENDENT_AMBULATORY_CARE_PROVIDER_SITE_OTHER): Payer: Medicaid Other | Admitting: Obstetrics & Gynecology

## 2016-08-02 ENCOUNTER — Encounter: Payer: Self-pay | Admitting: Obstetrics & Gynecology

## 2016-08-02 VITALS — BP 113/72 | HR 99 | Temp 98.2°F | Wt 154.9 lb

## 2016-08-02 DIAGNOSIS — R87610 Atypical squamous cells of undetermined significance on cytologic smear of cervix (ASC-US): Secondary | ICD-10-CM | POA: Insufficient documentation

## 2016-08-02 DIAGNOSIS — O0992 Supervision of high risk pregnancy, unspecified, second trimester: Secondary | ICD-10-CM

## 2016-08-02 DIAGNOSIS — R87810 Cervical high risk human papillomavirus (HPV) DNA test positive: Secondary | ICD-10-CM

## 2016-08-02 DIAGNOSIS — Z641 Problems related to multiparity: Secondary | ICD-10-CM

## 2016-08-02 NOTE — Progress Notes (Signed)
Subjective:    Tonya David is a [id number] 3855w6d being seen today for her first obstetrical visit.  Her obstetrical history is significant for grand multipara and h/o several [redacted] week EGA deliveries. Patient does not intend to breast feed. Pregnancy history fully reviewed.  Patient reports no complaints.  Vitals:   08/02/16 1312  BP: 113/72  Pulse: 99  Temp: 98.2 F (36.8 C)  Weight: 154 lb 14.4 oz (70.3 kg)    HISTORY: OB History  Gravida Para Term Preterm AB Living  10 6 2 4 3 6   SAB TAB Ectopic Multiple Live Births  1 2 0 0 6    # Outcome Date GA Lbr Len/2nd Weight Sex Delivery Anes PTL Lv  10 Current           9 Preterm 04/09/14 [redacted]w[redacted]d 01:00 / 00:05 5 lb 8.7 oz (2.515 kg) F Vag-Spont EPI  LIV  8 Term 06/14/12 126w0d 04:10 / 00:12 6 lb 6.8 oz (2.914 kg) F Vag-Spont EPI  LIV  7 Preterm 2011 7660w0d   F Vag-Spont  Y LIV  6 Preterm 2009 4760w0d   F Vag-Spont  Y LIV  5 Preterm 05/18/07 7160w0d   M Vag-Spont   LIV  4 Term 2007 8526w0d   M Vag-Spont  N LIV  3 SAB              Birth Comments: System Generated. Please review and update pregnancy details.  2 TAB           1 TAB              Past Medical History:  Diagnosis Date  . Anemia   . Headache(784.0)   . Preterm labor   . Urinary tract infection    Past Surgical History:  Procedure Laterality Date  . THERAPEUTIC ABORTION     Family History  Problem Relation Age of Onset  . Cancer Maternal Grandmother   . Cancer Maternal Grandfather   . Alcohol abuse Neg Hx   . Arthritis Neg Hx   . Asthma Neg Hx   . Birth defects Neg Hx   . COPD Neg Hx   . Depression Neg Hx   . Diabetes Neg Hx   . Drug abuse Neg Hx   . Early death Neg Hx   . Hearing loss Neg Hx   . Heart disease Neg Hx   . Hypertension Neg Hx   . Hyperlipidemia Neg Hx   . Kidney disease Neg Hx   . Learning disabilities Neg Hx   . Mental illness Neg Hx   . Mental retardation Neg Hx   . Miscarriages / Stillbirths Neg Hx   . Stroke Neg Hx   . Vision loss  Neg Hx   . Other Neg Hx      Exam    Uterus:     Pelvic Exam:    Perineum: No Hemorrhoids   Vulva: normal   Vagina:  normal mucosa   pH:    Cervix: not examined, getting colpo at next visit   Adnexa: not evaluated   Bony Pelvis: not evaluated  System: Breast:  normal appearance, no masses or tenderness   Skin: normal coloration and turgor, no rashes    Neurologic: oriented   Extremities: normal strength, tone, and muscle mass   HEENT PERRLA   Mouth/Teeth mucous membranes moist, pharynx normal without lesions   Neck supple   Cardiovascular: regular rate and rhythm   Respiratory:  appears well, vitals  normal, no respiratory distress, acyanotic, normal RR, ear and throat exam is normal, neck free of mass or lymphadenopathy, chest clear, no wheezing, crepitations, rhonchi, normal symmetric air entry   Abdomen: soft, non-tender; bowel sounds normal; no masses,  no organomegaly   Urinary: urethral meatus normal      Assessment:    Pregnancy: [id number] Patient Active Problem List   Diagnosis Date Noted  . ASCUS with positive high risk HPV cervical 08/02/2016  . UTI in pregnancy, antepartum, first trimester 12/08/2013  . Previous preterm delivery, antepartum 03/14/2012  . Cervical shortening complicating pregnancy 03/14/2012  . Supervision of high risk pregnancy in second trimester 03/14/2012        Plan:     Initial labs drawn. Prenatal vitamins. Problem list reviewed and updated. Genetic Screening discussed Quad Screen: ordered.  Ultrasound discussed; fetal survey: ordered.  Follow up in 4 weeks for colpo and weekly for 17-P. She declines a flu vaccine today. She does want 17-P. Tonya David 08/02/2016

## 2016-08-04 ENCOUNTER — Other Ambulatory Visit: Payer: Self-pay | Admitting: Obstetrics & Gynecology

## 2016-08-04 ENCOUNTER — Ambulatory Visit (HOSPITAL_COMMUNITY)
Admission: RE | Admit: 2016-08-04 | Discharge: 2016-08-04 | Disposition: A | Discharge status: Home / Self Care | Payer: Medicaid Other | Source: Ambulatory Visit | Attending: Obstetrics & Gynecology | Admitting: Obstetrics & Gynecology

## 2016-08-04 ENCOUNTER — Encounter (HOSPITAL_COMMUNITY): Payer: Self-pay

## 2016-08-04 DIAGNOSIS — R87610 Atypical squamous cells of undetermined significance on cytologic smear of cervix (ASC-US): Secondary | ICD-10-CM

## 2016-08-04 DIAGNOSIS — O99332 Smoking (tobacco) complicating pregnancy, second trimester: Secondary | ICD-10-CM | POA: Insufficient documentation

## 2016-08-04 DIAGNOSIS — F1120 Opioid dependence, uncomplicated: Secondary | ICD-10-CM | POA: Diagnosis not present

## 2016-08-04 DIAGNOSIS — Z3A19 19 weeks gestation of pregnancy: Secondary | ICD-10-CM

## 2016-08-04 DIAGNOSIS — Z363 Encounter for antenatal screening for malformations: Secondary | ICD-10-CM | POA: Diagnosis present

## 2016-08-04 DIAGNOSIS — O0992 Supervision of high risk pregnancy, unspecified, second trimester: Secondary | ICD-10-CM

## 2016-08-04 DIAGNOSIS — O99320 Drug use complicating pregnancy, unspecified trimester: Secondary | ICD-10-CM | POA: Insufficient documentation

## 2016-08-04 DIAGNOSIS — O0932 Supervision of pregnancy with insufficient antenatal care, second trimester: Secondary | ICD-10-CM | POA: Diagnosis not present

## 2016-08-04 DIAGNOSIS — R87810 Cervical high risk human papillomavirus (HPV) DNA test positive: Secondary | ICD-10-CM

## 2016-08-04 DIAGNOSIS — O09219 Supervision of pregnancy with history of pre-term labor, unspecified trimester: Secondary | ICD-10-CM | POA: Insufficient documentation

## 2016-08-05 LAB — URINE CULTURE, OB REFLEX

## 2016-08-05 LAB — CULTURE, OB URINE

## 2016-08-07 ENCOUNTER — Other Ambulatory Visit (HOSPITAL_COMMUNITY): Payer: Self-pay | Admitting: *Deleted

## 2016-08-07 DIAGNOSIS — O99320 Drug use complicating pregnancy, unspecified trimester: Principal | ICD-10-CM

## 2016-08-07 DIAGNOSIS — F1120 Opioid dependence, uncomplicated: Secondary | ICD-10-CM

## 2016-08-07 LAB — OBSTETRIC PANEL, INCLUDING HIV
Antibody Screen: NEGATIVE
HEP B S AG: NEGATIVE
HIV SCREEN 4TH GENERATION: NONREACTIVE
RH TYPE: POSITIVE
RPR: NONREACTIVE
RUBELLA: 4.3 {index} (ref >0.99)

## 2016-08-07 LAB — HEMOGLOBINOPATHY EVALUATION
HEMOGLOBIN A2 QUANTITATION: 2.5 % (ref 1.8–3.2)
HGB A: 97.5 % (ref 96.4–98.8)
HGB C: 0 %
HGB S: 0 %
Hemoglobin F Quantitation: 0 % (ref 0.0–2.0)

## 2016-08-07 LAB — VARICELLA ZOSTER ANTIBODY, IGG: Varicella zoster IgG: 2035 index (ref >165)

## 2016-08-08 LAB — AFP, QUAD SCREEN
DIA Mom Value: 2.65
DIA VALUE (EIA): 487.42 pg/mL
DSR (By Age)    1 IN: 757
DSR (Second Trimester) 1 IN: 1120
GESTATIONAL AGE AFP: 19.1 wk
MSAFP MOM: 1.2
MSAFP: 64.6 ng/mL
MSHCG MOM: 0.77
MSHCG: 19004 m[IU]/mL
Maternal Age At EDD: 29.3 YEARS
Osb Risk: 10000
Test Results:: NEGATIVE
UE3 MOM: 0.89
UE3 VALUE: 1.45 ng/mL
Weight: 154 [lb_av]

## 2016-08-09 LAB — TOXASSURE SELECT 13 (MW), URINE

## 2016-08-15 ENCOUNTER — Encounter: Payer: Self-pay | Admitting: Obstetrics & Gynecology

## 2016-08-15 DIAGNOSIS — O99320 Drug use complicating pregnancy, unspecified trimester: Secondary | ICD-10-CM

## 2016-08-15 DIAGNOSIS — F1120 Opioid dependence, uncomplicated: Secondary | ICD-10-CM | POA: Insufficient documentation

## 2016-08-20 ENCOUNTER — Encounter (HOSPITAL_COMMUNITY): Payer: Self-pay | Admitting: *Deleted

## 2016-08-20 ENCOUNTER — Inpatient Hospital Stay (HOSPITAL_COMMUNITY)
Admission: AD | Admit: 2016-08-20 | Discharge: 2016-08-20 | Disposition: A | Discharge status: Home / Self Care | Payer: Medicaid Other | Source: Ambulatory Visit | Attending: Family Medicine | Admitting: Family Medicine

## 2016-08-20 DIAGNOSIS — F1120 Opioid dependence, uncomplicated: Secondary | ICD-10-CM

## 2016-08-20 DIAGNOSIS — Z3A21 21 weeks gestation of pregnancy: Secondary | ICD-10-CM | POA: Diagnosis not present

## 2016-08-20 DIAGNOSIS — O99332 Smoking (tobacco) complicating pregnancy, second trimester: Secondary | ICD-10-CM | POA: Insufficient documentation

## 2016-08-20 DIAGNOSIS — O99320 Drug use complicating pregnancy, unspecified trimester: Secondary | ICD-10-CM

## 2016-08-20 DIAGNOSIS — R87610 Atypical squamous cells of undetermined significance on cytologic smear of cervix (ASC-US): Secondary | ICD-10-CM

## 2016-08-20 DIAGNOSIS — O2340 Unspecified infection of urinary tract in pregnancy, unspecified trimester: Secondary | ICD-10-CM | POA: Diagnosis present

## 2016-08-20 DIAGNOSIS — M549 Dorsalgia, unspecified: Secondary | ICD-10-CM | POA: Insufficient documentation

## 2016-08-20 DIAGNOSIS — O98512 Other viral diseases complicating pregnancy, second trimester: Secondary | ICD-10-CM | POA: Diagnosis not present

## 2016-08-20 DIAGNOSIS — O26892 Other specified pregnancy related conditions, second trimester: Secondary | ICD-10-CM | POA: Insufficient documentation

## 2016-08-20 DIAGNOSIS — O99322 Drug use complicating pregnancy, second trimester: Secondary | ICD-10-CM | POA: Insufficient documentation

## 2016-08-20 DIAGNOSIS — R87810 Cervical high risk human papillomavirus (HPV) DNA test positive: Secondary | ICD-10-CM | POA: Diagnosis not present

## 2016-08-20 DIAGNOSIS — O2302 Infections of kidney in pregnancy, second trimester: Secondary | ICD-10-CM | POA: Insufficient documentation

## 2016-08-20 DIAGNOSIS — Z79899 Other long term (current) drug therapy: Secondary | ICD-10-CM | POA: Diagnosis not present

## 2016-08-20 LAB — URINALYSIS, ROUTINE W REFLEX MICROSCOPIC
BILIRUBIN URINE: NEGATIVE
Glucose, UA: NEGATIVE mg/dL
Hgb urine dipstick: NEGATIVE
KETONES UR: NEGATIVE mg/dL
Nitrite: POSITIVE — AB
PROTEIN: NEGATIVE mg/dL
Specific Gravity, Urine: 1.026 (ref 1.005–1.030)
pH: 5 (ref 5.0–8.0)

## 2016-08-20 LAB — CBC WITH DIFFERENTIAL/PLATELET
BASOS ABS: 0 10*3/uL (ref 0.0–0.1)
BASOS PCT: 0 %
EOS ABS: 0.6 10*3/uL (ref 0.0–0.7)
Eosinophils Relative: 5 %
HEMATOCRIT: 31 % — AB (ref 36.0–46.0)
HEMOGLOBIN: 11.3 g/dL — AB (ref 12.0–15.0)
Lymphocytes Relative: 28 %
Lymphs Abs: 3.4 10*3/uL (ref 0.7–4.0)
MCH: 32.5 pg (ref 26.0–34.0)
MCHC: 36.5 g/dL — AB (ref 30.0–36.0)
MCV: 89.1 fL (ref 78.0–100.0)
MONOS PCT: 7 %
Monocytes Absolute: 0.9 10*3/uL (ref 0.1–1.0)
NEUTROS ABS: 7.4 10*3/uL (ref 1.7–7.7)
NEUTROS PCT: 60 %
Platelets: 278 10*3/uL (ref 150–400)
RBC: 3.48 MIL/uL — AB (ref 3.87–5.11)
RDW: 12.4 % (ref 11.5–15.5)
WBC: 12.3 10*3/uL — AB (ref 4.0–10.5)

## 2016-08-20 MED ORDER — NITROFURANTOIN MONOHYD MACRO 100 MG PO CAPS: 100.0000 mg | ORAL_CAPSULE | Freq: Every day | ORAL | 6 refills | Status: DC

## 2016-08-20 MED ORDER — CEPHALEXIN 500 MG PO CAPS: 500.0000 mg | ORAL_CAPSULE | Freq: Two times a day (BID) | ORAL | 0 refills | Status: AC

## 2016-08-20 MED ORDER — SODIUM CHLORIDE 0.9 % IV SOLN
INTRAVENOUS | Status: DC
  Administered 2016-08-20: 03:00:00 via INTRAVENOUS

## 2016-08-20 MED ORDER — DEXTROSE 5 % IV SOLN
2.0000 g | INTRAVENOUS | Status: DC
  Administered 2016-08-20: 2 g via INTRAVENOUS
  Filled 2016-08-20: qty 2

## 2016-08-20 MED ORDER — PHENAZOPYRIDINE HCL 200 MG PO TABS: 200.0000 mg | ORAL_TABLET | Freq: Three times a day (TID) | ORAL | 0 refills | Status: DC | PRN

## 2016-08-20 NOTE — Discharge Instructions (Signed)
Pregnancy and Urinary Tract Infection WHAT IS A URINARY TRACT INFECTION? A urinary tract infection (UTI) is an infection of any part of the urinary tract. This includes the kidneys, the tubes that connect your kidneys to your bladder (ureters), the bladder, and the tube that carries urine out of your body (urethra). These organs make, store, and get rid of urine in the body. A UTI can be a bladder infection (cystitis) or a kidney infection (pyelonephritis). This infection may be caused by fungi, viruses, and bacteria. Bacteria are the most common cause of UTIs. You are more likely to develop a UTI during pregnancy because:  The physical and hormonal changes your body goes through can make it easier for bacteria to get into your urinary tract.  Your growing baby puts pressure on your uterus and can affect urine flow. DOES A UTI PLACE MY BABY AT RISK? An untreated UTI during pregnancy could lead to a kidney infection, which can cause health problems that could affect your baby. Possible complications of an untreated UTI include:  Having your baby before 37 weeks of pregnancy (premature).  Having a baby with a low birth weight.  Developing high blood pressure during pregnancy (preeclampsia). WHAT ARE THE SYMPTOMS OF A UTI? Symptoms of a UTI include:  Fever.  Frequent urination or passing small amounts of urine frequently.  Needing to urinate urgently.  Pain or a burning sensation with urination.  Urine that smells bad or unusual.  Cloudy urine.  Pain in the lower abdomen or back.  Trouble urinating.  Blood in the urine.  Vomiting or being less hungry than normal.  Diarrhea or abdominal pain.  Vaginal discharge. WHAT ARE THE TREATMENT OPTIONS FOR A UTI DURING PREGNANCY? Treatment for this condition may include:  Antibiotic medicines that are safe to take during pregnancy.  Other medicines to treat less common causes of UTI. HOW CAN I PREVENT A UTI? To prevent a UTI:  Go  to the bathroom as soon as you feel the need.  Always wipe from front to back.  Wash your genital area with soap and warm water daily.  Empty your bladder before and after sex.  Wear cotton underwear.  Limit your intake of high sugar foods or drinks, such as regular soda, juice, and sweets..  Drink 6-8 glasses of water daily.  Do not wear tight-fitting pants.  Do not douche or use deodorant sprays.  Do not drink alcohol, caffeine, or carbonated drinks. These can irritate the bladder. WHEN SHOULD I SEEK MEDICAL CARE? Seek medical care if:  Your symptoms do not improve or get worse.  You have a fever after two days of treatment.  You have a rash.  You have abnormal vaginal discharge.  You have back or side pain.  You have chills.  You have nausea and vomiting. WHEN SHOULD I SEEK IMMEDIATE MEDICAL CARE? Seek immediate medical care if you are pregnant and:  You feel contractions in your uterus.  You have lower belly pain.  You have a gush of fluid from your vagina.  You have blood in your urine.  You are vomiting and cannot keep down any medicines or water. This information is not intended to replace advice given to you by your health care provider. Make sure you discuss any questions you have with your health care provider. Document Released: 10/21/2010 Document Revised: 11/29/2015 Document Reviewed: 05/17/2015 Elsevier Interactive Patient Education  2017 Elsevier Inc.    Pyelonephritis, Adult Introduction Pyelonephritis is a kidney infection. The kidneys  are organs that help clean your blood by moving waste out of your blood and into your pee (urine). This infection can happen quickly, or it can last for a long time. In most cases, it clears up with treatment and does not cause other problems. Follow these instructions at home: Medicines  Take over-the-counter and prescription medicines only as told by your doctor.  Take your antibiotic medicine as told by  your doctor. Do not stop taking the medicine even if you start to feel better. General instructions  Drink enough fluid to keep your pee clear or pale yellow.  Avoid caffeine, tea, and carbonated drinks.  Pee (urinate) often. Avoid holding in pee for long periods of time.  Pee before and after sex.  After pooping (having a bowel movement), women should wipe from front to back. Use each tissue only once.  Keep all follow-up visits as told by your doctor. This is important. Contact a doctor if:  You do not feel better after 2 days.  Your symptoms get worse.  You have a fever. Get help right away if:  You cannot take your medicine or drink fluids as told.  You have chills and shaking.  You throw up (vomit).  You have very bad pain in your side (flank) or back.  You feel very weak or you pass out (faint). This information is not intended to replace advice given to you by your health care provider. Make sure you discuss any questions you have with your health care provider. Document Released: 08/03/2004 Document Revised: 12/02/2015 Document Reviewed: 10/19/2014  2017 Elsevier

## 2016-08-20 NOTE — Progress Notes (Signed)
[facility] Will review labs and make plan of care

## 2016-08-20 NOTE — Progress Notes (Signed)
Dr [facility] in earlier to discuss d/c plan. Written and verbal d/c instructions given and understanding voiced.

## 2016-08-20 NOTE — Progress Notes (Signed)
Dr Omer [facility] notified of pt's admission and status. Will see pt

## 2016-08-20 NOTE — MAU Provider Note (Signed)
Chief Complaint:  Abdominal Pain and Back Pain   HPI: Tonya David is a 31 y.o. [id number] at [redacted]w[redacted]d who presents to maternity admissions reporting abdominal and back pain.   Friday symptoms started with constant cramping/pain in entire abdomen and back "felt like back labor". Pain worsened over the weekend. Denies F/C. No N/V/D. No urinary frequency or dysuria. Occasionally incomplete voiding. +urgency. +suprapubic pressure. Had +UTI on 08/02/16, showed E. Coli pan-sensitive, was treated with Macrobid for 7 days, finished course. This would be the 4th time she has had a UTI this pregnancy (all pan-sensitive E. Coli). When investigating further, patient states she wipes from back to front when she uses the bathroom (urinating only), and never drinks water, only soda.  Denies contractions, leakage of fluid, vaginal discharge or vaginal bleeding. Good fetal movement.   Pregnancy Course:   Past Medical History: Past Medical History:  Diagnosis Date  . Anemia   . Headache(784.0)   . Preterm labor   . Urinary tract infection     Past obstetric history: OB History  Gravida Para Term Preterm AB Living  10 6 2 4 3 6   SAB TAB Ectopic Multiple Live Births  1 2 0 0 6    # Outcome Date GA Lbr Len/2nd Weight Sex Delivery Anes PTL Lv  10 Current           9 Preterm 04/09/14 [redacted]w[redacted]d 01:00 / 00:05 5 lb 8.7 oz (2.515 kg) F Vag-Spont EPI  LIV  8 Term 06/14/12 [redacted]w[redacted]d 04:10 / 00:12 6 lb 6.8 oz (2.914 kg) F Vag-Spont EPI  LIV  7 Preterm 2011 [redacted]w[redacted]d   F Vag-Spont  Y LIV  6 Preterm 2009 [redacted]w[redacted]d   F Vag-Spont  Y LIV  5 Preterm 05/18/07 [redacted]w[redacted]d   M Vag-Spont   LIV  4 Term 2007 [redacted]w[redacted]d   M Vag-Spont  N LIV  3 SAB              Birth Comments: System Generated. Please review and update pregnancy details.  2 TAB           1 TAB               Past Surgical History: Past Surgical History:  Procedure Laterality Date  . THERAPEUTIC ABORTION       Family History: Family History  Problem Relation Age of Onset   . Cancer Maternal Grandmother   . Cancer Maternal Grandfather   . Alcohol abuse Neg Hx   . Arthritis Neg Hx   . Asthma Neg Hx   . Birth defects Neg Hx   . COPD Neg Hx   . Depression Neg Hx   . Diabetes Neg Hx   . Drug abuse Neg Hx   . Early death Neg Hx   . Hearing loss Neg Hx   . Heart disease Neg Hx   . Hypertension Neg Hx   . Hyperlipidemia Neg Hx   . Kidney disease Neg Hx   . Learning disabilities Neg Hx   . Mental illness Neg Hx   . Mental retardation Neg Hx   . Miscarriages / Stillbirths Neg Hx   . Stroke Neg Hx   . Vision loss Neg Hx   . Other Neg Hx     Social History: Social History  Substance Use Topics  . Smoking status: Current Every Day Smoker    Packs/day: 0.25    Types: Cigarettes  . Smokeless tobacco: Current User  . Alcohol use Yes  Comment: occasionally    Allergies: No Known Allergies  Meds:  Prescriptions Prior to Admission  Medication Sig Dispense Refill Last Dose  . acetaminophen (TYLENOL) 500 MG tablet Take 1,000 mg by mouth every 6 (six) hours as needed for moderate pain.    08/19/2016 at Unknown time  . buprenorphine (SUBUTEX) 8 MG SUBL SL tablet Place 8 mg under the tongue 2 (two) times daily.  0 08/19/2016 at Unknown time  . Prenatal Vit-Fe Fumarate-FA (MULTIVITAMIN-PRENATAL) 27-0.8 MG TABS tablet Take 1 tablet by mouth daily. 30 each 9 08/19/2016 at Unknown time  . promethazine (PHENERGAN) 25 MG tablet Take 0.5-1 tablets (12.5-25 mg total) by mouth every 6 (six) hours as needed. 30 tablet 0 Past Week at Unknown time  . nitrofurantoin, macrocrystal-monohydrate, (MACROBID) 100 MG capsule Take 1 capsule (100 mg total) by mouth 2 (two) times daily. (Patient not taking: Reported on 08/02/2016) 10 capsule 0 Not Taking  . Prenatal Vit-Fe Fumarate-FA (TH PRENATAL VITAMINS) 28-0.8 MG TABS Take 1 tablet by mouth daily. 30 tablet 2 Taking    I have reviewed patient's Past Medical Hx, Surgical Hx, Family Hx, Social Hx, medications and allergies.    ROS:  A comprehensive ROS was negative except per HPI.    Physical Exam  Patient Vitals for the past 24 hrs:  BP Temp Pulse Resp Height Weight  08/20/16 0040 122/65 98.4 F (36.9 C) 85 18 5\' 6"  (1.676 m) 156 lb 12.8 oz (71.1 kg)   Constitutional: Well-developed, well-nourished female in no acute distress.  Cardiovascular: normal rate, rhythm, no murmurs Respiratory: normal effort, CTAB GI: Abd soft, +suprapubic tenderness, gravid appropriate for gestational age. Pos BS x 4 MS: Extremities nontender, no edema, normal ROM Neurologic: Alert and oriented x 4.  GU: +CVAT bilaterally. FHT:  Doppler 141 bpm   Labs: Results for orders placed or performed during the hospital encounter of 08/20/16 (from the past 24 hour(s))  Urinalysis, Routine w reflex microscopic     Status: Abnormal   Collection Time: 08/20/16 12:45 AM  Result Value Ref Range   Color, Urine YELLOW YELLOW   APPearance HAZY (A) CLEAR   Specific Gravity, Urine 1.026 1.005 - 1.030   pH 5.0 5.0 - 8.0   Glucose, UA NEGATIVE NEGATIVE mg/dL   Hgb urine dipstick NEGATIVE NEGATIVE   Bilirubin Urine NEGATIVE NEGATIVE   Ketones, ur NEGATIVE NEGATIVE mg/dL   Protein, ur NEGATIVE NEGATIVE mg/dL   Nitrite POSITIVE (A) NEGATIVE   Leukocytes, UA TRACE (A) NEGATIVE   RBC / HPF 0-5 0 - 5 RBC/hpf   WBC, UA 0-5 0 - 5 WBC/hpf   Bacteria, UA MANY (A) NONE SEEN   Squamous Epithelial / LPF 0-5 (A) NONE SEEN   Mucous PRESENT     Imaging:  [place] Mfm Ob Comp + 14 Wk  Result Date: 08/04/2016 ----------------------------------------------------------------------  OBSTETRICS REPORT                      (Signed Final 08/04/2016 04:05 pm) ---------------------------------------------------------------------- Patient Info  ID #:       [id number]                         D.O.B.:   [birth year] (30 yrs)  Name:       Tonya David              Visit Date:  08/04/2016 03:17 pm ----------------------------------------------------------------------  Performed By  Performed By:  Marcellina Millin          Ref. Address:     [address]                                                             [place], [place]                                                             [address]  Attending:        Charlsie Merles MD         Location:         [facility]  Referred By:      [name] MD ---------------------------------------------------------------------- Orders   #  Description                                 Code   1  [place] MFM OB COMP + 14 WK                      76805.01  ----------------------------------------------------------------------   #  Ordered By               Order #        Accession #    Episode #   1  Nicholaus Bloom                [id number]      [id number]     [id number]  ---------------------------------------------------------------------- Indications   [redacted] weeks gestation of pregnancy                Z3A.19   Pregnancy comlicated by subutex                O99.320, F11.20,   maintenance, antepartum                        Z79.891   Late prenatal care, second trimester           O09.32   Poor obstetric history: Previous preterm       O09.219   delivery, antepartum x 4 (@ 36 weeks)   Smoking complicating pregnancy, second         O99.332   trimester  ---------------------------------------------------------------------- OB History  Gravidity:    10        Term:   2        Prem:   4        SAB:   1  TOP:  2       Ectopic:  0        Living: 6 ---------------------------------------------------------------------- Fetal Evaluation  Num Of Fetuses:     1  Fetal Heart         143  Rate(bpm):  Cardiac Activity:   Observed  Presentation:       Cephalic  Placenta:           Posterior, above cervical os  P. Cord Insertion:  Visualized  Amniotic Fluid  AFI FV:      Subjectively within normal limits ----------------------------------------------------------------------  Biometry  BPD:      44.8  mm     G. Age:  19w 4d         68  %    CI:        72.91   %   70 - 86                                                          FL/HC:      18.8   %   16.1 - 18.3  HC:      166.8  mm     G. Age:  19w 3d         53  %    HC/AC:      1.10       1.09 - 1.39  AC:      151.7  mm     G. Age:  20w 3d         83  %    FL/BPD:     70.1   %  FL:       31.4  mm     G. Age:  19w 5d         65  %    FL/AC:      20.7   %   20 - 24  HUM:      30.5  mm     G. Age:  20w 1d         75  %  CER:      20.8  mm     G. Age:  19w 6d         64  %  Est. FW:     326  gm    0 lb 11 oz      58  % ---------------------------------------------------------------------- Gestational Age  U/S Today:     19w 6d                                        EDD:   12/23/16  Best:          19w 1d    Det. ByMarcella Dubs         EDD:   12/28/16                                      (05/02/16) ---------------------------------------------------------------------- Anatomy  Cranium:               Appears normal  Aortic Arch:            Appears normal  Cavum:                 Appears normal         Ductal Arch:            Appears normal  Ventricles:            Appears normal         Diaphragm:              Appears normal  Choroid Plexus:        Appears normal         Stomach:                Appears normal, left                                                                        sided  Cerebellum:            Appears normal         Abdomen:                Appears normal  Posterior Fossa:       Appears normal         Abdominal Wall:         Appears nml (cord                                                                        insert, abd wall)  Nuchal Fold:           Appears normal         Cord Vessels:           Appears normal (3                                                                        vessel cord)  Face:                  Appears normal         Kidneys:                Appear normal                          (orbits and profile)  Lips:                  Appears normal         Bladder:                Appears normal  Thoracic:  Appears normal         Spine:                  Appears normal  Heart:                 Appears normal         Upper Extremities:      Appears normal                         (4CH, axis, and situs  RVOT:                  Appears normal         Lower Extremities:      Appears normal  LVOT:                  Appears normal  Other:  Fetus appears to be a female. Heels visualized. Open hands          visualized. Nasal bone visualized. ---------------------------------------------------------------------- Cervix Uterus Adnexa  Cervix  Length:            3.6  cm.  Normal appearance by transabdominal scan. ---------------------------------------------------------------------- Impression  Singleton intrauterine pregnancy at 19+ 1 weeks, here for  anatomic survey. On subutex. History of multiple late preterm  deliveries  Review of the anatomy shows no sonographic markers for  aneuploidy or structural anomalies  Amniotic fluid volume is normal  Estimated fetal weight is 326g which is growth in the 58th  percentile ---------------------------------------------------------------------- Recommendations  Repeat scan in 4 weeks. will check cervical length at that visit ----------------------------------------------------------------------                 Charlsie Merles, MD Electronically Signed Final Report   08/04/2016 04:05 pm ----------------------------------------------------------------------   MAU Course: CBC w/ diff - WBC 12.3 without left shift UA +nitrite, +pyuria, +bacteria UCx pending SVE - closed/thick/high Afebrile, no tachycardia, no tachypnea, no leukocytosis, BPs stable  MDM: Plan of care reviewed with patient, including labs and tests ordered and medical treatment. OK for discharge home, has mild pyelonephritis, only CVAT noted with +UA, no fevers/chills, no N/V, no tachycardia,  no leukocytosis, no tachypnea. Discussed with patient if she develops any worsening symptoms (ie. Fevers/chills, palpitations/tachycardia, feeling worse, intractable N/V, worsening symptoms of pain, SOB), to return to MAU/ED immediately. Discussed importance of treatment with antibiotics followed by prophylactic antibiotics for duration of pregnancy. Patient to follow up on Monday with Ob provider for symptoms.    Assessment: 1. Pyelonephritis affecting pregnancy in second trimester   2. Buprenorphine maintenance treatment affecting pregnancy, antepartum (HCC)   3. ASCUS with positive high risk HPV cervical     Plan: Given 2g Rocephin in MAU, to go home with Keflex treatment for 10 days, then followed by macrobid suppression therapy for duration of pregnancy. Discharge home in stable condition.  Worsening symptoms precautions given Preterm labor precautions Note sent to office at [facility] for follow up on Monday with provider for monitoring of symptoms.     Allergies as of 08/20/2016   No Known Allergies     Medication List    TAKE these medications   acetaminophen 500 MG tablet Commonly known as:  TYLENOL Take 1,000 mg by mouth every 6 (six) hours as needed for moderate pain.   buprenorphine 8 MG Subl SL tablet Commonly known as:  SUBUTEX Place 8 mg under the tongue 2 (two) times daily.   cephALEXin 500  MG capsule Commonly known as:  KEFLEX Take 1 capsule (500 mg total) by mouth 2 (two) times daily.   nitrofurantoin (macrocrystal-monohydrate) 100 MG capsule Commonly known as:  MACROBID Take 1 capsule (100 mg total) by mouth at bedtime. Take medication daily for duration of pregnancy after completed course of Keflex. What changed:  when to take this  additional instructions   phenazopyridine 200 MG tablet Commonly known as:  PYRIDIUM Take 1 tablet (200 mg total) by mouth 3 (three) times daily as needed for pain.   promethazine 25 MG tablet Commonly known as:   PHENERGAN Take 0.5-1 tablets (12.5-25 mg total) by mouth every 6 (six) hours as needed.   TH PRENATAL VITAMINS 28-0.8 MG Tabs Take 1 tablet by mouth daily.   multivitamin-prenatal 27-0.8 MG Tabs tablet Take 1 tablet by mouth daily.       Jen Mow, DO OB Fellow [facility] 08/20/2016 1:31 AM

## 2016-08-20 NOTE — MAU Note (Signed)
Started hurting Friday night from upper abd to lower abd front and back. A lot of pain in pubic bone. Denies vag bleeding. Some vag d/c.

## 2016-08-21 ENCOUNTER — Telehealth: Payer: Self-pay | Admitting: *Deleted

## 2016-08-21 NOTE — Telephone Encounter (Signed)
-----   Message from Lewayne Buntingyvona Gardner, [place] sent at 08/21/2016  8:07 AM EST ----- Pregnant pt needs an appt today or tomorrow with a provider f/u pyelonephritis. See Dr. Omer JackMumaw notes, thanks!

## 2016-08-21 NOTE — Telephone Encounter (Signed)
Called Pt and left her a msg told her she needed to schedule an earlier appt, due to her hosptital visit, waiting on patient to callback.Tonya David.Tonya David..Tonya David

## 2016-08-22 ENCOUNTER — Ambulatory Visit (INDEPENDENT_AMBULATORY_CARE_PROVIDER_SITE_OTHER): Payer: Medicaid Other | Admitting: Certified Nurse Midwife

## 2016-08-22 VITALS — BP 121/71 | HR 102 | Wt 157.0 lb

## 2016-08-22 DIAGNOSIS — O0992 Supervision of high risk pregnancy, unspecified, second trimester: Secondary | ICD-10-CM

## 2016-08-22 DIAGNOSIS — R87810 Cervical high risk human papillomavirus (HPV) DNA test positive: Secondary | ICD-10-CM

## 2016-08-22 DIAGNOSIS — O26872 Cervical shortening, second trimester: Secondary | ICD-10-CM

## 2016-08-22 DIAGNOSIS — O26879 Cervical shortening, unspecified trimester: Secondary | ICD-10-CM

## 2016-08-22 DIAGNOSIS — O99322 Drug use complicating pregnancy, second trimester: Secondary | ICD-10-CM

## 2016-08-22 DIAGNOSIS — O09212 Supervision of pregnancy with history of pre-term labor, second trimester: Secondary | ICD-10-CM

## 2016-08-22 DIAGNOSIS — F1120 Opioid dependence, uncomplicated: Secondary | ICD-10-CM

## 2016-08-22 DIAGNOSIS — Z79891 Long term (current) use of opiate analgesic: Secondary | ICD-10-CM

## 2016-08-22 DIAGNOSIS — R87610 Atypical squamous cells of undetermined significance on cytologic smear of cervix (ASC-US): Secondary | ICD-10-CM

## 2016-08-22 DIAGNOSIS — O99320 Drug use complicating pregnancy, unspecified trimester: Secondary | ICD-10-CM

## 2016-08-22 LAB — CULTURE, OB URINE: Culture: >=100000 — AB

## 2016-08-22 MED ORDER — HYDROXYPROGESTERONE CAPROATE 250 MG/ML IM OIL
250.0000 mg | TOPICAL_OIL | Freq: Once | INTRAMUSCULAR | Status: AC
  Administered 2016-08-22: 250 mg via INTRAMUSCULAR

## 2016-08-22 NOTE — Progress Notes (Signed)
   PRENATAL VISIT NOTE  Subjective:  Tonya David is a 31 y.o. [id number] at 5435w5d being seen today for ongoing prenatal care.  She is currently monitored for the following issues for this high-risk pregnancy and has History of preterm delivery; Short cervical length during pregnancy; Supervision of high risk pregnancy in second trimester; UTI in pregnancy, antepartum, first trimester; ASCUS with positive high risk HPV cervical; Buprenorphine maintenance treatment affecting pregnancy, antepartum (HCC); and Recurrent UTI (urinary tract infection) complicating pregnancy on her problem list.  Patient reports backache, no bleeding, no contractions, no cramping and no leaking.  Contractions: Not present. Vag. Bleeding: None.  Movement: Present. Denies leaking of fluid. Drinking about 3 water bottles/day.  Encouraged to increase to 8 water bottles/day.  Discussed signs/symptoms of pyelo.  FOB present for exam.  BTL discussed encouraged to try IUD first.   The following portions of the patient's history were reviewed and updated as appropriate: allergies, current medications, past family history, past medical history, past social history, past surgical history and problem list. Problem list updated.  Objective:   Vitals:   08/22/16 1501  BP: 121/71  Pulse: (!) 102  Weight: 157 lb (71.2 kg)    Fetal Status: Fetal Heart Rate (bpm): 145 Fundal Height: 22 cm Movement: Present     General:  Alert, oriented and cooperative. Patient is in no acute distress.  Skin: Skin is warm and dry. No rash noted.   Cardiovascular: Normal heart rate noted  Respiratory: Normal respiratory effort, no problems with respiration noted  Abdomen: Soft, gravid, appropriate for gestational age. Pain/Pressure: Present     Pelvic:  Cervical exam deferred        Extremities: Normal range of motion.     Mental Status: Normal mood and affect. Normal behavior. Normal judgment and thought content.   Assessment and Plan:    Pregnancy: [id number] at 8135w5d  1. Supervision of high risk pregnancy in second trimester     Hx of preterm birth, 17-P started today.       Recurrent UTI. On Keflex currently, will be on macrobid for suppression.  Discussed diet and increased fluid intake.    2. Pregnancy complicated by cervical shortening in previous pregnancy      Scheduled CL check, last scan 3.6cm on 08/04/16  3. Buprenorphine maintenance treatment affecting pregnancy, antepartum (HCC)     On Subutex  4. ASCUS with positive high risk HPV cervical      Colpo postpartum  Preterm labor symptoms and general obstetric precautions including but not limited to vaginal bleeding, contractions, leaking of fluid and fetal movement were reviewed in detail with the patient. Please refer to After Visit Summary for other counseling recommendations.  [facility]2016[facility].   Roe Coombsachelle A Cynda Soule, CNM

## 2016-08-22 NOTE — Progress Notes (Signed)
Pt is having lower pelvic pain. [facility] Pt states symptoms not much better. Pt would like to discuss tubal

## 2016-08-22 NOTE — Telephone Encounter (Signed)
Pt seen today

## 2016-08-22 NOTE — Telephone Encounter (Signed)
LVM for pt to cb.

## 2016-08-29 ENCOUNTER — Ambulatory Visit: Payer: Medicaid Other

## 2016-08-30 ENCOUNTER — Encounter: Payer: Medicaid Other | Admitting: Certified Nurse Midwife

## 2016-09-05 ENCOUNTER — Ambulatory Visit: Payer: Medicaid Other

## 2016-09-12 ENCOUNTER — Ambulatory Visit: Payer: Medicaid Other

## 2016-09-15 ENCOUNTER — Ambulatory Visit (HOSPITAL_COMMUNITY)
Admission: RE | Admit: 2016-09-15 | Discharge: 2016-09-15 | Disposition: A | Discharge status: Home / Self Care | Payer: Medicaid Other | Source: Ambulatory Visit | Attending: Obstetrics & Gynecology | Admitting: Obstetrics & Gynecology

## 2016-09-15 ENCOUNTER — Other Ambulatory Visit (HOSPITAL_COMMUNITY): Payer: Self-pay | Admitting: Obstetrics and Gynecology

## 2016-09-15 ENCOUNTER — Encounter (HOSPITAL_COMMUNITY): Payer: Self-pay

## 2016-09-15 ENCOUNTER — Inpatient Hospital Stay (HOSPITAL_COMMUNITY)
Admission: AD | Admit: 2016-09-15 | Discharge: 2016-09-17 | DRG: 782 | Disposition: A | Discharge status: Home / Self Care | Payer: Medicaid Other | Source: Ambulatory Visit | Attending: Obstetrics & Gynecology | Admitting: Obstetrics & Gynecology

## 2016-09-15 DIAGNOSIS — O99320 Drug use complicating pregnancy, unspecified trimester: Principal | ICD-10-CM

## 2016-09-15 DIAGNOSIS — O26872 Cervical shortening, second trimester: Principal | ICD-10-CM | POA: Diagnosis present

## 2016-09-15 DIAGNOSIS — Z3A25 25 weeks gestation of pregnancy: Secondary | ICD-10-CM | POA: Diagnosis not present

## 2016-09-15 DIAGNOSIS — F17210 Nicotine dependence, cigarettes, uncomplicated: Secondary | ICD-10-CM | POA: Diagnosis present

## 2016-09-15 DIAGNOSIS — F1120 Opioid dependence, uncomplicated: Secondary | ICD-10-CM

## 2016-09-15 DIAGNOSIS — O4700 False labor before 37 completed weeks of gestation, unspecified trimester: Secondary | ICD-10-CM | POA: Diagnosis present

## 2016-09-15 DIAGNOSIS — O479 False labor, unspecified: Secondary | ICD-10-CM | POA: Diagnosis present

## 2016-09-15 DIAGNOSIS — O99332 Smoking (tobacco) complicating pregnancy, second trimester: Secondary | ICD-10-CM | POA: Diagnosis present

## 2016-09-15 LAB — URINALYSIS, ROUTINE W REFLEX MICROSCOPIC
Bilirubin Urine: NEGATIVE
Glucose, UA: 50 mg/dL — AB
Hgb urine dipstick: NEGATIVE
Ketones, ur: NEGATIVE mg/dL
Leukocytes, UA: NEGATIVE
Nitrite: POSITIVE — AB
PH: 7 (ref 5.0–8.0)
Protein, ur: NEGATIVE mg/dL
SPECIFIC GRAVITY, URINE: 1.019 (ref 1.005–1.030)

## 2016-09-15 LAB — CBC
HCT: 30.6 % — ABNORMAL LOW (ref 36.0–46.0)
Hemoglobin: 10.6 g/dL — ABNORMAL LOW (ref 12.0–15.0)
MCH: 32.9 pg (ref 26.0–34.0)
MCHC: 34.6 g/dL (ref 30.0–36.0)
MCV: 95 fL (ref 78.0–100.0)
Platelets: 281 10*3/uL (ref 150–400)
RBC: 3.22 MIL/uL — AB (ref 3.87–5.11)
RDW: 12.7 % (ref 11.5–15.5)
WBC: 12.8 10*3/uL — ABNORMAL HIGH (ref 4.0–10.5)

## 2016-09-15 LAB — TYPE AND SCREEN
ABO/RH(D): O POS
Antibody Screen: NEGATIVE

## 2016-09-15 MED ORDER — PRENATAL MULTIVITAMIN CH: 1.0000 | ORAL_TABLET | Freq: Every day | ORAL | Status: DC

## 2016-09-15 MED ORDER — CALCIUM CARBONATE ANTACID 500 MG PO CHEW: 2.0000 | CHEWABLE_TABLET | ORAL | Status: DC | PRN

## 2016-09-15 MED ORDER — BUPRENORPHINE HCL-NALOXONE HCL 8-2 MG SL SUBL: 1.0000 | SUBLINGUAL_TABLET | Freq: Every day | SUBLINGUAL | Status: DC

## 2016-09-15 MED ORDER — BUPRENORPHINE HCL 8 MG SL SUBL
4.0000 mg | SUBLINGUAL_TABLET | Freq: Every day | SUBLINGUAL | Status: DC
  Administered 2016-09-15 – 2016-09-16 (×2): 4 mg via SUBLINGUAL
  Filled 2016-09-15 (×2): qty 1

## 2016-09-15 MED ORDER — BETAMETHASONE SOD PHOS & ACET 6 (3-3) MG/ML IJ SUSP
12.0000 mg | INTRAMUSCULAR | Status: AC
  Administered 2016-09-15 – 2016-09-16 (×2): 12 mg via INTRAMUSCULAR
  Filled 2016-09-15 (×2): qty 2

## 2016-09-15 MED ORDER — NIFEDIPINE 10 MG PO CAPS
20.0000 mg | ORAL_CAPSULE | Freq: Four times a day (QID) | ORAL | Status: DC
  Administered 2016-09-15 – 2016-09-17 (×8): 20 mg via ORAL
  Filled 2016-09-15 (×9): qty 2

## 2016-09-15 MED ORDER — PRENATAL MULTIVITAMIN CH
1.0000 | ORAL_TABLET | Freq: Every day | ORAL | Status: DC
  Administered 2016-09-15 – 2016-09-17 (×3): 1 via ORAL
  Filled 2016-09-15 (×3): qty 1

## 2016-09-15 MED ORDER — DOCUSATE SODIUM 100 MG PO CAPS
100.0000 mg | ORAL_CAPSULE | Freq: Every day | ORAL | Status: DC
  Administered 2016-09-16: 100 mg via ORAL
  Filled 2016-09-15: qty 1

## 2016-09-15 MED ORDER — BUPRENORPHINE HCL 8 MG SL SUBL
8.0000 mg | SUBLINGUAL_TABLET | Freq: Two times a day (BID) | SUBLINGUAL | Status: DC
  Administered 2016-09-15 – 2016-09-17 (×4): 8 mg via SUBLINGUAL
  Filled 2016-09-15 (×4): qty 1

## 2016-09-15 MED ORDER — ACETAMINOPHEN 325 MG PO TABS: 650.0000 mg | ORAL_TABLET | ORAL | Status: DC | PRN

## 2016-09-15 MED ORDER — NITROFURANTOIN MONOHYD MACRO 100 MG PO CAPS
100.0000 mg | ORAL_CAPSULE | Freq: Every day | ORAL | Status: DC
  Administered 2016-09-15 – 2016-09-16 (×2): 100 mg via ORAL
  Filled 2016-09-15 (×2): qty 1

## 2016-09-15 MED ORDER — DOCUSATE SODIUM 100 MG PO CAPS: 100.0000 mg | ORAL_CAPSULE | Freq: Every day | ORAL | Status: DC

## 2016-09-15 MED ORDER — BUPRENORPHINE HCL-NALOXONE HCL 8-2 MG SL SUBL: 1.0000 | SUBLINGUAL_TABLET | Freq: Two times a day (BID) | SUBLINGUAL | Status: DC

## 2016-09-15 MED ORDER — ZOLPIDEM TARTRATE 5 MG PO TABS: 5.0000 mg | ORAL_TABLET | Freq: Every evening | ORAL | Status: DC | PRN

## 2016-09-15 MED ORDER — BUPRENORPHINE HCL-NALOXONE HCL 2-0.5 MG SL SUBL: 2.0000 | SUBLINGUAL_TABLET | Freq: Every day | SUBLINGUAL | Status: DC

## 2016-09-15 NOTE — ED Notes (Signed)
Report given to Elnita Maxwellheryl, charge RN in MAU on Pt being brought to MAU for further evaluation for possible PTL.

## 2016-09-15 NOTE — H&P (Signed)
ANTEPARTUM ADMISSION HISTORY AND PHYSICAL NOTE   History of Present Illness: Tonya David is a 31 y.o. [id number] at [redacted]w[redacted]d admitted for Preterm labor, short cervix.  [place] in MFM today  Patient reports the fetal movement as active. Patient reports uterine contraction  activity as irregular,  Patient reports  vaginal bleeding as none. Patient describes fluid per vagina as None. Fetal presentation is cephalic.  Patient Active Problem List   Diagnosis Date Noted  . Preterm contractions 09/15/2016  . Short cervix in second trimester, antepartum 09/15/2016  . Recurrent UTI (urinary tract infection) complicating pregnancy 08/20/2016  . Buprenorphine maintenance treatment affecting pregnancy, antepartum (HCC) 08/15/2016  . ASCUS with positive high risk HPV cervical 08/02/2016  . UTI in pregnancy, antepartum, first trimester 12/08/2013  . History of preterm delivery 03/14/2012  . Short cervical length during pregnancy 03/14/2012  . Supervision of high risk pregnancy in second trimester 03/14/2012    Past Medical History:  Diagnosis Date  . Anemia   . Headache(784.0)   . Preterm labor   . Urinary tract infection     Past Surgical History:  Procedure Laterality Date  . THERAPEUTIC ABORTION      OB History  Gravida Para Term Preterm AB Living  10 6 2 4 3 6   SAB TAB Ectopic Multiple Live Births  1 2 0 0 6    # Outcome Date GA Lbr Len/2nd Weight Sex Delivery Anes PTL Lv  10 Current           9 Preterm 04/09/14 [redacted]w[redacted]d 01:00 / 00:05 2.515 kg (5 lb 8.7 oz) F Vag-Spont EPI  LIV  8 Term 06/14/12 [redacted]w[redacted]d 04:10 / 00:12 2.914 kg (6 lb 6.8 oz) F Vag-Spont EPI  LIV  7 Preterm 2011 [redacted]w[redacted]d   F Vag-Spont  Y LIV  6 Preterm 2009 [redacted]w[redacted]d   F Vag-Spont  Y LIV  5 Preterm 05/18/07 [redacted]w[redacted]d   M Vag-Spont   LIV  4 Term 2007 [redacted]w[redacted]d   M Vag-Spont  N LIV  3 SAB              Birth Comments: System Generated. Please review and update pregnancy details.  2 TAB           1 TAB               Social History    Social History  . Marital status: Single    Spouse name: N/A  . Number of children: N/A  . Years of education: N/A   Social History Main Topics  . Smoking status: Current Every Day Smoker    Packs/day: 0.25    Types: Cigarettes  . Smokeless tobacco: Current User  . Alcohol use Yes     Comment: occasionally  . Drug use: No  . Sexual activity: Yes    Birth control/ protection: None   Other Topics Concern  . Not on file   Social History Narrative  . No narrative on file    Family History  Problem Relation Age of Onset  . Cancer Maternal Grandmother   . Cancer Maternal Grandfather   . Alcohol abuse Neg Hx   . Arthritis Neg Hx   . Asthma Neg Hx   . Birth defects Neg Hx   . COPD Neg Hx   . Depression Neg Hx   . Diabetes Neg Hx   . Drug abuse Neg Hx   . Early death Neg Hx   . Hearing loss Neg Hx   .  Heart disease Neg Hx   . Hypertension Neg Hx   . Hyperlipidemia Neg Hx   . Kidney disease Neg Hx   . Learning disabilities Neg Hx   . Mental illness Neg Hx   . Mental retardation Neg Hx   . Miscarriages / Stillbirths Neg Hx   . Stroke Neg Hx   . Vision loss Neg Hx   . Other Neg Hx     No Known Allergies  Prescriptions Prior to Admission  Medication Sig Dispense Refill Last Dose  . buprenorphine (SUBUTEX) 8 MG SUBL SL tablet Place 8 mg under the tongue 3 (three) times daily. Pt states that she takes 8mg  in the am and 8mg  around 1400. Then 4mg  at bedtime.  0 09/15/2016  . nitrofurantoin, macrocrystal-monohydrate, (MACROBID) 100 MG capsule Take 1 capsule (100 mg total) by mouth at bedtime. Take medication daily for duration of pregnancy after completed course of Keflex. 30 capsule 6 09/14/2016 at Unknown time  . phenazopyridine (PYRIDIUM) 200 MG tablet Take 1 tablet (200 mg total) by mouth 3 (three) times daily as needed for pain. 20 tablet 0 09/11/2016  . Prenatal Vit-Fe Fumarate-FA (MULTIVITAMIN-PRENATAL) 27-0.8 MG TABS tablet Take 1 tablet by mouth daily. 30 each 9  09/15/2016  . promethazine (PHENERGAN) 25 MG tablet Take 0.5-1 tablets (12.5-25 mg total) by mouth every 6 (six) hours as needed. (Patient taking differently: Take 12.5-25 mg by mouth every 6 (six) hours as needed for nausea or vomiting. ) 30 tablet 0 08/24/2016    Review of Systems - Negative except mild irregular contractions  Vitals:  BP 122/68 (BP Location: Left Arm)   Pulse 81   Temp 98.3 F (36.8 C) (Oral)   Resp 18   LMP 01/22/2016   SpO2 98%  Physical Examination: CONSTITUTIONAL: Well-developed, well-nourished female in no acute distress.  HENT:  Normocephalic, atraumatic, External right and left ear normal. Oropharynx is clear and moist EYES: Conjunctivae and EOM are normal. Pupils are equal, round, and reactive to light. No scleral icterus.  NECK: Normal range of motion, supple, no masses SKIN: Skin is warm and dry. No rash noted. Not diaphoretic. No erythema. No pallor. NEUROLGIC: Alert and oriented to person, place, and time. Normal reflexes, muscle tone coordination. No cranial nerve deficit noted. PSYCHIATRIC: Normal mood and affect. Normal behavior. Normal judgment and thought content. CARDIOVASCULAR: Normal heart rate noted, regular rhythm RESPIRATORY: Effort normal, no problems with respiration noted ABDOMEN: Soft, nontender, nondistended, gravid. MUSCULOSKELETAL: Normal range of motion. No edema and no tenderness.    Membranes:intact Fetal Monitoring:Baseline: 150 bpm and Variability: Fair (1-6 bpm) Tocometer: Flat  Labs:  Results for orders placed or performed during the hospital encounter of 09/15/16 (from the past 24 hour(s))  Type and screen [facility]   Collection Time: 09/15/16  1:02 PM  Result Value Ref Range   ABO/RH(D) O POS    Antibody Screen NEG    Sample Expiration 09/18/2016     Imaging Studies: [place] Mfm Ob Transvaginal  Result Date: 09/15/2016 ----------------------------------------------------------------------  OBSTETRICS  REPORT                      (Signed Final 09/15/2016 12:07 pm) ---------------------------------------------------------------------- Patient Info  ID #:       [id number]                         D.O.B.:   [birth year] (31 yrs)  Name:  Tonya David              Visit Date:  09/15/2016 11:31 am ---------------------------------------------------------------------- Performed By  Performed By:     Emeline Darling BS,      Ref. Address:     [address]                                                             [place], [place]                                                             [address]  Attending:        Durwin Nora       Location:         [facility]                    MD  Referred By:      [name] MD ---------------------------------------------------------------------- Orders   #  Description                                 Code   1  [place] MFM OB FOLLOW UP                         [phone]   2  [place] MFM OB TRANSVAGINAL                      29528.4  ----------------------------------------------------------------------   #  Ordered By               Order #        Accession #    Episode #   1  MARK NEWMAN              [id number]      [id number]     [id number]   2  MARK NEWMAN              [id number]      [id number]     [id number]  ---------------------------------------------------------------------- Indications   [redacted] weeks gestation of pregnancy                Z3A.25   Pregnancy comlicated by subutex                O99.320, F11.20,   maintenance, antepartum                        Z79.891   Late prenatal  care, second trimester           O09.32   Poor obstetric history: Previous preterm       O09.219   delivery, antepartum x 4 (@ 36 weeks)   Smoking complicating pregnancy, second         O99.332   trimester  ---------------------------------------------------------------------- OB History  Blood Type:             Height:  5'6"   Weight (lb):  160      BMI:   25.82  Gravidity:    10        Term:   2        Prem:   4        SAB:   1  TOP:          2       Ectopic:  0        Living: 6 ---------------------------------------------------------------------- Fetal Evaluation  Num Of Fetuses:     1  Fetal Heart         134  Rate(bpm):  Cardiac Activity:   Observed  Presentation:       Cephalic  Placenta:           Posterior, low-lying,  P. Cord Insertion:  Previously Visualized  Amniotic Fluid  AFI FV:      Subjectively within normal limits                              Largest Pocket(cm)                              4.8 ---------------------------------------------------------------------- Biometry  BPD:      61.1  mm     G. Age:  24w 6d         31  %    CI:        71.66   %   70 - 86                                                          FL/HC:      20.8   %   18.7 - 20.3  HC:      229.8  mm     G. Age:  25w 0d         24  %    HC/AC:      1.11       1.04 - 1.22  AC:      207.1  mm     G. Age:  25w 2d         46  %    FL/BPD:     78.2   %   71 - 87  FL:       47.8  mm     G. Age:  26w 0d         63  %    FL/AC:      23.1   %   20 - 24  Est. FW:     816  gm    1 lb 13 oz      60  % ---------------------------------------------------------------------- Gestational Age  U/S Today:  25w 2d                                        EDD:   12/27/16  Best:          25w 1d    Det. ByMarcella Dubs         EDD:   12/28/16                                      (05/02/16) ---------------------------------------------------------------------- Anatomy  Cranium:               Appears normal         Aortic Arch:            Previously seen  Cavum:                 Appears normal         Ductal Arch:            Previously seen  Ventricles:            Appears normal         Diaphragm:              Appears normal  Choroid Plexus:        Previously seen        Stomach:                Appears normal, left                                                                         sided  Cerebellum:            Previously seen        Abdomen:                Appears normal  Posterior Fossa:       Previously seen        Abdominal Wall:         Previously seen  Nuchal Fold:           Previously seen        Cord Vessels:           Previously seen  Face:                  Orbits and profile     Kidneys:                Appear normal                         previously seen  Lips:                  Appears normal         Bladder:                Appears normal  Thoracic:              Appears normal  Spine:                  Previously seen  Heart:                 Appears normal         Upper Extremities:      Previously seen                         (4CH, axis, and situs  RVOT:                  Appears normal         Lower Extremities:      Previously seen  LVOT:                  Previously seen  Other:  Fetus appears to be a female. Heels previously visualized. Open          hands visualized. Nasal bone previously visualized. ---------------------------------------------------------------------- Cervix Uterus Adnexa  Cervix  Length:            1.5  cm.  Appears funnelled, see comments. ---------------------------------------------------------------------- Impression  SIUP at [redacted]w[redacted]d, subutex dependence  active singleton fetus  EFW 60th%'le  no dysmorphic features  leading edge of the placenta appears low-lying (<2cm from  internal os) but the patient has an apparent contraction in the  lower uterine segment that persists throughout the  examination  cervix is funneled, measuring short at 1.5cm ---------------------------------------------------------------------- Recommendations  Given patient report of cramping in setting of prior preterm  deliveries and a short funneled cervix, I recommend the  following:  -BMZ x 2 (antenatal corticosteroids)  -consider tocolysis (would be my recommendation)  -continue Makena weekly until 36 wks  -add prometrium 200mg  pv qhs until 36  wks provided she  does not progress to a dilated cervix (ie, to treat short cervix).  I spoke to the [facility] fellow to relay my recommendation for  continued evaluation in MAU and management of threatened  preterm labor.  Continue monthly interval growth given subutex maintenance. ----------------------------------------------------------------------               Durwin Nora, MD Electronically Signed Final Report   09/15/2016 12:07 pm ----------------------------------------------------------------------  [place] Mfm Ob Follow Up  Result Date: 09/15/2016 ----------------------------------------------------------------------  OBSTETRICS REPORT                      (Signed Final 09/15/2016 12:07 pm) ---------------------------------------------------------------------- Patient Info  ID #:       [id number]                         D.O.B.:   [birth year] (31 yrs)  Name:       Tonya David              Visit Date:  09/15/2016 11:31 am ---------------------------------------------------------------------- Performed By  Performed By:     Emeline Darling BS,      Ref. Address:     [address]                    [place]  [address]                                                             [place], [place]                                                             [address]  Attending:        Durwin Nora       Location:         [facility]                    MD  Referred By:      [name] MD ---------------------------------------------------------------------- Orders   #  Description                                 Code   1  [place] MFM OB FOLLOW UP                         [phone]   2  [place] MFM OB TRANSVAGINAL                      95621.3  ----------------------------------------------------------------------   #  Ordered By               Order #        Accession #    Episode #   1  MARK NEWMAN              [id number]      [id number]     [id number]   2  MARK NEWMAN               [id number]      [id number]     [id number]  ---------------------------------------------------------------------- Indications   [redacted] weeks gestation of pregnancy                Z3A.25   Pregnancy comlicated by subutex                O99.320, F11.20,   maintenance, antepartum                        Z79.891   Late prenatal care, second trimester           O09.32   Poor obstetric history: Previous preterm       O09.219   delivery, antepartum x 4 (@ 36 weeks)   Smoking complicating pregnancy, second         O99.332   trimester  ---------------------------------------------------------------------- OB History  Blood Type:            Height:  5'6"   Weight (lb):  160      BMI:   25.82  Gravidity:    10        Term:   2        Prem:   4        SAB:  1  TOP:          2       Ectopic:  0        Living: 6 ---------------------------------------------------------------------- Fetal Evaluation  Num Of Fetuses:     1  Fetal Heart         134  Rate(bpm):  Cardiac Activity:   Observed  Presentation:       Cephalic  Placenta:           Posterior, low-lying,  P. Cord Insertion:  Previously Visualized  Amniotic Fluid  AFI FV:      Subjectively within normal limits                              Largest Pocket(cm)                              4.8 ---------------------------------------------------------------------- Biometry  BPD:      61.1  mm     G. Age:  24w 6d         31  %    CI:        71.66   %   70 - 86                                                          FL/HC:      20.8   %   18.7 - 20.3  HC:      229.8  mm     G. Age:  25w 0d         24  %    HC/AC:      1.11       1.04 - 1.22  AC:      207.1  mm     G. Age:  25w 2d         46  %    FL/BPD:     78.2   %   71 - 87  FL:       47.8  mm     G. Age:  26w 0d         63  %    FL/AC:      23.1   %   20 - 24  Est. FW:     816  gm    1 lb 13 oz      60  % ---------------------------------------------------------------------- Gestational Age  U/S Today:     25w 2d                                         EDD:   12/27/16  Best:          25w 1d    Det. ByMarcella Dubs         EDD:   12/28/16                                      (05/02/16) ---------------------------------------------------------------------- Anatomy  Cranium:  Appears normal         Aortic Arch:            Previously seen  Cavum:                 Appears normal         Ductal Arch:            Previously seen  Ventricles:            Appears normal         Diaphragm:              Appears normal  Choroid Plexus:        Previously seen        Stomach:                Appears normal, left                                                                        sided  Cerebellum:            Previously seen        Abdomen:                Appears normal  Posterior Fossa:       Previously seen        Abdominal Wall:         Previously seen  Nuchal Fold:           Previously seen        Cord Vessels:           Previously seen  Face:                  Orbits and profile     Kidneys:                Appear normal                         previously seen  Lips:                  Appears normal         Bladder:                Appears normal  Thoracic:              Appears normal         Spine:                  Previously seen  Heart:                 Appears normal         Upper Extremities:      Previously seen                         (4CH, axis, and situs  RVOT:                  Appears normal         Lower Extremities:      Previously seen  LVOT:  Previously seen  Other:  Fetus appears to be a female. Heels previously visualized. Open          hands visualized. Nasal bone previously visualized. ---------------------------------------------------------------------- Cervix Uterus Adnexa  Cervix  Length:            1.5  cm.  Appears funnelled, see comments. ---------------------------------------------------------------------- Impression  SIUP at [redacted]w[redacted]d, subutex dependence  active singleton fetus  EFW 60th%'le  no  dysmorphic features  leading edge of the placenta appears low-lying (<2cm from  internal os) but the patient has an apparent contraction in the  lower uterine segment that persists throughout the  examination  cervix is funneled, measuring short at 1.5cm ---------------------------------------------------------------------- Recommendations  Given patient report of cramping in setting of prior preterm  deliveries and a short funneled cervix, I recommend the  following:  -BMZ x 2 (antenatal corticosteroids)  -consider tocolysis (would be my recommendation)  -continue Makena weekly until 36 wks  -add prometrium 200mg  pv qhs until 36 wks provided she  does not progress to a dilated cervix (ie, to treat short cervix).  I spoke to the [facility] fellow to relay my recommendation for  continued evaluation in MAU and management of threatened  preterm labor.  Continue monthly interval growth given subutex maintenance. ----------------------------------------------------------------------               Durwin Nora, MD Electronically Signed Final Report   09/15/2016 12:07 pm ----------------------------------------------------------------------    Assessment and Plan: Patient Active Problem List   Diagnosis Date Noted  . Preterm contractions 09/15/2016  . Short cervix in second trimester, antepartum 09/15/2016  . Recurrent UTI (urinary tract infection) complicating pregnancy 08/20/2016  . Buprenorphine maintenance treatment affecting pregnancy, antepartum (HCC) 08/15/2016  . ASCUS with positive high risk HPV cervical 08/02/2016  . UTI in pregnancy, antepartum, first trimester 12/08/2013  . History of preterm delivery 03/14/2012  . Short cervical length during pregnancy 03/14/2012  . Supervision of high risk pregnancy in second trimester 03/14/2012   Admit to Antenatal Routine antenatal care Tocolysis with Procardia and betamethasone ordered Scheryl Darter, MD Attending physician [facility], [facility]

## 2016-09-16 DIAGNOSIS — O99332 Smoking (tobacco) complicating pregnancy, second trimester: Secondary | ICD-10-CM | POA: Diagnosis present

## 2016-09-16 DIAGNOSIS — O26872 Cervical shortening, second trimester: Secondary | ICD-10-CM | POA: Diagnosis present

## 2016-09-16 DIAGNOSIS — Z3A25 25 weeks gestation of pregnancy: Secondary | ICD-10-CM | POA: Diagnosis not present

## 2016-09-16 DIAGNOSIS — F17210 Nicotine dependence, cigarettes, uncomplicated: Secondary | ICD-10-CM | POA: Diagnosis present

## 2016-09-16 MED ORDER — PROGESTERONE MICRONIZED 200 MG PO CAPS
200.0000 mg | ORAL_CAPSULE | Freq: Every day | ORAL | Status: DC
  Administered 2016-09-16: 200 mg via ORAL
  Filled 2016-09-16 (×2): qty 1

## 2016-09-16 MED ORDER — NICOTINE 7 MG/24HR TD PT24
7.0000 mg | MEDICATED_PATCH | Freq: Every day | TRANSDERMAL | Status: DC
  Administered 2016-09-16: 7 mg via TRANSDERMAL
  Filled 2016-09-16 (×2): qty 1

## 2016-09-16 NOTE — Progress Notes (Signed)
Patient ID: Tonya David, female   DOB: 07/31/1985, 31 y.o.   MRN: [id number] FACULTY PRACTICE ANTEPARTUM(COMPREHENSIVE) NOTE  Tonya David is a 31 y.o. [id number] with Estimated Date of Delivery: 12/28/16   By  early ultrasound 4742w2d  who is admitted for cervical shortening, possible uterine activity.    Fetal presentation is unsure. Length of Stay:  1  Days  Date of admission:09/15/2016  Subjective:  Patient reports the fetal movement as active. Patient reports uterine contraction  activity as none. Patient reports  vaginal bleeding as none. Patient describes fluid per vagina as None.  Vitals:  Blood pressure (!) 111/59, pulse 89, temperature 98.4 F (36.9 C), temperature source Oral, resp. rate 16, height 5\' 6"  (1.676 m), weight 160 lb (72.6 kg), last menstrual period 01/22/2016, SpO2 99 %. Vitals:   09/15/16 1950 09/15/16 2010 09/15/16 2325 09/16/16 0445  BP:  (!) 113/57 119/60 (!) 111/59  Pulse:  85 92 89  Resp:  18 16 16   Temp:  98.4 F (36.9 C) 98.6 F (37 C) 98.4 F (36.9 C)  TempSrc:  Oral Oral Oral  SpO2:   99% 99%  Weight: 160 lb (72.6 kg)     Height: 5\' 6"  (1.676 m)      Physical Examination:  General appearance - alert, well appearing, and in no distress Abdomen - soft, nontender, nondistended, no masses or organomegaly Fundal Height:  size equals dates Pelvic Exam:  normal external genitalia, vulva, vagina, cervix, uterus and adnexa, examination not indicated Cervical Exam: Not evaluated. Extremities: extremities normal, atraumatic, no cyanosis or edema with DTRs  Membranes:intact  Fetal Monitoring:  FHR 145 reassuring active fetus no decels     Labs:  Results for orders placed or performed during the hospital encounter of 09/15/16 (from the past 24 hour(s))  [facility]   Collection Time: 09/15/16  1:02 PM  Result Value Ref Range   ABO/RH(D) O POS    Antibody Screen NEG    Sample Expiration 09/18/2016   CBC   Collection Time: 09/15/16  4:51 PM  Result Value Ref Range   WBC 12.8 (H) 4.0 - 10.5 K/uL   RBC 3.22 (L) 3.87 - 5.11 MIL/uL   Hemoglobin 10.6 (L) 12.0 - 15.0 g/dL   HCT 41.330.6 (L) 24.436.0 - 01.046.0 %   MCV 95.0 78.0 - 100.0 fL   MCH 32.9 26.0 - 34.0 pg   MCHC 34.6 30.0 - 36.0 g/dL   RDW 27.212.7 53.611.5 - 64.415.5 %   Platelets 281 150 - 400 K/uL  Urinalysis, Routine w reflex microscopic   Collection Time: 09/15/16  8:00 PM  Result Value Ref Range   Color, Urine YELLOW YELLOW   APPearance HAZY (A) CLEAR   Specific Gravity, Urine 1.019 1.005 - 1.030   pH 7.0 5.0 - 8.0   Glucose, UA 50 (A) NEGATIVE mg/dL   Hgb urine dipstick NEGATIVE NEGATIVE   Bilirubin Urine NEGATIVE NEGATIVE   Ketones, ur NEGATIVE NEGATIVE mg/dL   Protein, ur NEGATIVE NEGATIVE mg/dL   Nitrite POSITIVE (A) NEGATIVE   Leukocytes, UA NEGATIVE NEGATIVE   RBC / HPF 0-5 0 - 5 RBC/hpf   WBC, UA 0-5 0 - 5 WBC/hpf   Bacteria, UA MANY (A) NONE SEEN   Squamous Epithelial / LPF 0-5 (A) NONE SEEN   Mucous PRESENT     Imaging Studies:      Medications:  Scheduled . betamethasone acetate-betamethasone sodium phosphate  12 mg Intramuscular Q24H  .  buprenorphine  4 mg Sublingual QHS  . buprenorphine  8 mg Sublingual BID  . docusate sodium  100 mg Oral Daily  . nicotine  7 mg Transdermal Daily  . NIFEdipine  20 mg Oral Q6H  . nitrofurantoin (macrocrystal-monohydrate)  100 mg Oral QHS  . prenatal multivitamin  1 tablet Oral Q1200  . progesterone  200 mg Oral QHS   I have reviewed the patient's current medications.  ASSESSMENT: [id number] [redacted]w[redacted]d Estimated Date of Delivery: 12/28/16  Patient Active Problem List   Diagnosis Date Noted  . Preterm contractions 09/15/2016  . Short cervix in second trimester, antepartum 09/15/2016  . Recurrent UTI (urinary tract infection) complicating pregnancy 08/20/2016  . Buprenorphine maintenance treatment affecting pregnancy, antepartum (HCC) 08/15/2016  . ASCUS with positive high risk HPV cervical  08/02/2016  . UTI in pregnancy, antepartum, first trimester 12/08/2013  . History of preterm delivery 03/14/2012  . Short cervical length during pregnancy 03/14/2012  . Supervision of high risk pregnancy in second trimester 03/14/2012    PLAN: Add prometrium Observe in house today, discontinue continuous monitoring 2nd dose betamethasone today Nicotine patch Possibly discharge tomorrow  Chaunda Vandergriff H 09/16/2016,7:40 AM

## 2016-09-17 LAB — CULTURE, BETA STREP (GROUP B ONLY)

## 2016-09-17 LAB — OB RESULTS CONSOLE GBS: GBS: NEGATIVE

## 2016-09-17 MED ORDER — PROGESTERONE MICRONIZED 200 MG PO CAPS: 200.0000 mg | ORAL_CAPSULE | Freq: Every day | ORAL | 1 refills | Status: DC

## 2016-09-17 MED ORDER — NIFEDIPINE 20 MG PO CAPS: 20.0000 mg | ORAL_CAPSULE | Freq: Four times a day (QID) | ORAL | 1 refills | Status: DC

## 2016-09-17 NOTE — Progress Notes (Signed)
Discharge teaching completed, reviewed new medication and administration schedule. Fetal monitoring completed for the morning and reviewed with secondary RN. Vitals Stable, Questions answered. Left ambulatory escorted by family and Nurse Tech.

## 2016-09-17 NOTE — Discharge Instructions (Signed)
Preterm Labor and Birth Information °Pregnancy normally lasts 39-41 weeks. Preterm labor is when labor starts early. It starts before you have been pregnant for 37 whole weeks. °What are the risk factors for preterm labor? °Preterm labor is more likely to occur in women who: °· Have an infection while pregnant. °· Have a cervix that is short. °· Have gone into preterm labor before. °· Have had surgery on their cervix. °· Are younger than age 29. °· Are older than age 35. °· Are African American. °· Are pregnant with two or more babies. °· Take street drugs while pregnant. °· Smoke while pregnant. °· Do not gain enough weight while pregnant. °· Got pregnant right after another pregnancy. °What are the symptoms of preterm labor? °Symptoms of preterm labor include: °· Cramps. The cramps may feel like the cramps some women get during their period. The cramps may happen with watery poop (diarrhea). °· Pain in the belly (abdomen). °· Pain in the lower back. °· Regular contractions or tightening. It may feel like your belly is getting tighter. °· Pressure in the lower belly that seems to get stronger. °· More fluid (discharge) leaking from the vagina. The fluid may be watery or bloody. °· Water breaking. °Why is it important to notice signs of preterm labor? °Babies who are born early may not be fully developed. They have a higher chance for: °· Long-term heart problems. °· Long-term lung problems. °· Trouble controlling body systems, like breathing. °· Bleeding in the brain. °· A condition called cerebral palsy. °· Learning difficulties. °· Death. °These risks are highest for babies who are born before 34 weeks of pregnancy. °How is preterm labor treated? °Treatment depends on: °· How long you were pregnant. °· Your condition. °· The health of your baby. °Treatment may involve: °· Having a stitch (suture) placed in your cervix. When you give birth, your cervix opens so the baby can come out. The stitch keeps the cervix  from opening too soon. °· Staying at the hospital. °· Taking or getting medicines, such as: °¨ Hormone medicines. °¨ Medicines to stop contractions. °¨ Medicines to help the baby’s lungs develop. °¨ Medicines to prevent your baby from having cerebral palsy. °What should I do if I am in preterm labor? °If you think you are going into labor too soon, call your doctor right away. °How can I prevent preterm labor? °· Do not use any tobacco products. °¨ Examples of these are cigarettes, chewing tobacco, and e-cigarettes. °¨ If you need help quitting, ask your doctor. °· Do not use street drugs. °· Do not use any medicines unless you ask your doctor if they are safe for you. °· Talk with your doctor before taking any herbal supplements. °· Make sure you gain enough weight. °· Watch for infection. If you think you might have an infection, get it checked right away. °· If you have gone into preterm labor before, tell your doctor. °This information is not intended to replace advice given to you by your health care provider. Make sure you discuss any questions you have with your health care provider. °Document Released: 09/22/2008 Document Revised: 12/07/2015 Document Reviewed: 11/17/2015 °Elsevier Interactive Patient Education © 2017 Elsevier Inc. ° °

## 2016-09-17 NOTE — Discharge Summary (Signed)
Physician Discharge Summary  Patient ID: Tonya David MRN: [id number] DOB/AGE: 29/11/1985 29 y.o.  Admit date: 09/15/2016 Discharge date: 09/17/2016  Admission Diagnoses:short cervix and threatened PTL  Discharge Diagnoses:  Active Problems:   Preterm contractions   Short cervix in second trimester, antepartum   Discharged Condition: good  Hospital Course: Tonya David is a 31 y.o. [id number] at 4137w1d admitted for Preterm labor, short cervix.  US in MFM today  Patient reports the fetal movement as active. Patient reports uterine contraction  activity as irregular,  Patient reports  vaginal bleeding as none. Patient describes fluid per vagina as None. Fetal presentation is cephalic.  Patient Active Problem List   Diagnosis Date Noted  . Preterm contractions 09/15/2016  . Short cervix in second trimester, antepartum 09/15/2016  . Recurrent UTI (urinary tract infection) complicating pregnancy 08/20/2016  . Buprenorphine maintenance treatment affecting pregnancy, antepartum (HCC) 08/15/2016  . ASCUS with positive high risk HPV cervical 08/02/2016  . UTI in pregnancy, antepartum, first trimester 12/08/2013  . History of preterm delivery 03/14/2012  . Short cervical length during pregnancy 03/14/2012  . Supervision of high risk pregnancy in second trimester 03/14/2012    Consults: None  Significant Diagnostic Studies: US showed short cervix and funneling  Treatments: IV hydration and procardia, prometrium  Discharge Exam: Blood pressure (!) 115/52, pulse 100, temperature 99 F (37.2 C), temperature source Oral, resp. rate 20, height 5\' 6"  (1.676 m), weight 72.6 kg (160 lb), last menstrual period 01/22/2016, SpO2 100 %. General appearance: alert, cooperative and no distress  Disposition: 01-Home or Self Care    Follow-up Information    [facility] [place] Follow up in 2 day(s).   Specialty:  Obstetrics and Gynecology Contact information: [address], [address] [place] [id number] [phone]          Signed: Scheryl DarterJames Arnold 09/17/2016, 7:47 AM

## 2016-09-18 ENCOUNTER — Telehealth: Payer: Self-pay | Admitting: Obstetrics and Gynecology

## 2016-09-18 LAB — CULTURE, OB URINE

## 2016-09-18 MED ORDER — PROGESTERONE MICRONIZED 200 MG PO CAPS: 200.0000 mg | ORAL_CAPSULE | Freq: Every day | ORAL | 1 refills | Status: DC

## 2016-09-18 MED ORDER — NIFEDIPINE 20 MG PO CAPS: 20.0000 mg | ORAL_CAPSULE | Freq: Four times a day (QID) | ORAL | 1 refills | Status: DC

## 2016-09-18 MED ORDER — CEPHALEXIN 500 MG PO CAPS: 500.0000 mg | ORAL_CAPSULE | Freq: Four times a day (QID) | ORAL | 3 refills | Status: DC

## 2016-09-18 NOTE — Telephone Encounter (Signed)
Patient admitted with concerns for pre-term labor from MFM. Given procardia and prometrium while inpatient. She has been unable to fill prescriptions. D/W patient and called several pharmacies. Found one with Prometrium in stock. Reordered prescription.  Patient also with positive urine culture on macrobid prophylaxis.  Treat with keflex as it is pansensitive e-coli and will change to keflex prophalyxis.

## 2016-09-19 ENCOUNTER — Ambulatory Visit (INDEPENDENT_AMBULATORY_CARE_PROVIDER_SITE_OTHER): Payer: Medicaid Other | Admitting: Obstetrics and Gynecology

## 2016-09-19 VITALS — BP 117/66 | HR 94 | Wt 158.0 lb

## 2016-09-19 DIAGNOSIS — O0992 Supervision of high risk pregnancy, unspecified, second trimester: Secondary | ICD-10-CM

## 2016-09-19 DIAGNOSIS — F1120 Opioid dependence, uncomplicated: Secondary | ICD-10-CM

## 2016-09-19 DIAGNOSIS — Z8751 Personal history of pre-term labor: Secondary | ICD-10-CM

## 2016-09-19 DIAGNOSIS — O99322 Drug use complicating pregnancy, second trimester: Secondary | ICD-10-CM

## 2016-09-19 DIAGNOSIS — O09212 Supervision of pregnancy with history of pre-term labor, second trimester: Secondary | ICD-10-CM | POA: Diagnosis not present

## 2016-09-19 DIAGNOSIS — Z79891 Long term (current) use of opiate analgesic: Secondary | ICD-10-CM

## 2016-09-19 DIAGNOSIS — O99320 Drug use complicating pregnancy, unspecified trimester: Secondary | ICD-10-CM

## 2016-09-19 MED ORDER — HYDROXYPROGESTERONE CAPROATE 250 MG/ML IM OIL
250.0000 mg | TOPICAL_OIL | Freq: Once | INTRAMUSCULAR | Status: AC
  Administered 2016-09-19: 250 mg via INTRAMUSCULAR

## 2016-09-19 MED ORDER — NIFEDIPINE ER OSMOTIC RELEASE 30 MG PO TB24: 30.0000 mg | ORAL_TABLET | Freq: Every day | ORAL | 3 refills | Status: DC

## 2016-09-19 NOTE — Progress Notes (Signed)
Pt states she had recent hospital admission. Pt would like to discuss Procardia, symptoms she is having.

## 2016-09-19 NOTE — Progress Notes (Signed)
   PRENATAL VISIT NOTE  Subjective:  Tonya David is a 31 y.o. [id number] at 6713w5d being seen today for ongoing prenatal care.  She is currently monitored for the following issues for this high-risk pregnancy and has History of preterm delivery; Short cervical length during pregnancy; Supervision of high risk pregnancy in second trimester; UTI in pregnancy, antepartum, first trimester; ASCUS with positive high risk HPV cervical; Buprenorphine maintenance treatment affecting pregnancy, antepartum (HCC); Recurrent UTI (urinary tract infection) complicating pregnancy; Preterm contractions; and Short cervix in second trimester, antepartum on her problem list.  Patient reports persistent abdominal cramping, unchanged since 3/9.  Contractions: Irritability. Vag. Bleeding: None.  Movement: Present. Denies leaking of fluid.   The following portions of the patient's history were reviewed and updated as appropriate: allergies, current medications, past family history, past medical history, past social history, past surgical history and problem list. Problem list updated.  Objective:   Vitals:   09/19/16 1436  BP: 117/66  Pulse: 94  Weight: 158 lb (71.7 kg)    Fetal Status: Fetal Heart Rate (bpm): 138   Movement: Present     General:  Alert, oriented and cooperative. Patient is in no acute distress.  Skin: Skin is warm and dry. No rash noted.   Cardiovascular: Normal heart rate noted  Respiratory: Normal respiratory effort, no problems with respiration noted  Abdomen: Soft, gravid, appropriate for gestational age. Pain/Pressure: Present     Pelvic:  Cervical exam deferred        Extremities: Normal range of motion.     Mental Status: Normal mood and affect. Normal behavior. Normal judgment and thought content.   Assessment and Plan:  Pregnancy: [id number] at 3013w5d  1. Supervision of high risk pregnancy in second trimester Patient reports persistent cramping unchanged in severity since her 3/9  admission Reviewed plan of care with the patient All questions were answered - US MFM OB FOLLOW UP; Future  2. Buprenorphine maintenance treatment affecting pregnancy, antepartum (HCC) Continue as prescribed  3. History of preterm delivery Patient completed BMZ course on 3/9 and 3/10 Advised to continue prometrium per vagina nightly  Continue procardia for tocolysis- dosing changed to daily (with BID option) as patient was having a hard time remembering to take it Discussed the benefits of continuing weekly 17-P injections- patient agreed to continue - US MFM OB FOLLOW UP; Future  Preterm labor symptoms and general obstetric precautions including but not limited to vaginal bleeding, contractions, leaking of fluid and fetal movement were reviewed in detail with the patient. Please refer to After Visit Summary for other counseling recommendations.  Return in about 2 weeks (around 10/03/2016) for ROB, 2 hr glucola next visit.   Catalina AntiguaPeggy Jonae Renshaw, MD

## 2016-09-20 ENCOUNTER — Other Ambulatory Visit: Payer: Self-pay | Admitting: General Practice

## 2016-09-20 ENCOUNTER — Encounter: Payer: Self-pay | Admitting: General Practice

## 2016-09-20 DIAGNOSIS — N3000 Acute cystitis without hematuria: Secondary | ICD-10-CM

## 2016-09-20 MED ORDER — NITROFURANTOIN MONOHYD MACRO 100 MG PO CAPS: 100.0000 mg | ORAL_CAPSULE | Freq: Two times a day (BID) | ORAL | 0 refills | Status: DC

## 2016-09-26 ENCOUNTER — Ambulatory Visit: Payer: Medicaid Other

## 2016-10-03 ENCOUNTER — Encounter: Payer: Medicaid Other | Admitting: Obstetrics & Gynecology

## 2016-10-03 ENCOUNTER — Other Ambulatory Visit: Payer: Medicaid Other

## 2016-10-06 ENCOUNTER — Encounter (HOSPITAL_COMMUNITY): Payer: Self-pay | Admitting: *Deleted

## 2016-10-06 ENCOUNTER — Inpatient Hospital Stay (HOSPITAL_COMMUNITY)
Admission: AD | Admit: 2016-10-06 | Discharge: 2016-10-06 | Disposition: A | Discharge status: Home / Self Care | Payer: Medicaid Other | Source: Ambulatory Visit | Attending: Obstetrics and Gynecology | Admitting: Obstetrics and Gynecology

## 2016-10-06 ENCOUNTER — Inpatient Hospital Stay (HOSPITAL_COMMUNITY): Payer: Medicaid Other

## 2016-10-06 DIAGNOSIS — O99320 Drug use complicating pregnancy, unspecified trimester: Secondary | ICD-10-CM

## 2016-10-06 DIAGNOSIS — F1120 Opioid dependence, uncomplicated: Secondary | ICD-10-CM | POA: Insufficient documentation

## 2016-10-06 DIAGNOSIS — O9989 Other specified diseases and conditions complicating pregnancy, childbirth and the puerperium: Secondary | ICD-10-CM

## 2016-10-06 DIAGNOSIS — O98513 Other viral diseases complicating pregnancy, third trimester: Secondary | ICD-10-CM | POA: Diagnosis not present

## 2016-10-06 DIAGNOSIS — R079 Chest pain, unspecified: Secondary | ICD-10-CM | POA: Insufficient documentation

## 2016-10-06 DIAGNOSIS — F17210 Nicotine dependence, cigarettes, uncomplicated: Secondary | ICD-10-CM | POA: Diagnosis not present

## 2016-10-06 DIAGNOSIS — Z3A28 28 weeks gestation of pregnancy: Secondary | ICD-10-CM | POA: Insufficient documentation

## 2016-10-06 DIAGNOSIS — R87810 Cervical high risk human papillomavirus (HPV) DNA test positive: Secondary | ICD-10-CM | POA: Insufficient documentation

## 2016-10-06 DIAGNOSIS — O99333 Smoking (tobacco) complicating pregnancy, third trimester: Secondary | ICD-10-CM | POA: Diagnosis not present

## 2016-10-06 DIAGNOSIS — R0789 Other chest pain: Secondary | ICD-10-CM

## 2016-10-06 DIAGNOSIS — K219 Gastro-esophageal reflux disease without esophagitis: Secondary | ICD-10-CM | POA: Insufficient documentation

## 2016-10-06 DIAGNOSIS — O26893 Other specified pregnancy related conditions, third trimester: Secondary | ICD-10-CM | POA: Diagnosis not present

## 2016-10-06 DIAGNOSIS — R87610 Atypical squamous cells of undetermined significance on cytologic smear of cervix (ASC-US): Secondary | ICD-10-CM

## 2016-10-06 DIAGNOSIS — Z79899 Other long term (current) drug therapy: Secondary | ICD-10-CM | POA: Insufficient documentation

## 2016-10-06 DIAGNOSIS — O99613 Diseases of the digestive system complicating pregnancy, third trimester: Secondary | ICD-10-CM | POA: Insufficient documentation

## 2016-10-06 LAB — URINALYSIS, ROUTINE W REFLEX MICROSCOPIC
Bilirubin Urine: NEGATIVE
GLUCOSE, UA: NEGATIVE mg/dL
Hgb urine dipstick: NEGATIVE
Ketones, ur: NEGATIVE mg/dL
Nitrite: POSITIVE — AB
PH: 7 (ref 5.0–8.0)
PROTEIN: NEGATIVE mg/dL
SPECIFIC GRAVITY, URINE: 1.017 (ref 1.005–1.030)

## 2016-10-06 MED ORDER — IOPAMIDOL (ISOVUE-370) INJECTION 76%
100.0000 mL | Freq: Once | INTRAVENOUS | Status: AC | PRN
  Administered 2016-10-06: 100 mL via INTRAVENOUS

## 2016-10-06 MED ORDER — GI COCKTAIL ~~LOC~~
30.0000 mL | Freq: Once | ORAL | Status: AC
  Administered 2016-10-06: 30 mL via ORAL
  Filled 2016-10-06: qty 30

## 2016-10-06 MED ORDER — FAMOTIDINE 20 MG PO TABS: 20.0000 mg | ORAL_TABLET | Freq: Every day | ORAL | 3 refills | Status: DC

## 2016-10-06 NOTE — MAU Provider Note (Signed)
History     CSN: [id number]  Arrival date and time: 10/06/16 4098   First Provider Initiated Contact with Patient 10/06/16 0130      No chief complaint on file.  Tonya David is a 31 y.o. [id number] at [redacted]w[redacted]d who presents today with chest pain. She states that 2 days ago she had pain in her thigh. Her boyfriend applied a heating pad and massaged her thigh. Then the left leg felt better. She states that on 3/28 she was at an appointment, and she started having chest pain. Today she woke up with cough, runny nose and congestion. She rates her pain 8/10 at this. She nothing makes it better. She has not taken anything for it at this time. She states that trying to take a deep breath, laying on left side, bending over quick movement coughing/laughing or touching it makes the pain worse. She is taking 17p and vaginal progesterone. She reports that she is on "bed rest" for this pregnancy. She denies any hx of blood clots or blood clots with any of her other pregnancies.    Past Medical History:  Diagnosis Date  . Anemia   . Headache(784.0)   . Preterm labor   . Urinary tract infection     Past Surgical History:  Procedure Laterality Date  . THERAPEUTIC ABORTION      Family History  Problem Relation Age of Onset  . Cancer Maternal Grandmother   . Cancer Maternal Grandfather   . Alcohol abuse Neg Hx   . Arthritis Neg Hx   . Asthma Neg Hx   . Birth defects Neg Hx   . COPD Neg Hx   . Depression Neg Hx   . Diabetes Neg Hx   . Drug abuse Neg Hx   . Early death Neg Hx   . Hearing loss Neg Hx   . Heart disease Neg Hx   . Hypertension Neg Hx   . Hyperlipidemia Neg Hx   . Kidney disease Neg Hx   . Learning disabilities Neg Hx   . Mental illness Neg Hx   . Mental retardation Neg Hx   . Miscarriages / Stillbirths Neg Hx   . Stroke Neg Hx   . Vision loss Neg Hx   . Other Neg Hx     Social History  Substance Use Topics  . Smoking status: Current Every Day Smoker    Packs/day:  0.25    Types: Cigarettes  . Smokeless tobacco: Current User  . Alcohol use Yes     Comment: occasionally    Allergies: No Known Allergies  Prescriptions Prior to Admission  Medication Sig Dispense Refill Last Dose  . buprenorphine (SUBUTEX) 8 MG SUBL SL tablet Place 8 mg under the tongue 3 (three) times daily. Pt states that she takes  in the am and  around 1400. Then  at bedtime.  0 Taking  . cephALEXin (KEFLEX) 500 MG capsule Take 1 capsule (500 mg total) by mouth 4 (four) times daily. For 7 days Then take nightly 42 capsule 3 Taking  . NIFEdipine (PROCARDIA-XL/ADALAT-CC/NIFEDICAL-XL) 30 MG 24 hr tablet Take 1 tablet (30 mg total) by mouth daily. Can increase to twice a day as needed for symptomatic contractions 30 tablet 3   . nitrofurantoin, macrocrystal-monohydrate, (MACROBID) 100 MG capsule Take 1 capsule (100 mg total) by mouth 2 (two) times daily. 14 capsule 0   . phenazopyridine (PYRIDIUM) 200 MG tablet Take 1 tablet (200 mg total) by mouth 3 (three) times daily  as needed for pain. 20 tablet 0 09/11/2016  . Prenatal Vit-Fe Fumarate-FA (MULTIVITAMIN-PRENATAL) 27-0.8 MG TABS tablet Take 1 tablet by mouth daily. 30 each 9 Taking  . progesterone (PROMETRIUM) 200 MG capsule Take 1 capsule (200 mg total) by mouth at bedtime. 30 capsule 1 Taking  . promethazine (PHENERGAN) 25 MG tablet Take 0.5-1 tablets (12.5-25 mg total) by mouth every 6 (six) hours as needed. (Patient taking differently: Take 12.5-25 mg by mouth every 6 (six) hours as needed for nausea or vomiting. ) 30 tablet 0 08/24/2016    Review of Systems  Constitutional: Negative for chills and fever.  Respiratory: Positive for shortness of breath.   Cardiovascular: Positive for chest pain.  Gastrointestinal: Negative for nausea and vomiting.   Physical Exam   Blood pressure 121/69, pulse (!) 110, temperature 98.8 F (37.1 C), temperature source Oral, resp. rate 20, height  (1.676 m), weight 161 lb (73 kg), last  menstrual period 01/22/2016, SpO2 100 %.  Physical Exam  Nursing note and vitals reviewed. Constitutional: She is oriented to person, place, and time. She appears well-developed and well-nourished. No distress.  HENT:  Head: Normocephalic.  Cardiovascular: Normal rate.   Respiratory: She exhibits no tenderness.  Patient with visible increased work of breathing   GI: Soft. There is no tenderness. There is no rebound.  Genitourinary:  Genitourinary Comments: Closed/thick/-3   Musculoskeletal:  Right calf measures 36 cm Left calf measures 36 cm Negative homans sign No redness or warmth on either leg.   Neurological: She is alert and oriented to person, place, and time.  Skin: Skin is warm and dry.  Psychiatric: She has a normal mood and affect.   Results for orders placed or performed during the hospital encounter of 10/06/16 (from the past 24 hour(s))  Urinalysis, Routine w reflex microscopic     Status: Abnormal   Collection Time: 10/06/16  1:10 AM  Result Value Ref Range   Color, Urine YELLOW YELLOW   APPearance HAZY (A) CLEAR   Specific Gravity, Urine 1.017 1.005 - 1.030   pH 7.0 5.0 - 8.0   Glucose, UA NEGATIVE NEGATIVE mg/dL   Hgb urine dipstick NEGATIVE NEGATIVE   Bilirubin Urine NEGATIVE NEGATIVE   Ketones, ur NEGATIVE NEGATIVE mg/dL   Protein, ur NEGATIVE NEGATIVE mg/dL   Nitrite POSITIVE (A) NEGATIVE   Leukocytes, UA SMALL (A) NEGATIVE   RBC / HPF 0-5 0 - 5 RBC/hpf   WBC, UA 6-30 0 - 5 WBC/hpf   Bacteria, UA FEW (A) NONE SEEN   Squamous Epithelial / LPF 6-30 (A) NONE SEEN   Mucous PRESENT     Ct Angio Chest Pe W And/or Wo Contrast  Result Date: 10/06/2016 CLINICAL DATA:  Chest pain and shortness of breath. Pregnant patient at 26 weeks just station. EXAM: CT ANGIOGRAPHY CHEST WITH CONTRAST TECHNIQUE: Multidetector CT imaging of the chest was performed using the standard protocol during bolus administration of intravenous contrast. Multiplanar CT image  reconstructions and MIPs were obtained to evaluate the vascular anatomy. CONTRAST:  100 cc Isovue 370 IV COMPARISON:  No recent comparisons. FINDINGS: Cardiovascular: There are no filling defects within the pulmonary arteries to suggest pulmonary embolus. No thoracic aortic dissection or aneurysm. The heart is normal in size. No pericardial fluid. Mediastinum/Nodes: No adenopathy.  The esophagus is decompressed. Lungs/Pleura: Lungs are clear. No consolidation, pulmonary edema or pleural fluid. Upper Abdomen: Normal. Musculoskeletal: Normal. Review of the MIP images confirms the above findings. IMPRESSION: Normal CTA of the chest.  No pulmonary embolus or acute intrathoracic abnormality. Electronically Signed   By: Rubye Oaks M.D.   On: 10/06/2016 04:43    EKG:NSR FHT: 135, moderate with 15x15 accels, no decels Toco: rare UC  MAU Course  Procedures  MDM 0156: D/W Dr. Jolayne Panther, will get CT chest and give GI cocktail. 0449:Patient is feeling better. Shortness of breath is improving.     Assessment and Plan   1. Gastroesophageal reflux disease, esophagitis presence not specified   2. Buprenorphine maintenance treatment affecting pregnancy, antepartum (HCC)   3. ASCUS with positive high risk HPV cervical   4. [redacted] weeks gestation of pregnancy   5. Other chest pain    DC home Comfort measures reviewed  3rd Trimester precautions  PTL precautions  Fetal kick counts RX: pepcid  QD #30 with 3RF  Return to MAU as needed FU with OB as planned  Follow-up Information    HARPER,CHARLES A, MD Follow up.   Specialty:  Obstetrics and Gynecology Contact information: [address] [place] [address] [phone]            [name] 10/06/2016, 1:31 AM

## 2016-10-06 NOTE — MAU Note (Signed)
PT  SAYS  ON Tuesday AT 0700-    LEFT LEG STARTED ACHING  AND  CRAMPING - SHARP  SHOOTING PAIN ALL DAY.        THEN WED AM - ALL OK.       THEN AT 4 PM ON WED SHE FELT SHARP SHOOTING  PAIN IN LEFT BREAST AND  ARMPIT-  WHILE AT DAUGHTER'S  DR.      THEN ON Thursday  STILL FELT BREAST PAIN  BUT NEVER WENT  AWAY-  STARTED COUGHING - RUNNY NOSE.   .     ABD PAIN STARTED   ON Tuesday .  [facility]- WITH [facility]-  DID  NOT  CALL DR.    SAYS HARD  TO TAKE  DEEP BREATH

## 2016-10-06 NOTE — Discharge Instructions (Signed)
Food Choices for Gastroesophageal Reflux Disease, Adult When you have gastroesophageal reflux disease (GERD), the foods you eat and your eating habits are very important. Choosing the right foods can help ease your discomfort. What guidelines do I need to follow?  Choose fruits, vegetables, whole grains, and low-fat dairy products.  Choose low-fat meat, fish, and poultry.  Limit fats such as oils, salad dressings, butter, nuts, and avocado.  Keep a food diary. This helps you identify foods that cause symptoms.  Avoid foods that cause symptoms. These may be different for everyone.  Eat small meals often instead of 3 large meals a day.  Eat your meals slowly, in a place where you are relaxed.  Limit fried foods.  Cook foods using methods other than frying.  Avoid drinking alcohol.  Avoid drinking large amounts of liquids with your meals.  Avoid bending over or lying down until 2-3 hours after eating. What foods are not recommended? These are some foods and drinks that may make your symptoms worse: Vegetables  Tomatoes. Tomato juice. Tomato and spaghetti sauce. Chili peppers. Onion and garlic. Horseradish. Fruits  Oranges, grapefruit, and lemon (fruit and juice). Meats  High-fat meats, fish, and poultry. This includes hot dogs, ribs, ham, sausage, salami, and bacon. Dairy  Whole milk and chocolate milk. Sour cream. Cream. Butter. Ice cream. Cream cheese. Drinks  Coffee and tea. Bubbly (carbonated) drinks or energy drinks. Condiments  Hot sauce. Barbecue sauce. Sweets/Desserts  Chocolate and cocoa. Donuts. Peppermint and spearmint. Fats and Oils  High-fat foods. This includes French fries and potato chips. Other  Vinegar. Strong spices. This includes black pepper, white pepper, red pepper, cayenne, curry powder, cloves, ginger, and chili powder. The items listed above may not be a complete list of foods and drinks to avoid. Contact your dietitian for more information.    This information is not intended to replace advice given to you by your health care provider. Make sure you discuss any questions you have with your health care provider. Document Released: 12/26/2011 Document Revised: 12/02/2015 Document Reviewed: 04/30/2013 Elsevier Interactive Patient Education  2017 Elsevier Inc.  

## 2016-10-15 ENCOUNTER — Inpatient Hospital Stay (HOSPITAL_COMMUNITY)
Admission: AD | Admit: 2016-10-15 | Discharge: 2016-10-15 | Disposition: A | Discharge status: Home / Self Care | Payer: Medicaid Other | Source: Ambulatory Visit | Attending: Obstetrics & Gynecology | Admitting: Obstetrics & Gynecology

## 2016-10-15 ENCOUNTER — Encounter (HOSPITAL_COMMUNITY): Payer: Self-pay

## 2016-10-15 DIAGNOSIS — F17210 Nicotine dependence, cigarettes, uncomplicated: Secondary | ICD-10-CM | POA: Diagnosis not present

## 2016-10-15 DIAGNOSIS — O99333 Smoking (tobacco) complicating pregnancy, third trimester: Secondary | ICD-10-CM | POA: Insufficient documentation

## 2016-10-15 DIAGNOSIS — F1120 Opioid dependence, uncomplicated: Secondary | ICD-10-CM

## 2016-10-15 DIAGNOSIS — R87610 Atypical squamous cells of undetermined significance on cytologic smear of cervix (ASC-US): Secondary | ICD-10-CM | POA: Insufficient documentation

## 2016-10-15 DIAGNOSIS — Z79899 Other long term (current) drug therapy: Secondary | ICD-10-CM | POA: Diagnosis not present

## 2016-10-15 DIAGNOSIS — O98513 Other viral diseases complicating pregnancy, third trimester: Secondary | ICD-10-CM | POA: Diagnosis not present

## 2016-10-15 DIAGNOSIS — O99320 Drug use complicating pregnancy, unspecified trimester: Secondary | ICD-10-CM

## 2016-10-15 DIAGNOSIS — Z3A29 29 weeks gestation of pregnancy: Secondary | ICD-10-CM | POA: Insufficient documentation

## 2016-10-15 DIAGNOSIS — O26873 Cervical shortening, third trimester: Secondary | ICD-10-CM | POA: Insufficient documentation

## 2016-10-15 DIAGNOSIS — R87810 Cervical high risk human papillomavirus (HPV) DNA test positive: Secondary | ICD-10-CM | POA: Insufficient documentation

## 2016-10-15 DIAGNOSIS — O479 False labor, unspecified: Secondary | ICD-10-CM | POA: Diagnosis not present

## 2016-10-15 DIAGNOSIS — O4700 False labor before 37 completed weeks of gestation, unspecified trimester: Secondary | ICD-10-CM

## 2016-10-15 LAB — URINALYSIS, ROUTINE W REFLEX MICROSCOPIC
Bilirubin Urine: NEGATIVE
GLUCOSE, UA: NEGATIVE mg/dL
Ketones, ur: NEGATIVE mg/dL
NITRITE: NEGATIVE
PROTEIN: 30 mg/dL — AB
Specific Gravity, Urine: 1.015 (ref 1.005–1.030)
pH: 6 (ref 5.0–8.0)

## 2016-10-15 MED ORDER — TERBUTALINE SULFATE 1 MG/ML IJ SOLN
0.2500 mg | Freq: Once | INTRAMUSCULAR | Status: AC
  Administered 2016-10-15: 0.25 mg via SUBCUTANEOUS
  Filled 2016-10-15: qty 1

## 2016-10-15 MED ORDER — LACTATED RINGERS IV SOLN
INTRAVENOUS | Status: DC
  Administered 2016-10-15: 17:00:00 via INTRAVENOUS

## 2016-10-15 NOTE — Discharge Instructions (Signed)
Preterm Labor and Birth Information °Pregnancy normally lasts 39-41 weeks. Preterm labor is when labor starts early. It starts before you have been pregnant for 37 whole weeks. °What are the risk factors for preterm labor? °Preterm labor is more likely to occur in women who: °· Have an infection while pregnant. °· Have a cervix that is short. °· Have gone into preterm labor before. °· Have had surgery on their cervix. °· Are younger than age 29. °· Are older than age 35. °· Are African American. °· Are pregnant with two or more babies. °· Take street drugs while pregnant. °· Smoke while pregnant. °· Do not gain enough weight while pregnant. °· Got pregnant right after another pregnancy. °What are the symptoms of preterm labor? °Symptoms of preterm labor include: °· Cramps. The cramps may feel like the cramps some women get during their period. The cramps may happen with watery poop (diarrhea). °· Pain in the belly (abdomen). °· Pain in the lower back. °· Regular contractions or tightening. It may feel like your belly is getting tighter. °· Pressure in the lower belly that seems to get stronger. °· More fluid (discharge) leaking from the vagina. The fluid may be watery or bloody. °· Water breaking. °Why is it important to notice signs of preterm labor? °Babies who are born early may not be fully developed. They have a higher chance for: °· Long-term heart problems. °· Long-term lung problems. °· Trouble controlling body systems, like breathing. °· Bleeding in the brain. °· A condition called cerebral palsy. °· Learning difficulties. °· Death. °These risks are highest for babies who are born before 34 weeks of pregnancy. °How is preterm labor treated? °Treatment depends on: °· How long you were pregnant. °· Your condition. °· The health of your baby. °Treatment may involve: °· Having a stitch (suture) placed in your cervix. When you give birth, your cervix opens so the baby can come out. The stitch keeps the cervix  from opening too soon. °· Staying at the hospital. °· Taking or getting medicines, such as: °¨ Hormone medicines. °¨ Medicines to stop contractions. °¨ Medicines to help the baby’s lungs develop. °¨ Medicines to prevent your baby from having cerebral palsy. °What should I do if I am in preterm labor? °If you think you are going into labor too soon, call your doctor right away. °How can I prevent preterm labor? °· Do not use any tobacco products. °¨ Examples of these are cigarettes, chewing tobacco, and e-cigarettes. °¨ If you need help quitting, ask your doctor. °· Do not use street drugs. °· Do not use any medicines unless you ask your doctor if they are safe for you. °· Talk with your doctor before taking any herbal supplements. °· Make sure you gain enough weight. °· Watch for infection. If you think you might have an infection, get it checked right away. °· If you have gone into preterm labor before, tell your doctor. °This information is not intended to replace advice given to you by your health care provider. Make sure you discuss any questions you have with your health care provider. °Document Released: 09/22/2008 Document Revised: 12/07/2015 Document Reviewed: 11/17/2015 °Elsevier Interactive Patient Education © 2017 Elsevier Inc. ° °

## 2016-10-15 NOTE — MAU Note (Signed)
Pt C/O uc's for the last 3 days, is on progesterone twice a day.  Denies bleeding or LOF.  Hx of PTD's.

## 2016-10-15 NOTE — MAU Provider Note (Signed)
Chief Complaint:  Contractions   First Provider Initiated Contact with Patient 10/15/16 1638     HPI: Tonya David is a 31 y.o. [id number] at 48w3dwho presents to maternity admissions reporting preterm contractions.  Has history of short cervix and previous 36 week deliveries. Is on prometrium and Procardia ER.  Took this morning  Cervix has been closed..Got Betamethasone on 09/15/16 She reports good fetal movement, denies LOF, vaginal bleeding, vaginal itching/burning, urinary symptoms, h/a, dizziness, n/v, diarrhea, constipation or fever/chills.    Abdominal Pain  This is a recurrent problem. The current episode started today. The onset quality is gradual. The problem occurs intermittently. The problem has been unchanged. The pain is located in the LLQ, RLQ and suprapubic region. The pain is mild. The quality of the pain is aching and cramping. The abdominal pain does not radiate. Pertinent negatives include no constipation, diarrhea, fever, headaches, myalgias, nausea or vomiting. Nothing aggravates the pain. The pain is relieved by nothing. Treatments tried: Procardia. The treatment provided mild relief.    RN Note: Pt C/O uc's for the last 3 days, is on progesterone twice a day.  Denies bleeding or LOF.  Hx of PTD's.  Past Medical History: Past Medical History:  Diagnosis Date  . Anemia   . Headache(784.0)   . Preterm labor   . Urinary tract infection     Past obstetric history: OB History  Gravida Para Term Preterm AB Living  SAB TAB Ectopic Multiple Live Births  1 2 0 0 6    # Outcome Date GA Lbr Len/2nd Weight Sex Delivery Anes PTL Lv  10 Current           9 Preterm 04/09/14 [redacted]w[redacted]d 01:00 / 00:05 5 lb 8.7 oz (2.515 kg) F Vag-Spont EPI  LIV  8 Term 06/14/12 [redacted]w[redacted]d 04:10 / 00:12 6 lb 6.8 oz (2.914 kg) F Vag-Spont EPI  LIV  7 Preterm 2011 [redacted]w[redacted]d   F Vag-Spont  Y LIV  6 Preterm 2009 [redacted]w[redacted]d   F Vag-Spont  Y LIV  5 Preterm 05/18/07 [redacted]w[redacted]d   M Vag-Spont   LIV  4 Term  2007 [redacted]w[redacted]d   M Vag-Spont  N LIV  3 SAB              Birth Comments: System Generated. Please review and update pregnancy details.  2 TAB           1 TAB               Past Surgical History: Past Surgical History:  Procedure Laterality Date  . THERAPEUTIC ABORTION      Family History: Family History  Problem Relation Age of Onset  . Cancer Maternal Grandmother   . Cancer Maternal Grandfather   . Alcohol abuse Neg Hx   . Arthritis Neg Hx   . Asthma Neg Hx   . Birth defects Neg Hx   . COPD Neg Hx   . Depression Neg Hx   . Diabetes Neg Hx   . Drug abuse Neg Hx   . Early death Neg Hx   . Hearing loss Neg Hx   . Heart disease Neg Hx   . Hypertension Neg Hx   . Hyperlipidemia Neg Hx   . Kidney disease Neg Hx   . Learning disabilities Neg Hx   . Mental illness Neg Hx   . Mental retardation Neg Hx   . Miscarriages / Stillbirths Neg Hx   . Stroke  Neg Hx   . Vision loss Neg Hx   . Other Neg Hx     Social History: Social History  Substance Use Topics  . Smoking status: Current Every Day Smoker    Packs/day: 0.25    Types: Cigarettes  . Smokeless tobacco: Current User  . Alcohol use Yes     Comment: occasionally    Allergies: No Known Allergies  Meds:  Prescriptions Prior to Admission  Medication Sig Dispense Refill Last Dose  . buprenorphine (SUBUTEX) 8 MG SUBL SL tablet Place 4-8 mg under the tongue 3 (three) times daily. Pt takes one tablet in the morning and at noon and one-half tablet at bedtime.  0 10/15/2016 at 0900  . cephALEXin (KEFLEX) 500 MG capsule Take 500 mg by mouth at bedtime.   10/14/2016 at Unknown time  . NIFEdipine (PROCARDIA-XL/ADALAT-CC/NIFEDICAL-XL) 30 MG 24 hr tablet Take 1 tablet (30 mg total) by mouth daily. Can increase to twice a day as needed for symptomatic contractions 30 tablet 3 10/15/2016 at Unknown time  . Prenatal Vit-Fe Fumarate-FA (MULTIVITAMIN-PRENATAL) 27-0.8 MG TABS tablet Take 1 tablet by mouth daily. 30 each 9 10/14/2016 at Unknown  time  . progesterone (PROMETRIUM) 200 MG capsule Take 1 capsule (200 mg total) by mouth at bedtime. (Patient taking differently: Place 200 mg vaginally at bedtime. ) 30 capsule 1 10/14/2016 at Unknown time  . promethazine (PHENERGAN) 25 MG tablet Take 0.5-1 tablets (12.5-25 mg total) by mouth every 6 (six) hours as needed. (Patient taking differently: Take 12.5-25 mg by mouth every 6 (six) hours as needed for nausea or vomiting. ) 30 tablet 0 Past Week at Unknown time    I have reviewed patient's Past Medical Hx, Surgical Hx, Family Hx, Social Hx, medications and allergies.   ROS:  Review of Systems  Constitutional: Negative for fever.  Gastrointestinal: Positive for abdominal pain. Negative for constipation, diarrhea, nausea and vomiting.  Musculoskeletal: Negative for myalgias.  Neurological: Negative for headaches.   Other systems negative  Physical Exam  Patient Vitals for the past 24 hrs:  BP Temp Temp src Pulse Resp SpO2  10/15/16 1700 - - - (!) 104 - 100 %  10/15/16 1655 - - - 99 - 99 %  10/15/16 1650 - - - (!) 109 - 100 %  10/15/16 1645 - - - (!) 104 - 98 %  10/15/16 1640 - - - (!) 104 - 99 %  10/15/16 1635 - - - (!) 105 - 99 %  10/15/16 1630 - - - 98 - 99 %  10/15/16 1617 133/71 98.6 F (37 C) Oral (!) 104 18 -   Constitutional: Well-developed, well-nourished female in no acute distress.  Cardiovascular: normal rate and rhythm Respiratory: normal effort, clear to auscultation bilaterally GI: Abd soft, non-tender, gravid appropriate for gestational age.   No rebound or guarding. MS: Extremities nontender, no edema, normal ROM Neurologic: Alert and oriented x 4.  GU: Neg CVAT.  PELVIC EXAM:  Dilation: 1 Effacement (%): 60 Station: Ballotable Exam by:: Artelia Laroche CNM  FHT:  Baseline 135 , moderate variability, accelerations present, no decelerations Contractions: q 3 mins Irregular     Labs: No results found for this or any previous visit (from the past 24  hour(s)). --/--/O POS (03/09 1302)  Imaging:  Ct Angio Chest Pe W And/or Wo Contrast  Result Date: 10/06/2016 CLINICAL DATA:  Chest pain and shortness of breath. Pregnant patient at 26 weeks just station. EXAM: CT ANGIOGRAPHY CHEST WITH CONTRAST TECHNIQUE:  Multidetector CT imaging of the chest was performed using the standard protocol during bolus administration of intravenous contrast. Multiplanar CT image reconstructions and MIPs were obtained to evaluate the vascular anatomy. CONTRAST:  100 cc Isovue 370 IV COMPARISON:  No recent comparisons. FINDINGS: Cardiovascular: There are no filling defects within the pulmonary arteries to suggest pulmonary embolus. No thoracic aortic dissection or aneurysm. The heart is normal in size. No pericardial fluid. Mediastinum/Nodes: No adenopathy.  The esophagus is decompressed. Lungs/Pleura: Lungs are clear. No consolidation, pulmonary edema or pleural fluid. Upper Abdomen: Normal. Musculoskeletal: Normal. Review of the MIP images confirms the above findings. IMPRESSION: Normal CTA of the chest. No pulmonary embolus or acute intrathoracic abnormality. Electronically Signed   By: Rubye Oaks M.D.   On: 10/06/2016 04:43    MAU Course/MDM: I have ordered labs and reviewed results.  NST reviewed Consult Dr Despina Hidden with presentation, exam findings and test results.  Treatments in MAU included IV hydration and Terbutaline, which mostly stopped contractions.    Assessment: 1. Buprenorphine maintenance treatment affecting pregnancy, antepartum (HCC)   2. ASCUS with positive high risk HPV cervical   3.     Preterm uterine contractions with slight cervical change, s/p betamethasone 4.     Known short cervix  Plan: Discharge home Preterm Labor precautions and fetal kick counts Follow up in Office for prenatal visits and recheck of status  Encouraged to return here or to other Urgent Care/ED if she develops worsening of symptoms, increase in pain, fever, or other  concerning symptoms.   Pt stable at time of discharge.  Wynelle Bourgeois CNM, MSN Certified Nurse-Midwife 10/15/2016 5:10 PM

## 2016-10-17 ENCOUNTER — Ambulatory Visit (HOSPITAL_COMMUNITY)
Admission: RE | Admit: 2016-10-17 | Discharge: 2016-10-17 | Disposition: A | Discharge status: Home / Self Care | Payer: Medicaid Other | Source: Ambulatory Visit | Attending: Obstetrics and Gynecology | Admitting: Obstetrics and Gynecology

## 2016-10-17 ENCOUNTER — Other Ambulatory Visit (HOSPITAL_COMMUNITY): Payer: Self-pay | Admitting: *Deleted

## 2016-10-17 ENCOUNTER — Encounter (HOSPITAL_COMMUNITY): Payer: Self-pay

## 2016-10-17 ENCOUNTER — Other Ambulatory Visit: Payer: Self-pay | Admitting: Obstetrics and Gynecology

## 2016-10-17 DIAGNOSIS — O99333 Smoking (tobacco) complicating pregnancy, third trimester: Secondary | ICD-10-CM | POA: Diagnosis not present

## 2016-10-17 DIAGNOSIS — O4440 Low lying placenta NOS or without hemorrhage, unspecified trimester: Secondary | ICD-10-CM

## 2016-10-17 DIAGNOSIS — O289 Unspecified abnormal findings on antenatal screening of mother: Secondary | ICD-10-CM | POA: Diagnosis not present

## 2016-10-17 DIAGNOSIS — O99320 Drug use complicating pregnancy, unspecified trimester: Secondary | ICD-10-CM

## 2016-10-17 DIAGNOSIS — O26873 Cervical shortening, third trimester: Secondary | ICD-10-CM | POA: Diagnosis not present

## 2016-10-17 DIAGNOSIS — O99323 Drug use complicating pregnancy, third trimester: Secondary | ICD-10-CM | POA: Diagnosis not present

## 2016-10-17 DIAGNOSIS — Z8751 Personal history of pre-term labor: Secondary | ICD-10-CM

## 2016-10-17 DIAGNOSIS — Z3A29 29 weeks gestation of pregnancy: Secondary | ICD-10-CM | POA: Diagnosis not present

## 2016-10-17 DIAGNOSIS — F1120 Opioid dependence, uncomplicated: Secondary | ICD-10-CM | POA: Diagnosis not present

## 2016-10-17 DIAGNOSIS — O09213 Supervision of pregnancy with history of pre-term labor, third trimester: Secondary | ICD-10-CM | POA: Insufficient documentation

## 2016-10-17 DIAGNOSIS — O0992 Supervision of high risk pregnancy, unspecified, second trimester: Secondary | ICD-10-CM

## 2016-10-17 DIAGNOSIS — O365920 Maternal care for other known or suspected poor fetal growth, second trimester, not applicable or unspecified: Secondary | ICD-10-CM

## 2016-10-26 ENCOUNTER — Inpatient Hospital Stay (HOSPITAL_COMMUNITY)
Admission: AD | Admit: 2016-10-26 | Discharge: 2016-10-27 | Disposition: A | Discharge status: Home / Self Care | Payer: Medicaid Other | Source: Ambulatory Visit | Attending: Obstetrics and Gynecology | Admitting: Obstetrics and Gynecology

## 2016-10-26 ENCOUNTER — Encounter (HOSPITAL_COMMUNITY): Payer: Self-pay | Admitting: *Deleted

## 2016-10-26 DIAGNOSIS — Z79899 Other long term (current) drug therapy: Secondary | ICD-10-CM | POA: Diagnosis not present

## 2016-10-26 DIAGNOSIS — F1120 Opioid dependence, uncomplicated: Secondary | ICD-10-CM | POA: Diagnosis not present

## 2016-10-26 DIAGNOSIS — O4703 False labor before 37 completed weeks of gestation, third trimester: Secondary | ICD-10-CM | POA: Diagnosis not present

## 2016-10-26 DIAGNOSIS — R87610 Atypical squamous cells of undetermined significance on cytologic smear of cervix (ASC-US): Secondary | ICD-10-CM

## 2016-10-26 DIAGNOSIS — R87810 Cervical high risk human papillomavirus (HPV) DNA test positive: Secondary | ICD-10-CM

## 2016-10-26 DIAGNOSIS — O26893 Other specified pregnancy related conditions, third trimester: Secondary | ICD-10-CM | POA: Insufficient documentation

## 2016-10-26 DIAGNOSIS — O99320 Drug use complicating pregnancy, unspecified trimester: Secondary | ICD-10-CM

## 2016-10-26 DIAGNOSIS — Z3A31 31 weeks gestation of pregnancy: Secondary | ICD-10-CM | POA: Diagnosis not present

## 2016-10-26 NOTE — MAU Note (Addendum)
PT  SAYS SHE FEELS  UC- STARTED  Tuesday  NIGHT .  PNC-  WITH  FAMINA .  LAST SEX-   FEB-  ON BEDREST.      TAKES  MEDS   FOR  UTI   .    ON PROCARDIA-       BID   ORALLY AND  PROGESTERONE    ONCE  VAG .        TODAY -  HAS TAKEN    BOTH PO    DOSES.

## 2016-10-27 DIAGNOSIS — O4703 False labor before 37 completed weeks of gestation, third trimester: Secondary | ICD-10-CM | POA: Diagnosis not present

## 2016-10-27 LAB — URINALYSIS, ROUTINE W REFLEX MICROSCOPIC
BILIRUBIN URINE: NEGATIVE
GLUCOSE, UA: NEGATIVE mg/dL
HGB URINE DIPSTICK: NEGATIVE
KETONES UR: NEGATIVE mg/dL
Nitrite: NEGATIVE
PH: 6 (ref 5.0–8.0)
PROTEIN: NEGATIVE mg/dL
Specific Gravity, Urine: 1.016 (ref 1.005–1.030)

## 2016-10-27 MED ORDER — TERBUTALINE SULFATE 1 MG/ML IJ SOLN
0.2500 mg | Freq: Once | INTRAMUSCULAR | Status: AC
  Administered 2016-10-27: 0.25 mg via SUBCUTANEOUS
  Filled 2016-10-27: qty 1

## 2016-10-27 MED ORDER — NIFEDIPINE ER OSMOTIC RELEASE 30 MG PO TB24: 30.0000 mg | ORAL_TABLET | Freq: Two times a day (BID) | ORAL | 3 refills | Status: DC

## 2016-10-27 MED ORDER — LACTATED RINGERS IV BOLUS (SEPSIS)
1000.0000 mL | Freq: Once | INTRAVENOUS | Status: AC
Start: 2016-10-27 — End: 2016-10-27
  Administered 2016-10-27: 1000 mL via INTRAVENOUS

## 2016-10-27 NOTE — MAU Note (Signed)
FFN collected by Herbert Seta, CNM.

## 2016-10-27 NOTE — MAU Provider Note (Signed)
History     CSN: [id number]  Arrival date and time: 10/26/16 2333   First Provider Initiated Contact with Patient 10/27/16 0026      Chief Complaint  Patient presents with  . Contractions  . Abdominal Pain   Tonya David is a 31 y.o. [id number] at [redacted]w[redacted]d who presents today with contractions. This has been an ongoing issue for her. She states that over the last 2 day they have increased in frequency, and they have not stopped with usual measures. She is taking procardia BID, but does occasionally miss doses. She took her last dose at 2100. She states that it did not seem to have an effect on the contractions. She is also taking vaginal progesterone. She has not taken her night time dose tonight because she usually takes it right before going to sleep. She denies any VB or LOF. She reports normal fetal movement. She had BMZ on 3/9 and 3/10.       Past Medical History:  Diagnosis Date  . Anemia   . Headache(784.0)   . Preterm labor   . Urinary tract infection     Past Surgical History:  Procedure Laterality Date  . THERAPEUTIC ABORTION      Family History  Problem Relation Age of Onset  . Cancer Maternal Grandmother   . Cancer Maternal Grandfather   . Alcohol abuse Neg Hx   . Arthritis Neg Hx   . Asthma Neg Hx   . Birth defects Neg Hx   . COPD Neg Hx   . Depression Neg Hx   . Diabetes Neg Hx   . Drug abuse Neg Hx   . Early death Neg Hx   . Hearing loss Neg Hx   . Heart disease Neg Hx   . Hypertension Neg Hx   . Hyperlipidemia Neg Hx   . Kidney disease Neg Hx   . Learning disabilities Neg Hx   . Mental illness Neg Hx   . Mental retardation Neg Hx   . Miscarriages / Stillbirths Neg Hx   . Stroke Neg Hx   . Vision loss Neg Hx   . Other Neg Hx     Social History  Substance Use Topics  . Smoking status: Current Every Day Smoker    Packs/day: 0.25    Types: Cigarettes  . Smokeless tobacco: Current User  . Alcohol use Yes     Comment: occasionally     Allergies: No Known Allergies  Prescriptions Prior to Admission  Medication Sig Dispense Refill Last Dose  . buprenorphine (SUBUTEX) 8 MG SUBL SL tablet Place 4-8 mg under the tongue 3 (three) times daily. Pt takes one tablet in the morning and at noon and one-half tablet at bedtime.  0 Taking  . cephALEXin (KEFLEX) 500 MG capsule Take 500 mg by mouth at bedtime.   Taking  . NIFEdipine (PROCARDIA-XL/ADALAT-CC/NIFEDICAL-XL) 30 MG 24 hr tablet Take 1 tablet (30 mg total) by mouth daily. Can increase to twice a day as needed for symptomatic contractions 30 tablet 3 Taking  . Prenatal Vit-Fe Fumarate-FA (MULTIVITAMIN-PRENATAL) 27-0.8 MG TABS tablet Take 1 tablet by mouth daily. 30 each 9 Taking  . progesterone (PROMETRIUM) 200 MG capsule Take 1 capsule (200 mg total) by mouth at bedtime. (Patient taking differently: Place 200 mg vaginally at bedtime. ) 30 capsule 1 Taking  . promethazine (PHENERGAN) 25 MG tablet Take 0.5-1 tablets (12.5-25 mg total) by mouth every 6 (six) hours as needed. (Patient taking differently: Take 12.5-25 mg  by mouth every 6 (six) hours as needed for nausea or vomiting. ) 30 tablet 0 Taking    Review of Systems  Constitutional: Negative for chills and fever.  Gastrointestinal: Negative for nausea and vomiting.  Genitourinary: Positive for pelvic pain. Negative for dysuria, frequency, urgency, vaginal bleeding and vaginal discharge.   Physical Exam   Blood pressure 139/77, pulse 93, temperature 98.8 F (37.1 C), temperature source Oral, resp. rate 18, height  (1.676 m), weight 162 lb 4 oz (73.6 kg), last menstrual period 01/22/2016, SpO2 100 %.  Physical Exam  Nursing note and vitals reviewed. Constitutional: She is oriented to person, place, and time. She appears well-developed and well-nourished. No distress.  HENT:  Head: Normocephalic.  Respiratory: Effort normal.  GI: Soft. There is no tenderness. There is no rebound.  Genitourinary:  Genitourinary  Comments: Cervix: 1/60/-2/vtx, no change from exam on 10/15/16   Neurological: She is alert and oriented to person, place, and time.  Skin: Skin is warm and dry.  Psychiatric: She has a normal mood and affect.   FHT: 130, moderate with 15x15 accels, no decels Toco about every 3-5 mins.  MAU Course  Procedures  MDM Patient given 0.25mg  terb and 1L of fluids.   2956: Ctx no longer tracing. Patient reports that she is not feeling contractions any longer. No cervical change. 1/60/ballotable   Assessment and Plan   1. Threatened premature labor in third trimester   2. Buprenorphine maintenance treatment affecting pregnancy, antepartum (HCC)   3. ASCUS with positive high risk HPV cervical   4. [redacted] weeks gestation of pregnancy    DC home Comfort measures reviewed  3rd Trimester precautions  PTL precautions  Fetal kick counts RX: procardia  XL BID #60 with 3RF  Return to MAU as needed FU with OB as planned  Follow-up Information    [facility] Follow up.   Contact information: [address] [place] [address] [phone]           [name] 10/27/2016, 12:38 AM

## 2016-10-27 NOTE — Discharge Instructions (Signed)
Preterm Labor and Birth Information °Pregnancy normally lasts 39-41 weeks. Preterm labor is when labor starts early. It starts before you have been pregnant for 37 whole weeks. °What are the risk factors for preterm labor? °Preterm labor is more likely to occur in women who: °· Have an infection while pregnant. °· Have a cervix that is short. °· Have gone into preterm labor before. °· Have had surgery on their cervix. °· Are younger than age 29. °· Are older than age 35. °· Are African American. °· Are pregnant with two or more babies. °· Take street drugs while pregnant. °· Smoke while pregnant. °· Do not gain enough weight while pregnant. °· Got pregnant right after another pregnancy. °What are the symptoms of preterm labor? °Symptoms of preterm labor include: °· Cramps. The cramps may feel like the cramps some women get during their period. The cramps may happen with watery poop (diarrhea). °· Pain in the belly (abdomen). °· Pain in the lower back. °· Regular contractions or tightening. It may feel like your belly is getting tighter. °· Pressure in the lower belly that seems to get stronger. °· More fluid (discharge) leaking from the vagina. The fluid may be watery or bloody. °· Water breaking. °Why is it important to notice signs of preterm labor? °Babies who are born early may not be fully developed. They have a higher chance for: °· Long-term heart problems. °· Long-term lung problems. °· Trouble controlling body systems, like breathing. °· Bleeding in the brain. °· A condition called cerebral palsy. °· Learning difficulties. °· Death. °These risks are highest for babies who are born before 34 weeks of pregnancy. °How is preterm labor treated? °Treatment depends on: °· How long you were pregnant. °· Your condition. °· The health of your baby. °Treatment may involve: °· Having a stitch (suture) placed in your cervix. When you give birth, your cervix opens so the baby can come out. The stitch keeps the cervix  from opening too soon. °· Staying at the hospital. °· Taking or getting medicines, such as: °¨ Hormone medicines. °¨ Medicines to stop contractions. °¨ Medicines to help the baby’s lungs develop. °¨ Medicines to prevent your baby from having cerebral palsy. °What should I do if I am in preterm labor? °If you think you are going into labor too soon, call your doctor right away. °How can I prevent preterm labor? °· Do not use any tobacco products. °¨ Examples of these are cigarettes, chewing tobacco, and e-cigarettes. °¨ If you need help quitting, ask your doctor. °· Do not use street drugs. °· Do not use any medicines unless you ask your doctor if they are safe for you. °· Talk with your doctor before taking any herbal supplements. °· Make sure you gain enough weight. °· Watch for infection. If you think you might have an infection, get it checked right away. °· If you have gone into preterm labor before, tell your doctor. °This information is not intended to replace advice given to you by your health care provider. Make sure you discuss any questions you have with your health care provider. °Document Released: 09/22/2008 Document Revised: 12/07/2015 Document Reviewed: 11/17/2015 °Elsevier Interactive Patient Education © 2017 Elsevier Inc. ° °

## 2016-11-01 ENCOUNTER — Ambulatory Visit (INDEPENDENT_AMBULATORY_CARE_PROVIDER_SITE_OTHER): Payer: Medicaid Other | Admitting: Obstetrics & Gynecology

## 2016-11-01 DIAGNOSIS — O09213 Supervision of pregnancy with history of pre-term labor, third trimester: Secondary | ICD-10-CM

## 2016-11-01 DIAGNOSIS — O0993 Supervision of high risk pregnancy, unspecified, third trimester: Secondary | ICD-10-CM

## 2016-11-01 DIAGNOSIS — O0992 Supervision of high risk pregnancy, unspecified, second trimester: Secondary | ICD-10-CM

## 2016-11-01 NOTE — Patient Instructions (Signed)
Postpartum Tubal Ligation Postpartum tubal ligation (PPTL) is a procedure to close the fallopian tubes. This is done so that you cannot get pregnant. When the fallopian tubes are closed, the eggs that the ovaries release cannot enter the uterus, and sperm cannot reach the eggs. PPTL is done right after childbirth or 1-2 days after childbirth, before the uterus returns to its normal location. PPTL is sometimes called "getting your tubes tied." You should not have this procedure if you want to get pregnant someday or if you are unsure about having more children. Tell a health care provider about:  Any allergies you have.  All medicines you are taking, including vitamins, herbs, eye drops, creams, and over-the-counter medicines.  Previous problems you or members of your family have had with the use of anesthetics.  Any blood disorders you have.  Previous surgeries you have had.  Any medical conditions you may have.  Any past pregnancies. What are the risks? Generally, this is a safe procedure. However, problems may occur, including:  Infection.  Bleeding.  Injury to surrounding organs.  Side effects from anesthetics.  Failure of the procedure. This procedure can increase your risk of a kind of pregnancy in which a fertilized egg attaches to the outside of the uterus (ectopic pregnancy). What happens before the procedure?  Ask your health care provider about:  How much pain you can expect to have.  What medicines you will be given for pain, especially if you are planning to breastfeed.  Follow instructions from your health care provider about eating and drinking restrictions. What happens during the procedure? If you had a vaginal delivery:  You may be given one or more of the following:  A medicine that helps you relax (sedative).  A medicine to numb the area (local anesthetic).  A medicine to make you fall asleep (general anesthetic).  A medicine that is injected into  an area of your body to numb everything below the injection site (regional anesthetic).  If you have been given a general anesthetic, a tube will be put down your throat to help you breathe.  An IV tube will be inserted into one of your veins to give you medicines and fluids during the procedure.  Your bladder may be emptied with a small tube (catheter).  An incision will be made just below your belly button.  Your fallopian tubes will be located and brought up through the incision.  Your fallopian tubes will be tied off, burned (cauterized), or blocked with a clip, ring, or clamp. A small portion in the center of each fallopian tube may be removed.  The incision will be closed with stitches (sutures).  A bandage (dressing) will be placed over the incision. If you had a cesarean delivery:  Tubal ligation will be done through the incision that was used for the cesarean delivery of your baby.  The incision will be closed with sutures.  A dressing will be placed over the incision. The procedure may vary among health care providers and hospitals. What happens after the procedure?  Your blood pressure, heart rate, breathing rate, and blood oxygen level will be monitored often until the medicines you were given have worn off.  You will be given pain medicine as needed.  Do not drive for 24 hours if you received a sedative. This information is not intended to replace advice given to you by your health care provider. Make sure you discuss any questions you have with your health care provider. Document   Released: 06/26/2005 Document Revised: 11/29/2015 Document Reviewed: 06/06/2015 Elsevier Interactive Patient Education  2017 [employer]  

## 2016-11-01 NOTE — Progress Notes (Signed)
Patient refused 17-p injection day . Due to severe side effects.

## 2016-11-01 NOTE — Progress Notes (Signed)
   PRENATAL VISIT NOTE  Subjective:  Tonya David is a 31 y.o. [id number] at [redacted]w[redacted]d being seen today for ongoing prenatal care.  She is currently monitored for the following issues for this high-risk pregnancy and has History of preterm delivery; Short cervical length during pregnancy; Supervision of high risk pregnancy in second trimester; UTI in pregnancy, antepartum, first trimester; ASCUS with positive high risk HPV cervical; Buprenorphine maintenance treatment affecting pregnancy, antepartum (HCC); Recurrent UTI (urinary tract infection) complicating pregnancy; Preterm contractions; and Short cervix in second trimester, antepartum on her problem list.  Patient reports occasional contractions.  Contractions: Irregular.  .  Movement: Present. Denies leaking of fluid.   The following portions of the patient's history were reviewed and updated as appropriate: allergies, current medications, past family history, past medical history, past social history, past surgical history and problem list. Problem list updated.  Objective:   Vitals:   11/01/16 1353  BP: 117/75  Pulse: (!) 101  Weight: 73.5 kg (162 lb 2 oz)    Fetal Status: Fetal Heart Rate (bpm): 129   Movement: Present     General:  Alert, oriented and cooperative. Patient is in no acute distress.  Skin: Skin is warm and dry. No rash noted.   Cardiovascular: Normal heart rate noted  Respiratory: Normal respiratory effort, no problems with respiration noted  Abdomen: Soft, gravid, appropriate for gestational age. Pain/Pressure: Present     Pelvic:  Cervical exam deferred        Extremities: Normal range of motion.  Edema: None  Mental Status: Normal mood and affect. Normal behavior. Normal judgment and thought content.   Assessment and Plan:  Pregnancy: [id number] at [redacted]w[redacted]d  1. Supervision of high risk pregnancy in second trimester Had MAU visit and cx was examined 4/19, stable. On Procardia. Declines Makena due to local  side-effects  Preterm labor symptoms and general obstetric precautions including but not limited to vaginal bleeding, contractions, leaking of fluid and fetal movement were reviewed in detail with the patient. Please refer to After Visit Summary for other counseling recommendations.  Return in about 2 weeks (around 11/15/2016).   Adam Phenix, MD

## 2016-11-14 ENCOUNTER — Ambulatory Visit (HOSPITAL_COMMUNITY)
Admission: RE | Admit: 2016-11-14 | Discharge: 2016-11-14 | Disposition: A | Discharge status: Home / Self Care | Payer: Medicaid Other | Source: Ambulatory Visit | Attending: Obstetrics and Gynecology | Admitting: Obstetrics and Gynecology

## 2016-11-14 ENCOUNTER — Other Ambulatory Visit (HOSPITAL_COMMUNITY): Payer: Self-pay | Admitting: Obstetrics and Gynecology

## 2016-11-14 ENCOUNTER — Encounter (HOSPITAL_COMMUNITY): Payer: Self-pay

## 2016-11-14 ENCOUNTER — Inpatient Hospital Stay (HOSPITAL_COMMUNITY)
Admission: AD | Admit: 2016-11-14 | Discharge: 2016-11-18 | DRG: 778 | Disposition: A | Discharge status: Home / Self Care | Payer: Medicaid Other | Source: Ambulatory Visit | Attending: Family Medicine | Admitting: Family Medicine

## 2016-11-14 DIAGNOSIS — Z87440 Personal history of urinary (tract) infections: Secondary | ICD-10-CM

## 2016-11-14 DIAGNOSIS — O99323 Drug use complicating pregnancy, third trimester: Secondary | ICD-10-CM

## 2016-11-14 DIAGNOSIS — O4693 Antepartum hemorrhage, unspecified, third trimester: Secondary | ICD-10-CM | POA: Diagnosis present

## 2016-11-14 DIAGNOSIS — R87810 Cervical high risk human papillomavirus (HPV) DNA test positive: Secondary | ICD-10-CM

## 2016-11-14 DIAGNOSIS — F1120 Opioid dependence, uncomplicated: Secondary | ICD-10-CM

## 2016-11-14 DIAGNOSIS — R87610 Atypical squamous cells of undetermined significance on cytologic smear of cervix (ASC-US): Secondary | ICD-10-CM

## 2016-11-14 DIAGNOSIS — O99320 Drug use complicating pregnancy, unspecified trimester: Secondary | ICD-10-CM

## 2016-11-14 DIAGNOSIS — O2341 Unspecified infection of urinary tract in pregnancy, first trimester: Secondary | ICD-10-CM | POA: Diagnosis present

## 2016-11-14 DIAGNOSIS — O26873 Cervical shortening, third trimester: Secondary | ICD-10-CM

## 2016-11-14 DIAGNOSIS — Z3A33 33 weeks gestation of pregnancy: Secondary | ICD-10-CM

## 2016-11-14 DIAGNOSIS — O0933 Supervision of pregnancy with insufficient antenatal care, third trimester: Secondary | ICD-10-CM

## 2016-11-14 DIAGNOSIS — O09219 Supervision of pregnancy with history of pre-term labor, unspecified trimester: Secondary | ICD-10-CM

## 2016-11-14 DIAGNOSIS — O365920 Maternal care for other known or suspected poor fetal growth, second trimester, not applicable or unspecified: Secondary | ICD-10-CM

## 2016-11-14 DIAGNOSIS — O09899 Supervision of other high risk pregnancies, unspecified trimester: Secondary | ICD-10-CM

## 2016-11-14 DIAGNOSIS — O289 Unspecified abnormal findings on antenatal screening of mother: Secondary | ICD-10-CM

## 2016-11-14 DIAGNOSIS — O99333 Smoking (tobacco) complicating pregnancy, third trimester: Secondary | ICD-10-CM

## 2016-11-14 DIAGNOSIS — F17210 Nicotine dependence, cigarettes, uncomplicated: Secondary | ICD-10-CM | POA: Diagnosis present

## 2016-11-14 LAB — URINALYSIS, ROUTINE W REFLEX MICROSCOPIC
Bilirubin Urine: NEGATIVE
Glucose, UA: NEGATIVE mg/dL
Ketones, ur: NEGATIVE mg/dL
Nitrite: NEGATIVE
PROTEIN: NEGATIVE mg/dL
SPECIFIC GRAVITY, URINE: 1.001 — AB (ref 1.005–1.030)
pH: 7 (ref 5.0–8.0)

## 2016-11-14 LAB — OB RESULTS CONSOLE GC/CHLAMYDIA: Gonorrhea: NEGATIVE

## 2016-11-14 MED ORDER — LACTATED RINGERS IV SOLN
INTRAVENOUS | Status: DC
  Administered 2016-11-14 – 2016-11-16 (×3): via INTRAVENOUS

## 2016-11-14 MED ORDER — ACETAMINOPHEN 325 MG PO TABS: 650.0000 mg | ORAL_TABLET | ORAL | Status: DC | PRN

## 2016-11-14 MED ORDER — PENICILLIN G POTASSIUM 5000000 UNITS IJ SOLR
5.0000 10*6.[IU] | Freq: Once | INTRAMUSCULAR | Status: AC
  Administered 2016-11-15: 5 10*6.[IU] via INTRAVENOUS
  Filled 2016-11-14: qty 5

## 2016-11-14 MED ORDER — MAGNESIUM SULFATE BOLUS VIA INFUSION
4.0000 g | Freq: Once | INTRAVENOUS | Status: AC
  Administered 2016-11-15: 4 g via INTRAVENOUS
  Filled 2016-11-14: qty 500

## 2016-11-14 MED ORDER — ZOLPIDEM TARTRATE 5 MG PO TABS
5.0000 mg | ORAL_TABLET | Freq: Every evening | ORAL | Status: DC | PRN
  Administered 2016-11-16: 5 mg via ORAL
  Filled 2016-11-14: qty 1

## 2016-11-14 MED ORDER — CALCIUM CARBONATE ANTACID 500 MG PO CHEW: 2.0000 | CHEWABLE_TABLET | ORAL | Status: DC | PRN

## 2016-11-14 MED ORDER — BUPRENORPHINE HCL 8 MG SL SUBL
8.0000 mg | SUBLINGUAL_TABLET | Freq: Three times a day (TID) | SUBLINGUAL | Status: DC
  Administered 2016-11-15 – 2016-11-18 (×11): 8 mg via SUBLINGUAL
  Filled 2016-11-14 (×11): qty 1

## 2016-11-14 MED ORDER — PRENATAL MULTIVITAMIN CH
1.0000 | ORAL_TABLET | Freq: Every day | ORAL | Status: DC
  Administered 2016-11-16: 1 via ORAL
  Filled 2016-11-14 (×2): qty 1

## 2016-11-14 MED ORDER — PENICILLIN G POT IN DEXTROSE 60000 UNIT/ML IV SOLN
3.0000 10*6.[IU] | INTRAVENOUS | Status: DC
  Administered 2016-11-15 – 2016-11-16 (×9): 3 10*6.[IU] via INTRAVENOUS
  Filled 2016-11-14 (×15): qty 50

## 2016-11-14 MED ORDER — DOCUSATE SODIUM 100 MG PO CAPS
100.0000 mg | ORAL_CAPSULE | Freq: Every day | ORAL | Status: DC
  Administered 2016-11-16: 100 mg via ORAL
  Filled 2016-11-14 (×2): qty 1

## 2016-11-14 MED ORDER — BETAMETHASONE SOD PHOS & ACET 6 (3-3) MG/ML IJ SUSP
12.0000 mg | INTRAMUSCULAR | Status: AC
  Administered 2016-11-15 (×2): 12 mg via INTRAMUSCULAR
  Filled 2016-11-14 (×2): qty 2

## 2016-11-14 MED ORDER — MAGNESIUM SULFATE 40 G IN LACTATED RINGERS - SIMPLE
2.0000 g/h | INTRAVENOUS | Status: DC
  Administered 2016-11-15 (×2): 2 g/h via INTRAVENOUS
  Filled 2016-11-14: qty 40
  Filled 2016-11-14: qty 500

## 2016-11-14 NOTE — H&P (Signed)
Tonya David is a 31 y.o. [id number] female at [redacted]w[redacted]d by 5wk ultrasound presenting with contractions and vaginal bleeding.  She states contractions started at 0800 but got worse at 2100. She says the contractions are every 2-5 minutes and she rates them a 6/10. She tried procardia this morning with no relief. She reports vaginal bleeding when she wiped after using the bathroom at 2215 and she passed a dime sized clot.  Reports active fetal movement, contractions: regular, every 2-5 minutes, vaginal bleeding: spotting, membranes: intact. Initiated prenatal care at [facility] for [employer] at 18 wks 6 days.   Most recent u/s 11/14/16. EFW: 2258 gm, 59%. AFI: 17. Presentation: vertex  This pregnancy complicated by: Short cervix, taking prometrium qHS Recurrent UTI, nightly suppression with Keflex Subutex in pregnancy 8mg  TID Limited prenatal care  Prenatal History/Complications:  History of 4 preterm deliveries  Past Medical History: Past Medical History:  Diagnosis Date  . Anemia   . Headache(784.0)   . Preterm labor   . Urinary tract infection     Past Surgical History: Past Surgical History:  Procedure Laterality Date  . THERAPEUTIC ABORTION      Obstetrical History: OB History    Gravida Para Term Preterm AB Living   10 6 2 4 3 6    SAB TAB Ectopic Multiple Live Births   1 2 0 0 6      Social History: Social History   Social History  . Marital status: Single    Spouse name: N/A  . Number of children: N/A  . Years of education: N/A   Social History Main Topics  . Smoking status: Current Every Day Smoker    Packs/day: 0.25    Types: Cigarettes  . Smokeless tobacco: Current User  . Alcohol use Yes     Comment: occasionally  . Drug use: No  . Sexual activity: Yes    Birth control/ protection: None   Other Topics Concern  . None   Social History Narrative  . None    Family History: Family History  Problem Relation Age of Onset  . Cancer  Maternal Grandmother   . Cancer Maternal Grandfather   . Alcohol abuse Neg Hx   . Arthritis Neg Hx   . Asthma Neg Hx   . Birth defects Neg Hx   . COPD Neg Hx   . Depression Neg Hx   . Diabetes Neg Hx   . Drug abuse Neg Hx   . Early death Neg Hx   . Hearing loss Neg Hx   . Heart disease Neg Hx   . Hypertension Neg Hx   . Hyperlipidemia Neg Hx   . Kidney disease Neg Hx   . Learning disabilities Neg Hx   . Mental illness Neg Hx   . Mental retardation Neg Hx   . Miscarriages / Stillbirths Neg Hx   . Stroke Neg Hx   . Vision loss Neg Hx   . Other Neg Hx     Allergies: No Known Allergies  Prescriptions Prior to Admission  Medication Sig Dispense Refill Last Dose  . buprenorphine (SUBUTEX) 8 MG SUBL SL tablet Place 4-8 mg under the tongue 3 (three) times daily. Pt takes one tablet in the morning and at noon and one-half tablet at bedtime.  0 Taking  . cephALEXin (KEFLEX) 500 MG capsule Take 500 mg by mouth at bedtime.   Taking  . NIFEdipine (PROCARDIA XL) 30 MG 24 hr tablet Take 1 tablet (30 mg  total) by mouth 2 (two) times daily. 60 tablet 3 Taking  . Prenatal Vit-Fe Fumarate-FA (MULTIVITAMIN-PRENATAL) 27-0.8 MG TABS tablet Take 1 tablet by mouth daily. 30 each 9 Taking  . progesterone (PROMETRIUM) 200 MG capsule Take 1 capsule (200 mg total) by mouth at bedtime. (Patient taking differently: Place 200 mg vaginally at bedtime. ) 30 capsule 1 Taking  . promethazine (PHENERGAN) 25 MG tablet Take 0.5-1 tablets (12.5-25 mg total) by mouth every 6 (six) hours as needed. (Patient taking differently: Take 12.5-25 mg by mouth every 6 (six) hours as needed for nausea or vomiting. ) 30 tablet 0 Taking    Review of Systems  Pertinent pos/neg as indicated in HPI  Blood pressure 136/84, pulse 88, temperature 99.1 F (37.3 C), temperature source Oral, resp. rate 18, last menstrual period 01/22/2016, SpO2 98 %. General appearance: alert, cooperative and no distress Lungs: clear to auscultation  bilaterally Heart: regular rate and rhythm Abdomen: gravid, soft, non-tender Extremities: no edema   Fetal monitoring: FHR: 120 bpm, variability: minimal ,  Accelerations: Present,  decelerations:  Absent Uterine activity: Frequency: Every 2 minutes and Duration: 60-80 seconds Speculum exam: scant bright red bleeding from cervical os, FFN not collected due to bleeding.  Dilation: 4 Effacement (%): 50 Station: -3 Exam by:: Cleone Slim (student NM)  Presentation: cephalic   Prenatal labs: ABO, Rh: --/--/O POS (03/09 1302) Antibody: NEG (03/09 1302) Rubella: !Error! RPR: Non Reactive (01/24 1412)  HBsAg: Negative (01/24 1412)  HIV: Non Reactive (01/24 1412)  GBS:   pending  1 hr Glucola: not done, limited prenatal care Genetic screening:  AFP negative Anatomy US: normal anatomy  Results for orders placed or performed during the hospital encounter of 11/14/16 (from the past 24 hour(s))  Urinalysis, Routine w reflex microscopic   Collection Time: 11/14/16 10:56 PM  Result Value Ref Range   Color, Urine YELLOW YELLOW   APPearance HAZY (A) CLEAR   Specific Gravity, Urine 1.001 (L) 1.005 - 1.030   pH 7.0 5.0 - 8.0   Glucose, UA NEGATIVE NEGATIVE mg/dL   Hgb urine dipstick LARGE (A) NEGATIVE   Bilirubin Urine NEGATIVE NEGATIVE   Ketones, ur NEGATIVE NEGATIVE mg/dL   Protein, ur NEGATIVE NEGATIVE mg/dL   Nitrite NEGATIVE NEGATIVE   Leukocytes, UA LARGE (A) NEGATIVE   RBC / HPF 0-5 0 - 5 RBC/hpf   WBC, UA 6-30 0 - 5 WBC/hpf   Bacteria, UA RARE (A) NONE SEEN   Squamous Epithelial / LPF 0-5 (A) NONE SEEN     Assessment:  [redacted]w[redacted]d SIUP  [id number]  Preterm labor  Cat II FHR  GBS in process     Hx of preterm birth x4  Subutex therapy  Limited prenatal care  Short cervix  Plan:  Admit to Antenatal  Mag 4g bolus then 2g per hour  Repeat BMZ x2  PCN for preterm labor with GBS unknown  Wet prep, GC/Chlamydia pending    Cleone Slim SNM 11/14/2016, 11:42 PM   I  spoke with and examined patient and agree with resident/PA/SNM's note and plan of care. [redacted]w[redacted]d SIUP w/ preterm labor. H/O spontaneous PTB x 4 ranging from 34-36wks. Was initially receiving 17P, stopped d/t pt reporting 'severe side effects'. Short dynamic cx, taking prometrium pv qhs, received BMZ 3/9 & 3/10. Subutex 8mg  TID. Limited pnc, has not had GTT. Recurrent uti's w/ keflex nightly suppression.  Exam: wet prep, gc/ct, gbs obtained- fFN not collected d/t scant brb at os SVE: 4/50/-3, vtx by Rayfield Citizen  Druscilla BrownieNeill, SNM Cat II FHR, minimal variability Discussed w/ Dr. Adrian BlackwaterStinson, admit as antenatal pt on L&D in case continues to progress Mag, BMZ x 2, PCN for GBS unknown/ptl Cheral MarkerKimberly R. Tonna Palazzi, CNM, [facility]NP-BC 11/15/2016 12:19 AM

## 2016-11-14 NOTE — Progress Notes (Signed)
G10P6 @ 33.[redacted] wksga. Presents to triage for contractions since 0800 and getting stronger. Denies LOF. Bleeding scant with pinkish discharge and a scant clot size of a pea.   Provider at bs assessing pt. wetprep, GC, GBS, and spec exam done. SVE 4/50/-3

## 2016-11-15 ENCOUNTER — Inpatient Hospital Stay (HOSPITAL_COMMUNITY): Payer: Medicaid Other | Admitting: Anesthesiology

## 2016-11-15 ENCOUNTER — Encounter (HOSPITAL_COMMUNITY): Payer: Self-pay | Admitting: Anesthesiology

## 2016-11-15 ENCOUNTER — Encounter: Payer: Medicaid Other | Admitting: Obstetrics & Gynecology

## 2016-11-15 LAB — RAPID URINE DRUG SCREEN, HOSP PERFORMED
Amphetamines: NOT DETECTED
BARBITURATES: NOT DETECTED
BENZODIAZEPINES: NOT DETECTED
COCAINE: NOT DETECTED
Opiates: NOT DETECTED
Tetrahydrocannabinol: NOT DETECTED

## 2016-11-15 LAB — WET PREP, GENITAL
SPERM: NONE SEEN
Trich, Wet Prep: NONE SEEN
Yeast Wet Prep HPF POC: NONE SEEN

## 2016-11-15 LAB — CBC
HEMATOCRIT: 30.8 % — AB (ref 36.0–46.0)
Hemoglobin: 10.6 g/dL — ABNORMAL LOW (ref 12.0–15.0)
MCH: 32.3 pg (ref 26.0–34.0)
MCHC: 34.4 g/dL (ref 30.0–36.0)
MCV: 93.9 fL (ref 78.0–100.0)
PLATELETS: 323 10*3/uL (ref 150–400)
RBC: 3.28 MIL/uL — ABNORMAL LOW (ref 3.87–5.11)
RDW: 13.1 % (ref 11.5–15.5)
WBC: 15.7 10*3/uL — AB (ref 4.0–10.5)

## 2016-11-15 LAB — GC/CHLAMYDIA PROBE AMP (~~LOC~~) NOT AT ARMC
Chlamydia: NEGATIVE
NEISSERIA GONORRHEA: NEGATIVE

## 2016-11-15 LAB — TYPE AND SCREEN
ABO/RH(D): O POS
ANTIBODY SCREEN: NEGATIVE

## 2016-11-15 LAB — GLUCOSE, CAPILLARY: GLUCOSE-CAPILLARY: 107 mg/dL — AB (ref 65–99)

## 2016-11-15 MED ORDER — FENTANYL CITRATE (PF) 100 MCG/2ML IJ SOLN
INTRAMUSCULAR | Status: AC
  Filled 2016-11-15: qty 2

## 2016-11-15 MED ORDER — FENTANYL 2.5 MCG/ML BUPIVACAINE 1/10 % EPIDURAL INFUSION (WH - ANES)
14.0000 mL/h | INTRAMUSCULAR | Status: DC | PRN
  Administered 2016-11-15 – 2016-11-16 (×4): 14 mL/h via EPIDURAL
  Filled 2016-11-15 (×3): qty 100

## 2016-11-15 MED ORDER — SOD CITRATE-CITRIC ACID 500-334 MG/5ML PO SOLN
30.0000 mL | ORAL | Status: DC | PRN
  Administered 2016-11-16: 30 mL via ORAL

## 2016-11-15 MED ORDER — EPHEDRINE 5 MG/ML INJ
10.0000 mg | INTRAVENOUS | Status: DC | PRN
  Filled 2016-11-15: qty 2

## 2016-11-15 MED ORDER — OXYCODONE-ACETAMINOPHEN 5-325 MG PO TABS: 1.0000 | ORAL_TABLET | ORAL | Status: DC | PRN

## 2016-11-15 MED ORDER — LACTATED RINGERS IV SOLN: 500.0000 mL | INTRAVENOUS | Status: DC | PRN

## 2016-11-15 MED ORDER — PHENYLEPHRINE 40 MCG/ML (10ML) SYRINGE FOR IV PUSH (FOR BLOOD PRESSURE SUPPORT)
PREFILLED_SYRINGE | INTRAVENOUS | Status: AC
  Filled 2016-11-15: qty 10

## 2016-11-15 MED ORDER — ACETAMINOPHEN 325 MG PO TABS
650.0000 mg | ORAL_TABLET | ORAL | Status: DC | PRN
  Administered 2016-11-17 (×2): 650 mg via ORAL
  Filled 2016-11-15 (×2): qty 2

## 2016-11-15 MED ORDER — PHENYLEPHRINE 40 MCG/ML (10ML) SYRINGE FOR IV PUSH (FOR BLOOD PRESSURE SUPPORT)
80.0000 ug | PREFILLED_SYRINGE | INTRAVENOUS | Status: DC | PRN
  Filled 2016-11-15: qty 5

## 2016-11-15 MED ORDER — DIPHENHYDRAMINE HCL 50 MG/ML IJ SOLN: 12.5000 mg | INTRAMUSCULAR | Status: DC | PRN

## 2016-11-15 MED ORDER — OXYTOCIN BOLUS FROM INFUSION: 500.0000 mL | Freq: Once | INTRAVENOUS | Status: DC

## 2016-11-15 MED ORDER — LACTATED RINGERS IV SOLN
INTRAVENOUS | Status: DC
  Administered 2016-11-16 (×2): via INTRAVENOUS

## 2016-11-15 MED ORDER — LACTATED RINGERS IV SOLN
500.0000 mL | Freq: Once | INTRAVENOUS | Status: AC
  Administered 2016-11-15: 500 mL via INTRAVENOUS

## 2016-11-15 MED ORDER — FENTANYL 2.5 MCG/ML BUPIVACAINE 1/10 % EPIDURAL INFUSION (WH - ANES)
INTRAMUSCULAR | Status: AC
  Filled 2016-11-15: qty 100

## 2016-11-15 MED ORDER — FENTANYL CITRATE (PF) 100 MCG/2ML IJ SOLN
50.0000 ug | Freq: Once | INTRAMUSCULAR | Status: AC
  Administered 2016-11-15: 100 ug via INTRAVENOUS

## 2016-11-15 MED ORDER — ONDANSETRON HCL 4 MG/2ML IJ SOLN: 4.0000 mg | Freq: Four times a day (QID) | INTRAMUSCULAR | Status: DC | PRN

## 2016-11-15 MED ORDER — OXYCODONE-ACETAMINOPHEN 5-325 MG PO TABS: 2.0000 | ORAL_TABLET | ORAL | Status: DC | PRN

## 2016-11-15 MED ORDER — OXYTOCIN 40 UNITS IN LACTATED RINGERS INFUSION - SIMPLE MED
2.5000 [IU]/h | INTRAVENOUS | Status: DC
  Filled 2016-11-15: qty 1000

## 2016-11-15 MED ORDER — LIDOCAINE HCL (PF) 1 % IJ SOLN
INTRAMUSCULAR | Status: DC | PRN
  Administered 2016-11-15: 6 mL via EPIDURAL
  Administered 2016-11-15: 5 mL via EPIDURAL

## 2016-11-15 MED ORDER — LIDOCAINE HCL (PF) 1 % IJ SOLN
30.0000 mL | INTRAMUSCULAR | Status: DC | PRN
  Filled 2016-11-15: qty 30

## 2016-11-15 NOTE — Progress Notes (Signed)
Subjective: Patient comfortable with epidural, sleeping.   Objective: Vitals:   11/15/16 0625 11/15/16 0631 11/15/16 0701 11/15/16 0731  BP:  130/70 124/75 130/64  Pulse: 80 87 80 86  Resp:  16 16 16   Temp:    97.9 F (36.6 C)  TempSrc:    Oral  SpO2:      Weight:      Height:       FHT:  FHR: 120 bpm, variability: moderate,  accelerations:  Present,  decelerations:  Absent UC:   regular, every 5 minutes SVE:   Dilation: 5 Effacement (%): 90 Station: -2 Exam by:: Antony OdeaNeil, Seena Ritacco, S-NM   Assessment / Plan: IUP at 33 weeks 6 days. Preterm labor. Manage expectantly.   Fetal Wellbeing:  Category I Pain Control:  Epidural Anticipated MOD:  NSVD  Cleone SlimCaroline Emmerson Taddei 11/15/2016, 7:59 AM

## 2016-11-15 NOTE — Anesthesia Preprocedure Evaluation (Signed)
Anesthesia Evaluation  Patient identified by MRN, date of birth, ID band Patient awake    Reviewed: Allergy & Precautions, H&P , NPO status , Patient's Chart, lab work & pertinent test results  Airway Mallampati: I  TM Distance: >3 FB Neck ROM: full    Dental no notable dental hx.    Pulmonary Current Smoker,    Pulmonary exam normal breath sounds clear to auscultation       Cardiovascular negative cardio ROS Normal cardiovascular exam Rhythm:regular Rate:Normal     Neuro/Psych negative psych ROS   GI/Hepatic negative GI ROS, Neg liver ROS,   Endo/Other  negative endocrine ROS  Renal/GU negative Renal ROS     Musculoskeletal   Abdominal Normal abdominal exam  (+)   Peds  Hematology   Anesthesia Other Findings   Reproductive/Obstetrics (+) Pregnancy                             Anesthesia Physical Anesthesia Plan  ASA: II  Anesthesia Plan: Epidural   Post-op Pain Management:    Induction:   Airway Management Planned:   Additional Equipment:   Intra-op Plan:   Post-operative Plan:   Informed Consent: I have reviewed the patients History and Physical, chart, labs and discussed the procedure including the risks, benefits and alternatives for the proposed anesthesia with the patient or authorized representative who has indicated his/her understanding and acceptance.     Plan Discussed with:   Anesthesia Plan Comments:         Anesthesia Quick Evaluation

## 2016-11-15 NOTE — Progress Notes (Cosign Needed)
Arvin CollardJammie L Weichert is a 31 y.o. [id number] at 6116w6d  admitted for Preterm labor  Subjective: Patient sleeping, comfortable with epidural. Reports some cramping and intermittent rectal pressure, same that she has had throughout the day.   Objective: Vitals:   11/15/16 1831 11/15/16 1901 11/15/16 1932 11/15/16 2001  BP: (!) 122/59 101/88 (!) 115/56 (!) 115/46  Pulse: 78 90 80 81  Resp: 18 18 18 16   Temp:   98.1 F (36.7 C)   TempSrc:   Oral   SpO2:      Weight:      Height:       Total I/O In: 725 [P.O.:100; I.V.:625] Out: 450 [Urine:450]  FHT:  FHR: 115 bpm, variability: moderate,  accelerations:  Present,  decelerations:  Absent UC:   irregular, every 3-10 minutes SVE:   Dilation: 6.5 Effacement (%): 80 Station: 0 Exam by:: diallo md, Anita rn   Assessment / Plan: IUP at 33 weeks 6 days. Preterm labor. On mag  Labor: continue to manage expectantly.  Fetal Wellbeing:  Category I Pain Control:  Epidural Anticipated MOD:  NSVD  Cleone SlimCaroline Cartel Mauss 11/15/2016, 9:01 PM

## 2016-11-15 NOTE — Anesthesia Procedure Notes (Signed)
Epidural Patient location during procedure: OB Start time: 11/15/2016 5:44 AM End time: 11/15/2016 5:46 AM  Staffing Anesthesiologist: Leilani AbleHATCHETT, Richelle Glick Performed: anesthesiologist   Preanesthetic Checklist Completed: patient identified, surgical consent, pre-op evaluation, timeout performed, IV checked, risks and benefits discussed and monitors and equipment checked  Epidural Patient position: sitting Prep: site prepped and draped and DuraPrep Patient monitoring: continuous pulse ox and blood pressure Approach: midline Location: L3-L4 Injection technique: LOR air  Needle:  Needle type: Tuohy  Needle gauge: 17 G Needle length: 9 cm and 9 Needle insertion depth: 6 cm Catheter type: closed end flexible Catheter size: 19 Gauge Catheter at skin depth: 11 cm Test dose: negative and Other  Assessment Sensory level: T9 Events: blood not aspirated, injection not painful, no injection resistance, negative IV test and no paresthesia  Additional Notes Reason for block:procedure for pain

## 2016-11-15 NOTE — Anesthesia Pain Management Evaluation Note (Signed)
  CRNA Pain Management Visit Note  Patient: Tonya David, 31 y.o., female  "Hello [facility]. We have an anesthesia team available at all times to provide care throughout the hospital, including epidural management and anesthesia for C-section. I don't know your plan for the delivery whether it a natural birth, water birth, IV sedation, nitrous supplementation, doula or epidural, but we want to meet your pain goals."   1.Was your pain managed to your expectations on prior hospitalizations?   Yes   2.What is your expectation for pain management during this hospitalization?     Epidural  3.How can we help you reach that goal? Epidural in place  Record the patient's initial score and the patient's pain goal.   Pain: 1  Pain Goal: 3 [facility] wants you to be able to say your pain was always managed very well.  Rocky Rishel 11/15/2016

## 2016-11-15 NOTE — Progress Notes (Signed)
LABOR PROGRESS NOTE  Arvin CollardJammie L Yau is a 31 y.o. [id number] at 6758w6d  admitted for SOL  Subjective: Patient is comfortable with epidural, reports contractions and mild pressure in her pelvis.  Objective: BP (!) 147/74 (BP Location: Right Arm)   Pulse 93   Temp 98.7 F (37.1 C) (Oral)   Resp 18   Ht 5\' 6"  (1.676 m)   Wt 164 lb (74.4 kg)   LMP 01/22/2016   SpO2 98%   BMI 26.47 kg/m  or  Vitals:   11/15/16 1402 11/15/16 1431 11/15/16 1501 11/15/16 1531  BP: 117/61 120/64 139/71 (!) 147/74  Pulse: 92 90 90 93  Resp: 18 16 16 18   Temp:      TempSrc:      SpO2:      Weight:      Height:        Dilation: 6.5 Effacement (%): 80 Cervical Position: Middle Station: 0 Presentation: Vertex Exam by:: Colletta Spillers md, Anita rn  Labs: Lab Results  Component Value Date   WBC 15.7 (H) 11/15/2016   HGB 10.6 (L) 11/15/2016   HCT 30.8 (L) 11/15/2016   MCV 93.9 11/15/2016   PLT 323 11/15/2016    Patient Active Problem List   Diagnosis Date Noted  . Preterm labor 11/15/2016  . Preterm contractions 09/15/2016  . Short cervix in second trimester, antepartum 09/15/2016  . Recurrent UTI (urinary tract infection) complicating pregnancy 08/20/2016  . Buprenorphine maintenance treatment affecting pregnancy, antepartum (HCC) 08/15/2016  . ASCUS with positive high risk HPV cervical 08/02/2016  . UTI in pregnancy, antepartum, first trimester 12/08/2013  . History of preterm delivery 03/14/2012  . Short cervical length during pregnancy 03/14/2012  . Supervision of high risk pregnancy in second trimester 03/14/2012    Assessment / Plan: 31 y.o. [id number] at 6958w6d here for SOL.   Labor: continue expectant management Fetal Wellbeing: Cat 1 Pain Control:  Epidural  Anticipated MOD: Vaginal   Lovena NeighboursAbdoulaye Marilla Boddy, MD 11/15/2016, 3:49 PM

## 2016-11-15 NOTE — Progress Notes (Signed)
Tonya David is a 31 y.o. [id number] at 765w6d admitted for Preterm labor  Subjective: Pt comfortable with epidural, reports some mild rectal pressure that is constant.  Family in room for support.  Objective: BP 127/89 (BP Location: Right Arm)   Pulse 92   Temp 97.9 F (36.6 C) (Oral)   Resp 18   Ht 5\' 6"  (1.676 m)   Wt 164 lb (74.4 kg)   LMP 01/22/2016   SpO2 98%   BMI 26.47 kg/m  I/O last 3 completed shifts: In: 1238.3 [I.V.:938.3; IV Piggyback:300] Out: 1325 [Urine:1325] Total I/O In: 420 [P.O.:120; I.V.:250; IV Piggyback:50] Out: -   FHT:  FHR: 125 bpm, variability: moderate,  accelerations:  Present,  decelerations:  Absent UC:   irregular, every 2-10 minutes SVE:   Dilation: 6 Effacement (%): 80 Station: Ballotable Exam by:: Leftwich-Kirby  Labs: Lab Results  Component Value Date   WBC 15.7 (H) 11/15/2016   HGB 10.6 (L) 11/15/2016   HCT 30.8 (L) 11/15/2016   MCV 93.9 11/15/2016   PLT 323 11/15/2016    Assessment / Plan: Preterm labor with advanced dilation GBS unknown  Labor: Progressing normally, Cervix changed from 5 to 6 cm but fetal head ballotable on this exam, pt comfortable with epidural. Since delivery is not imminent or inevitable, will continue expectant management and not augment labor at this time. Preeclampsia:  n/a Fetal Wellbeing:  Category I Pain Control:  Epidural I/D:  GBS unknown, on PCN Anticipated MOD:  NSVD  Sharen CounterLisa Leftwich-Kirby 11/15/2016, 10:03 AM

## 2016-11-15 NOTE — Progress Notes (Signed)
Subjective: Patient reporting more pain and pressure in her vagina. States contractions are stronger and IV fentanyl not helping. Patient also says she passed a quarter sized clot in the toilet.   Objective: Vitals:   11/15/16 0100 11/15/16 0201 11/15/16 0301 11/15/16 0401  BP: 125/74 128/68 131/75 123/66  Pulse: 96 91 87 81  Resp: 18 16 18 16   Temp:      TempSrc:      SpO2:      Weight:      Height:       Total I/O In: 988.3 [I.V.:688.3; IV Piggyback:300] Out: 975 [Urine:975]  FHT:  FHR: 115 bpm, variability: moderate,  accelerations:  Present,  decelerations:  Absent UC:   regular, every 2-3 minutes SVE:   Dilation: 5 Effacement (%): 90 Station: -2 Exam by:: Antony OdeaNeil, Jovanni Rash, S-NM   Labs: Lab Results  Component Value Date   WBC 15.7 (H) 11/15/2016   HGB 10.6 (L) 11/15/2016   HCT 30.8 (L) 11/15/2016   MCV 93.9 11/15/2016   PLT 323 11/15/2016    Assessment / Plan: IUP at 33 weeks 6 days. Preterm labor. Still contracting and making cervical change with bloody show despite mag and IV fentanyl. Patient can have epidural. Continue expectant management.   Labor: Manage expectantly Fetal Wellbeing:  Category I Pain Control:  Epidural Anticipated MOD:  NSVD  Cleone SlimCaroline Zimere Dunlevy SNM 11/15/2016, 5:21 AM

## 2016-11-16 LAB — COMPREHENSIVE METABOLIC PANEL
ALT: 9 U/L — ABNORMAL LOW (ref 14–54)
ANION GAP: 9 (ref 5–15)
AST: 13 U/L — ABNORMAL LOW (ref 15–41)
Albumin: 3.3 g/dL — ABNORMAL LOW (ref 3.5–5.0)
Alkaline Phosphatase: 102 U/L (ref 38–126)
CHLORIDE: 105 mmol/L (ref 101–111)
CO2: 23 mmol/L (ref 22–32)
Calcium: 8.3 mg/dL — ABNORMAL LOW (ref 8.9–10.3)
Creatinine, Ser: 0.44 mg/dL (ref 0.44–1.00)
GFR calc Af Amer: >60 mL/min (ref >60)
Glucose, Bld: 121 mg/dL — ABNORMAL HIGH (ref 65–99)
POTASSIUM: 3.5 mmol/L (ref 3.5–5.1)
Sodium: 137 mmol/L (ref 135–145)
Total Bilirubin: 0.5 mg/dL (ref 0.3–1.2)
Total Protein: 6.7 g/dL (ref 6.5–8.1)

## 2016-11-16 LAB — RPR: RPR Ser Ql: NONREACTIVE

## 2016-11-16 MED ORDER — BISACODYL 10 MG RE SUPP
10.0000 mg | Freq: Once | RECTAL | Status: AC
  Administered 2016-11-16: 10 mg via RECTAL
  Filled 2016-11-16: qty 1

## 2016-11-16 MED ORDER — DOCUSATE SODIUM 100 MG PO CAPS
100.0000 mg | ORAL_CAPSULE | Freq: Two times a day (BID) | ORAL | Status: DC | PRN
  Administered 2016-11-16: 100 mg via ORAL
  Filled 2016-11-16: qty 1

## 2016-11-16 MED ORDER — ZOLPIDEM TARTRATE 5 MG PO TABS
5.0000 mg | ORAL_TABLET | Freq: Every evening | ORAL | Status: DC | PRN
  Administered 2016-11-16 – 2016-11-18 (×2): 5 mg via ORAL
  Filled 2016-11-16 (×2): qty 1

## 2016-11-16 NOTE — Progress Notes (Signed)
Patient ID: [name], female   DOB: 12/22/1985, 31 y.o.   MRN: [id number]  S: Patient seen & examined for progress of labor. Patient comfortable with the epidural turned off. Feeling some cramping/contractions, but not severe pain, and not requesting for epidural to be turned back on. Patient would like to eat.    O:  Vitals:   11/16/16 1111 11/16/16 1202 11/16/16 1208 11/16/16 1301  BP: 133/75 140/85 140/85 (!) 143/71  Pulse: 92  86 92  Resp: 18 16  17   Temp:  98.3 F (36.8 C)    TempSrc:  Axillary    SpO2:      Weight:      Height:        Dilation: 4 Effacement (%): 90 Cervical Position: Middle Station: -2 Presentation: Vertex Exam by:: Cigi Bega, DO   FHT: 110 bpm, min to mod var, No accels, had a prolonged decel at 12:28, nadir to 85, lasted 3 minutes, but resolved and went back to baseline. Cat II FHT. TOCO: q2-703min- moderate intensity   A/P: 500cc bolus of fluid Continue observation Epidural off OK to eat light labor diet s/p BMZ and magnesium Continuous monitoring Will reevaluate this evening

## 2016-11-16 NOTE — Progress Notes (Signed)
OB Note CTSP for overall body pains/discomfort  Patient states she doesn't feel regular UCs but just overall body pressure and pain. No VB, LOF  Category I Toco: quiet NAD SVE: 4/50/high, no VB. Patient feels very constipated   Patient states she hasn't had BM in a few days and hasn't gotten much sleep.  D/c efm Dulcolax PR ambien qhs prn pt told to call us for any change in s/s. Pt is skinny and with negative toco and unchanged SVE, likely not PTL  Pt and partner amenable to plan.    Tonya David, Jr MD Attending [facility] 11/16/2016 Time: [phone]

## 2016-11-16 NOTE — Progress Notes (Signed)
Patient ID: Arvin CollardJammie L Servidio, female   DOB: 07/06/1986, 31 y.o.   MRN: [id number]  S: Patient seen & examined for progress of labor. Patient comfortable with epidural.    O:  Vitals:   11/16/16 0831 11/16/16 0901 11/16/16 0931 11/16/16 1001  BP: 137/78 119/62 (!) 138/93 126/64  Pulse: 95 85 99 85  Resp: 17 17 18 16   Temp:      TempSrc:      SpO2:      Weight:      Height:        Dilation: 4 Effacement (%): 90 Cervical Position: Middle Station: -2 Presentation: Vertex Exam by:: Jen MowElizabeth Mumaw, DO   FHT: 110 bpm, mod var, occasional accels, no decels  (Cat II) TOCO: Irregular, >10 min apart   A/P: Due to no change in cervix and contractions have decreased, will stop epidural medication, recheck at 1PM, and if still no change, will allow patient to eat and move her up to the 3rd floor for antepartum observation. D/C'd magnesium as patient has received both BMZ doses and is 34 weeks.

## 2016-11-16 NOTE — Progress Notes (Signed)
Subjective: Patient called out reporting intense and constant rectal pressure with a sudden onset.   Objective: Vitals:   11/15/16 2231 11/15/16 2301 11/15/16 2331 11/16/16 0001  BP: 136/75 128/76 133/81 (!) 119/97  Pulse: 90 98 96 79  Resp: 16 18 16 16   Temp:    98.2 F (36.8 C)  TempSrc:    Axillary  SpO2:      Weight:      Height:       Total I/O In: 2042 [P.O.:817; I.V.:1125; IV Piggyback:100] Out: 2250 [Urine:2250]  FHT:  FHR: 115 bpm, variability: moderate,  accelerations:  Present,  decelerations:  Present variable UC:   irregular, every 3-5 minutes SVE:   Dilation: 6.5 Effacement (%): 80 Station: -1 Exam by:: Antony OdeaNeil, Efraim Vanallen S-NM   Assessment / Plan: IUP at 34 weeks. Preterm labor. Cervix unchanged  Reassurance provided. Continue to manage expectantly.   Tonya David 11/16/2016, 12:40 AM

## 2016-11-17 LAB — URINE CULTURE: Special Requests: NORMAL

## 2016-11-17 LAB — GLUCOSE, CAPILLARY
GLUCOSE-CAPILLARY: 121 mg/dL — AB (ref 65–99)
Glucose-Capillary: 101 mg/dL — ABNORMAL HIGH (ref 65–99)
Glucose-Capillary: 107 mg/dL — ABNORMAL HIGH (ref 65–99)
Glucose-Capillary: 109 mg/dL — ABNORMAL HIGH (ref 65–99)

## 2016-11-17 LAB — CULTURE, BETA STREP (GROUP B ONLY): SPECIAL REQUESTS: NORMAL

## 2016-11-17 MED ORDER — PRENATAL MULTIVITAMIN CH
1.0000 | ORAL_TABLET | Freq: Every day | ORAL | Status: DC
  Administered 2016-11-17: 1 via ORAL
  Filled 2016-11-17: qty 1

## 2016-11-17 MED ORDER — CEPHALEXIN 500 MG PO CAPS: 500.0000 mg | ORAL_CAPSULE | Freq: Every day | ORAL | Status: DC

## 2016-11-17 MED ORDER — CEPHALEXIN 500 MG PO CAPS
500.0000 mg | ORAL_CAPSULE | Freq: Four times a day (QID) | ORAL | Status: DC
  Administered 2016-11-17 (×3): 500 mg via ORAL
  Filled 2016-11-17 (×10): qty 1

## 2016-11-17 MED ORDER — CALCIUM CARBONATE ANTACID 500 MG PO CHEW
1.0000 | CHEWABLE_TABLET | Freq: Two times a day (BID) | ORAL | Status: DC
  Administered 2016-11-17: 200 mg via ORAL
  Filled 2016-11-17 (×2): qty 1

## 2016-11-17 MED ORDER — POLYETHYLENE GLYCOL 3350 17 G PO PACK
17.0000 g | PACK | Freq: Every day | ORAL | Status: DC
  Administered 2016-11-17: 17 g via ORAL
  Filled 2016-11-17: qty 1

## 2016-11-17 NOTE — Anesthesia Postprocedure Evaluation (Signed)
Anesthesia Post Note  Patient: Tonya CollardJammie L David  Procedure(s) Performed: * No procedures listed *  Patient location during evaluation: [facility] Anesthesia Type: Epidural Level of consciousness: awake Pain management: pain level controlled Vital Signs Assessment: post-procedure vital signs reviewed and stable Respiratory status: spontaneous breathing Cardiovascular status: stable Postop Assessment: no headache, no backache, epidural receding, patient able to bend at knees, no signs of nausea or vomiting and adequate PO intake Anesthetic complications: no        Last Vitals:  Vitals:   11/17/16 1135 11/17/16 1545  BP: 128/68 (!) 111/52  Pulse: 91 82  Resp: 18 18  Temp: 37.3 C 36.9 C    Last Pain:  Vitals:   11/17/16 1700  TempSrc:   PainSc: Asleep   Pain Goal: Patients Stated Pain Goal: 1 (11/17/16 1100)               Josselin Gaulin

## 2016-11-17 NOTE — Progress Notes (Signed)
Daily Antepartum Note  Admission Date: 11/14/2016 Current Date: 11/17/2016 7:31 AM  Tonya David is a 31 y.o. [id number] @ 6063w1d, HD#4, admitted for PTL, UTI.  Pregnancy complicated by: Insufficient PNC, h/o PTB x 3 (pt self d/c'ed 17p this pregnancy), shortening cervix this pregnancy, subutex use, recurrent UTIs, ?gHTN  Overnight/24hr events:  none  Subjective:  Pt slept fine. ?decreased FM this AM. No PTL s/s  Objective:    Current Vital Signs 24h Vital Sign Ranges  T 98.3 F (36.8 C) Temp  Avg: 98.3 F (36.8 C)  Min: 98.2 F (36.8 C)  Max: 98.5 F (36.9 C)  BP  (asleep) BP  Min: 112/64  Max: 143/71  HR 82 Pulse  Avg: 89.1  Min: 82  Max: 100  RR 18 Resp  Avg: 17.6  Min: 16  Max: 20  SaO2 100 % Not Delivered SpO2  Avg: 98.7 %  Min: 97 %  Max: 100 %       24 Hour I/O Current Shift I/O  Time Ins Outs 05/10 0701 - 05/11 0700 In: 970 [P.O.:720; I.V.:250] Out: 2975 [Urine:2975] No intake/output data recorded.   Patient Vitals for the past 24 hrs:  BP Temp Temp src Pulse Resp SpO2  11/17/16 0030 - - - - 18 -  11/17/16 0000 118/62 98.3 F (36.8 C) Oral 82 18 100 %  11/16/16 2300 - - - - 18 -  11/16/16 2143 - - - - 18 -  11/16/16 2010 (!) 114/58 98.2 F (36.8 C) Oral 88 20 99 %  11/16/16 1920 - - - - 18 -  11/16/16 1855 - - - 90 18 97 %  11/16/16 1823 - - - - 18 -  11/16/16 1718 - - - - 18 -  11/16/16 1645 - - - - 18 -  11/16/16 1552 131/65 98.3 F (36.8 C) Oral 86 - -  11/16/16 1458 112/64 - - 84 16 -  11/16/16 1405 131/71 - - 83 16 -  11/16/16 1301 (!) 143/71 - - 92 17 -  11/16/16 1208 140/85 - - 86 - -  11/16/16 1202 140/85 98.3 F (36.8 C) Axillary - 16 -  11/16/16 1111 133/75 - - 92 18 -  11/16/16 1001 126/64 - - 85 16 -  11/16/16 0931 (!) 138/93 - - 99 18 -  11/16/16 0901 119/62 - - 85 17 -  11/16/16 0831 137/78 - - 95 17 -  11/16/16 0801 128/69 - - 100 18 -  11/16/16 0739 - 98.5 F (36.9 C) Axillary - - -    Physical exam: General: Well nourished,  well developed female in no acute distress. Abdomen: gravid, nttp Cardiovascular: S1, S2 normal, no murmur, rub or gallop, regular rate and rhythm Respiratory: CTAB Extremities: no clubbing, cyanosis or edema Skin: Warm and dry.   Medications: Current Facility-Administered Medications  Medication Dose Route Frequency Provider Last Rate Last Dose  . acetaminophen (TYLENOL) tablet 650 mg  650 mg Oral Q4H PRN Leftwich-Kirby, Wilmer FloorLisa A, CNM      . buprenorphine (SUBUTEX) sublingual tablet 8 mg  8 mg Sublingual TID Cheral MarkerBooker, Kimberly R, CNM   8 mg at 11/16/16 2143  . cephALEXin (KEFLEX) capsule 500 mg  500 mg Oral QHS [place] BingPickens, Tonya Walgren, MD      . lactated ringers infusion 500-1,000 mL  500-1,000 mL Intravenous PRN Leftwich-Kirby, Lisa A, CNM      . lidocaine (PF) (XYLOCAINE) 1 % injection 30 mL  30 mL Subcutaneous PRN  Leftwich-Kirby, Wilmer Floor, CNM      . oxytocin (PITOCIN) IV BOLUS FROM BAG  500 mL Intravenous Once Leftwich-Kirby, Wilmer Floor, CNM      . polyethylene glycol (MIRALAX / GLYCOLAX) packet 17 g  17 g Oral Daily [place] Bing, MD      . prenatal multivitamin tablet 1 tablet  1 tablet Oral Q1200 [place] Bing, MD      . sodium citrate-citric acid (ORACIT) solution 30 mL  30 mL Oral Q2H PRN Leftwich-Kirby, Lisa A, CNM   30 mL at 11/16/16 1208  . zolpidem (AMBIEN) tablet 5 mg  5 mg Oral QHS PRN [place] Bing, MD   5 mg at 11/16/16 2143   Facility-Administered Medications Ordered in Other Encounters  Medication Dose Route Frequency Provider Last Rate Last Dose  . lidocaine (PF) (XYLOCAINE) 1 % injection    Anesthesia Intra-op Tonya Able, MD   5 mL at 11/15/16 0548    Labs:   Recent Labs Lab 11/15/16 0037  WBC 15.7*  HGB 10.6*  HCT 30.8*  PLT 323     Recent Labs Lab 11/16/16 1857  NA 137  K 3.5  CL 105  CO2 23  BUN <5*  CREATININE 0.44  CALCIUM 8.3*  PROT 6.7  BILITOT 0.5  ALKPHOS 102  ALT 9*  AST 13*  GLUCOSE 121*   Radiology: no new imaging  Assessment  & Plan:  Pt stable *Pregnancy: routine care. Will check AM fasting and 2hr PP given rescue BMZ course and pt hasn't done a glucola due to insufficient PNC. qday NSTs *PTL: stable exam. I d/w pt and she is okay with pulling her epidural. Cephalic on exam 1/61 -1900: 4/50/high *h/o substance abuse: continue subutex *Recurrent UTIs: f/u Ucx. s/p PCN during PTL initial work up. On keflex ppx *?gHTN: elevated BPs during PTL on L&D. Will continue to follow. 24h ordered. Normal cbc and cmp *Preterm: s/p rescue BMZ on 5/9. *PPx: SCDs, OOB ad lib *FEN/GI: regular diet *Dispo:  Possibly tomorrow if stable exam  Cornelia Copa MD Attending [facility])

## 2016-11-18 LAB — CREATININE CLEARANCE, URINE, 24 HOUR
CREAT CLEAR: 231 mL/min — AB (ref 75–115)
CREATININE 24H UR: 1466 mg/d (ref 600–1800)
CREATININE, URINE: 58.63 mg/dL
Collection Interval-CRCL: 24 hours
Urine Total Volume-CRCL: 2500 mL

## 2016-11-18 LAB — PROTEIN / CREATININE RATIO, URINE
Creatinine, Urine: 63 mg/dL
Protein Creatinine Ratio: 0.13 mg/mg{Cre} (ref 0.00–0.15)
Total Protein, Urine: 8 mg/dL

## 2016-11-18 LAB — PROTEIN, URINE, 24 HOUR
COLLECTION INTERVAL-UPROT: 24 h
Protein, 24H Urine: 5000 mg/d — ABNORMAL HIGH (ref 50–100)
Protein, Urine: 200 mg/dL
URINE TOTAL VOLUME-UPROT: 2500 mL

## 2016-11-18 LAB — GLUCOSE, CAPILLARY: GLUCOSE-CAPILLARY: 96 mg/dL (ref 65–99)

## 2016-11-18 MED ORDER — CEPHALEXIN 500 MG PO CAPS: 500.0000 mg | ORAL_CAPSULE | Freq: Four times a day (QID) | ORAL | 0 refills | Status: DC

## 2016-11-18 NOTE — Discharge Summary (Signed)
Antenatal Physician Discharge Summary  Patient ID: Tonya David MRN: [id number] DOB/AGE: 1986-06-26 31 y.o.  Admit date: 11/14/2016 Discharge date: 11/18/2016  Admission Diagnoses:Principal Problem:   Preterm labor Active Problems:   UTI in pregnancy, antepartum, first trimester    Discharge Diagnoses: Same  Prenatal Procedures: BMZ, epidural  Consults: None  Hospital Course:  This is a 31 y.o. [id number] with IUP at [redacted]w[redacted]d admitted for preterm contractions. She was admitted with contractions, noted to have a cervical exam of 4-5.  No leaking of fluid and no bleeding.  She was initially placed on Birthing Suite and received an epidural received betamethasone x 2 doses.  She had been on Procardia. She was observed, fetal heart rate monitoring remained reassuring, and she had no signs/symptoms of progressing preterm labor or other maternal-fetal concerns.  Her cervical exam was unchanged from admission. Her urine culture was + E.coli and she was placed on treatment doses of Keflex. Her epidural was discontinued.  She was deemed stable for discharge to home with outpatient follow up.  Discharge Exam: Temp:  [98.2 F (36.8 C)-99.1 F (37.3 C)] 98.6 F (37 C) (05/12 0530) Pulse Rate:  [80-91] 80 (05/12 0530) Resp:  [18-20] 18 (05/12 0530) BP: (111-128)/(52-75) 121/52 (05/12 0530) SpO2:  [98 %-100 %] 100 % (05/12 0530) Physical Examination: CONSTITUTIONAL: Well-developed, well-nourished female in no acute distress.  HENT:  Normocephalic, atraumatic EYES:  EOM are normal.No scleral icterus.  NECK: Normal range of motion, supple, no masses SKIN: Skin is warm and dry. No rash noted.  NEUROLGIC: Alert and oriented to person, place, and time. PSYCHIATRIC: Normal mood and affect. Normal behavior. Normal judgment and thought content. CARDIOVASCULAR: Normal heart rate noted, regular rhythm RESPIRATORY: Effort and breath sounds normal, no problems with respiration noted MUSCULOSKELETAL:  Normal range of motion. No edema and no tenderness. 2+ distal pulses. ABDOMEN: Soft, nontender, nondistended, gravid. CERVIX: Dilation: 4 Effacement (%): 50 Cervical Position: Middle Station: Ballotable Presentation: Vertex Exam by:: Dr. Shawnie Pons  Fetal monitoring: FHR: 120 bpm, Variability: moderate, Accelerations: Present, Decelerations: Absent  Uterine activity: 2-6 contractions per hour  Significant Diagnostic Studies:  Results for orders placed or performed during the hospital encounter of 11/14/16 (from the past 168 hour(s))  OB RESULTS CONSOLE GC/Chlamydia   Collection Time: 11/14/16 12:00 AM  Result Value Ref Range   Gonorrhea Negative   GC/Chlamydia probe amp ([place])not at [facility]   Collection Time: 11/14/16 12:00 AM  Result Value Ref Range   Chlamydia Negative    Neisseria gonorrhea Negative   Urine culture   Collection Time: 11/14/16 10:56 PM  Result Value Ref Range   Specimen Description URINE, CLEAN CATCH    Special Requests on keflex suppression Normal    Culture >=100,000 COLONIES/mL ESCHERICHIA COLI (A)    Report Status 11/17/2016 FINAL    Organism ID, Bacteria ESCHERICHIA COLI (A)       Susceptibility   Escherichia coli - MIC*    AMPICILLIN 8 SENSITIVE Sensitive     CEFAZOLIN <=4 SENSITIVE Sensitive     CEFTRIAXONE <=1 SENSITIVE Sensitive     CIPROFLOXACIN <=0.25 SENSITIVE Sensitive     GENTAMICIN <=1 SENSITIVE Sensitive     IMIPENEM <=0.25 SENSITIVE Sensitive     NITROFURANTOIN <=16 SENSITIVE Sensitive     TRIMETH/SULFA <=20 SENSITIVE Sensitive     AMPICILLIN/SULBACTAM <=2 SENSITIVE Sensitive     PIP/TAZO <=4 SENSITIVE Sensitive     Extended ESBL NEGATIVE Sensitive     * >=100,000 COLONIES/mL ESCHERICHIA COLI  Urinalysis, Routine w reflex microscopic   Collection Time: 11/14/16 10:56 PM  Result Value Ref Range   Color, Urine YELLOW YELLOW   APPearance HAZY (A) CLEAR   Specific Gravity, Urine 1.001 (L) 1.005 - 1.030   pH 7.0 5.0 - 8.0   Glucose, UA  NEGATIVE NEGATIVE mg/dL   Hgb urine dipstick LARGE (A) NEGATIVE   Bilirubin Urine NEGATIVE NEGATIVE   Ketones, ur NEGATIVE NEGATIVE mg/dL   Protein, ur NEGATIVE NEGATIVE mg/dL   Nitrite NEGATIVE NEGATIVE   Leukocytes, UA LARGE (A) NEGATIVE   RBC / HPF 0-5 0 - 5 RBC/hpf   WBC, UA 6-30 0 - 5 WBC/hpf   Bacteria, UA RARE (A) NONE SEEN   Squamous Epithelial / LPF 0-5 (A) NONE SEEN  Urine rapid drug screen (hosp performed)   Collection Time: 11/14/16 10:56 PM  Result Value Ref Range   Opiates NONE DETECTED NONE DETECTED   Cocaine NONE DETECTED NONE DETECTED   Benzodiazepines NONE DETECTED NONE DETECTED   Amphetamines NONE DETECTED NONE DETECTED   Tetrahydrocannabinol NONE DETECTED NONE DETECTED   Barbiturates NONE DETECTED NONE DETECTED  Wet prep, genital   Collection Time: 11/14/16 11:20 PM  Result Value Ref Range   Yeast Wet Prep HPF POC NONE SEEN NONE SEEN   Trich, Wet Prep NONE SEEN NONE SEEN   Clue Cells Wet Prep HPF POC PRESENT (A) NONE SEEN   WBC, Wet Prep HPF POC MANY (A) NONE SEEN   Sperm NONE SEEN   Culture, beta strep (group b only)   Collection Time: 11/14/16 11:20 PM  Result Value Ref Range   Specimen Description VAGINAL/RECTAL    Special Requests Normal    Culture      NO GROUP B STREP (S.AGALACTIAE) ISOLATED Performed at [facility], [address] [place], [place] [address]    Report Status 11/17/2016 FINAL   RPR   Collection Time: 11/14/16 11:28 PM  Result Value Ref Range   RPR Ser Ql Non Reactive Non Reactive  Type and screen [facility]   Collection Time: 11/14/16 11:28 PM  Result Value Ref Range   ABO/RH(D) O POS    Antibody Screen NEG    Sample Expiration 11/17/2016   CBC   Collection Time: 11/15/16 12:37 AM  Result Value Ref Range   WBC 15.7 (H) 4.0 - 10.5 K/uL   RBC 3.28 (L) 3.87 - 5.11 MIL/uL   Hemoglobin 10.6 (L) 12.0 - 15.0 g/dL   HCT 30.830.8 (L) 65.736.0 - 84.646.0 %   MCV 93.9 78.0 - 100.0 fL   MCH 32.3 26.0 - 34.0 pg    MCHC 34.4 30.0 - 36.0 g/dL   RDW 96.213.1 95.211.5 - 84.115.5 %   Platelets 323 150 - 400 K/uL  Glucose, capillary   Collection Time: 11/15/16 12:47 AM  Result Value Ref Range   Glucose-Capillary 107 (H) 65 - 99 mg/dL  Comprehensive metabolic panel   Collection Time: 11/16/16  6:57 PM  Result Value Ref Range   Sodium 137 135 - 145 mmol/L   Potassium 3.5 3.5 - 5.1 mmol/L   Chloride 105 101 - 111 mmol/L   CO2 23 22 - 32 mmol/L   Glucose, Bld 121 (H) 65 - 99 mg/dL   BUN <5 (L) 6 - 20 mg/dL   Creatinine, Ser 3.240.44 0.44 - 1.00 mg/dL   Calcium 8.3 (L) 8.9 - 10.3 mg/dL   Total Protein 6.7 6.5 - 8.1 g/dL   Albumin 3.3 (L) 3.5 -  5.0 g/dL   AST 13 (L) 15 - 41 U/L   ALT 9 (L) 14 - 54 U/L   Alkaline Phosphatase 102 38 - 126 U/L   Total Bilirubin 0.5 0.3 - 1.2 mg/dL   GFR calc non Af Amer >60 >60 mL/min   GFR calc Af Amer >60 >60 mL/min   Anion gap 9 5 - 15  Glucose, capillary   Collection Time: 11/17/16  8:21 AM  Result Value Ref Range   Glucose-Capillary 101 (H) 65 - 99 mg/dL   Comment 1 Notify RN    Comment 2 Document in Chart   Glucose, capillary   Collection Time: 11/17/16 10:50 AM  Result Value Ref Range   Glucose-Capillary 121 (H) 65 - 99 mg/dL  Glucose, capillary   Collection Time: 11/17/16  4:43 PM  Result Value Ref Range   Glucose-Capillary 107 (H) 65 - 99 mg/dL   Comment 1 Notify RN    Comment 2 Document in Chart   Glucose, capillary   Collection Time: 11/17/16 11:37 PM  Result Value Ref Range   Glucose-Capillary 109 (H) 65 - 99 mg/dL  Glucose, capillary   Collection Time: 11/18/16  8:12 AM  Result Value Ref Range   Glucose-Capillary 96 65 - 99 mg/dL   Comment 1 Notify RN    Comment 2 Document in Chart     Discharge Condition: Stable  Disposition: 01-Home or Self Care   Discharge Instructions    Discharge activity:  Bathroom / Shower only    Complete by:  As directed    Discharge activity: Bedrest    Complete by:  As directed    Discharge diet:  No restrictions     Complete by:  As directed    Do not have sex or do anything that might make you have an orgasm    Complete by:  As directed    Fetal Kick Count:  Lie on our left side for one hour after a meal, and count the number of times your baby kicks.  If it is less than 5 times, get up, move around and drink some juice.  Repeat the test 30 minutes later.  If it is still less than 5 kicks in an hour, notify your doctor.    Complete by:  As directed    Notify physician for a general feeling that "something is not right"    Complete by:  As directed    Notify physician for increase or change in vaginal discharge    Complete by:  As directed    Notify physician for intestinal cramps, with or without diarrhea, sometimes described as "gas pain"    Complete by:  As directed    Notify physician for leaking of fluid    Complete by:  As directed    Notify physician for low, dull backache, unrelieved by heat or Tylenol    Complete by:  As directed    Notify physician for menstrual like cramps    Complete by:  As directed    Notify physician for pelvic pressure    Complete by:  As directed    Notify physician for uterine contractions.  These may be painless and feel like the uterus is tightening or the baby is  "balling up"    Complete by:  As directed    Notify physician for vaginal bleeding    Complete by:  As directed    PRETERM LABOR:  Includes any of the follwing symptoms that occur between  20 - [redacted] weeks gestation.  If these symptoms are not stopped, preterm labor can result in preterm delivery, placing your baby at risk    Complete by:  As directed      Allergies as of 11/18/2016   No Known Allergies     Medication List    TAKE these medications   acetaminophen 500 MG tablet Commonly known as:  TYLENOL Take 1,000 mg by mouth 2 (two) times daily as needed for moderate pain.   buprenorphine 8 MG Subl SL tablet Commonly known as:  SUBUTEX Place 8 mg under the tongue 3 (three) times daily.    cephALEXin 500 MG capsule Commonly known as:  KEFLEX Take 500 mg by mouth at bedtime. What changed:  Another medication with the same name was added. Make sure you understand how and when to take each.   cephALEXin 500 MG capsule Commonly known as:  KEFLEX Take 1 capsule (500 mg total) by mouth 4 (four) times daily. What changed:  You were already taking a medication with the same name, and this prescription was added. Make sure you understand how and when to take each.   loratadine 10 MG tablet Commonly known as:  CLARITIN Take 10 mg by mouth daily as needed for allergies.   multivitamin-prenatal 27-0.8 MG Tabs tablet Take 1 tablet by mouth daily.   NIFEdipine 30 MG 24 hr tablet Commonly known as:  PROCARDIA XL Take 1 tablet (30 mg total) by mouth 2 (two) times daily.      Follow-up Information    [facility] Follow up in 1 week(s).   Specialty:  Obstetrics and Gynecology Why:  they will call you with an appointment Contact information: [address], [address] [place] [address] [phone]          Signed: Reva Bores M.D. 11/18/2016, 8:41 AM

## 2016-11-22 ENCOUNTER — Inpatient Hospital Stay (HOSPITAL_COMMUNITY)
Admission: AD | Admit: 2016-11-22 | Discharge: 2016-11-25 | DRG: 775 | Disposition: A | Discharge status: Home / Self Care | Payer: Medicaid Other | Source: Ambulatory Visit | Attending: Obstetrics and Gynecology | Admitting: Obstetrics and Gynecology

## 2016-11-22 DIAGNOSIS — Z3A35 35 weeks gestation of pregnancy: Secondary | ICD-10-CM

## 2016-11-22 DIAGNOSIS — Z87891 Personal history of nicotine dependence: Secondary | ICD-10-CM

## 2016-11-22 DIAGNOSIS — O26873 Cervical shortening, third trimester: Principal | ICD-10-CM | POA: Diagnosis present

## 2016-11-22 NOTE — MAU Note (Signed)
Pt reports contractions on and off since last night, now every 5-7 mins. States she was recently admitted last week for PTL. Was 4cm at that time. Pt denies LOF or vaginal bleeding. +FM

## 2016-11-23 ENCOUNTER — Inpatient Hospital Stay (HOSPITAL_COMMUNITY): Payer: Medicaid Other | Admitting: Anesthesiology

## 2016-11-23 ENCOUNTER — Encounter (HOSPITAL_COMMUNITY)
Admission: AD | Disposition: A | Discharge status: Home / Self Care | Payer: Self-pay | Source: Ambulatory Visit | Attending: Obstetrics and Gynecology

## 2016-11-23 ENCOUNTER — Encounter: Payer: Medicaid Other | Admitting: Obstetrics and Gynecology

## 2016-11-23 ENCOUNTER — Encounter (HOSPITAL_COMMUNITY): Payer: Self-pay | Admitting: *Deleted

## 2016-11-23 DIAGNOSIS — O26873 Cervical shortening, third trimester: Secondary | ICD-10-CM | POA: Diagnosis present

## 2016-11-23 DIAGNOSIS — Z87891 Personal history of nicotine dependence: Secondary | ICD-10-CM | POA: Diagnosis not present

## 2016-11-23 DIAGNOSIS — Z3A35 35 weeks gestation of pregnancy: Secondary | ICD-10-CM

## 2016-11-23 LAB — TYPE AND SCREEN
ABO/RH(D): O POS
ANTIBODY SCREEN: NEGATIVE

## 2016-11-23 LAB — URINALYSIS, ROUTINE W REFLEX MICROSCOPIC
BILIRUBIN URINE: NEGATIVE
GLUCOSE, UA: NEGATIVE mg/dL
HGB URINE DIPSTICK: NEGATIVE
Ketones, ur: NEGATIVE mg/dL
NITRITE: NEGATIVE
PH: 6 (ref 5.0–8.0)
Protein, ur: NEGATIVE mg/dL
SPECIFIC GRAVITY, URINE: 1.008 (ref 1.005–1.030)

## 2016-11-23 LAB — CBC
HEMATOCRIT: 30.9 % — AB (ref 36.0–46.0)
HEMOGLOBIN: 10.8 g/dL — AB (ref 12.0–15.0)
MCH: 33.3 pg (ref 26.0–34.0)
MCHC: 35 g/dL (ref 30.0–36.0)
MCV: 95.4 fL (ref 78.0–100.0)
Platelets: 401 10*3/uL — ABNORMAL HIGH (ref 150–400)
RBC: 3.24 MIL/uL — AB (ref 3.87–5.11)
RDW: 13.7 % (ref 11.5–15.5)
WBC: 19.5 10*3/uL — AB (ref 4.0–10.5)

## 2016-11-23 LAB — RPR: RPR: NONREACTIVE

## 2016-11-23 SURGERY — LIGATION, FALLOPIAN TUBE, POSTPARTUM
Anesthesia: Choice

## 2016-11-23 MED ORDER — SIMETHICONE 80 MG PO CHEW: 80.0000 mg | CHEWABLE_TABLET | ORAL | Status: DC | PRN

## 2016-11-23 MED ORDER — LACTATED RINGERS IV SOLN
INTRAVENOUS | Status: DC
Start: 2016-11-23 — End: 2016-11-23
  Administered 2016-11-23 (×2): via INTRAVENOUS

## 2016-11-23 MED ORDER — FENTANYL 2.5 MCG/ML BUPIVACAINE 1/10 % EPIDURAL INFUSION (WH - ANES)
INTRAMUSCULAR | Status: AC
  Filled 2016-11-23: qty 100

## 2016-11-23 MED ORDER — WITCH HAZEL-GLYCERIN EX PADS: 1.0000 "application " | MEDICATED_PAD | CUTANEOUS | Status: DC | PRN

## 2016-11-23 MED ORDER — LIDOCAINE HCL (PF) 1 % IJ SOLN
30.0000 mL | INTRAMUSCULAR | Status: DC | PRN
  Filled 2016-11-23: qty 30

## 2016-11-23 MED ORDER — ACETAMINOPHEN 325 MG PO TABS: 650.0000 mg | ORAL_TABLET | ORAL | Status: DC | PRN

## 2016-11-23 MED ORDER — EPHEDRINE 5 MG/ML INJ
10.0000 mg | INTRAVENOUS | Status: DC | PRN
  Filled 2016-11-23: qty 2

## 2016-11-23 MED ORDER — BUPRENORPHINE HCL 8 MG SL SUBL
8.0000 mg | SUBLINGUAL_TABLET | Freq: Three times a day (TID) | SUBLINGUAL | Status: DC
  Filled 2016-11-23 (×3): qty 1

## 2016-11-23 MED ORDER — PHENYLEPHRINE 40 MCG/ML (10ML) SYRINGE FOR IV PUSH (FOR BLOOD PRESSURE SUPPORT)
80.0000 ug | PREFILLED_SYRINGE | INTRAVENOUS | Status: DC | PRN
  Filled 2016-11-23: qty 5

## 2016-11-23 MED ORDER — NALBUPHINE HCL 10 MG/ML IJ SOLN: 5.0000 mg | INTRAMUSCULAR | Status: DC | PRN

## 2016-11-23 MED ORDER — TETANUS-DIPHTH-ACELL PERTUSSIS 5-2.5-18.5 LF-MCG/0.5 IM SUSP: 0.5000 mL | Freq: Once | INTRAMUSCULAR | Status: DC

## 2016-11-23 MED ORDER — LIDOCAINE HCL (PF) 1 % IJ SOLN
INTRAMUSCULAR | Status: DC | PRN
  Administered 2016-11-23 (×2): 4 mL via EPIDURAL

## 2016-11-23 MED ORDER — PRENATAL MULTIVITAMIN CH
1.0000 | ORAL_TABLET | Freq: Every day | ORAL | Status: DC
  Administered 2016-11-24: 1 via ORAL
  Filled 2016-11-23 (×3): qty 1

## 2016-11-23 MED ORDER — OXYCODONE-ACETAMINOPHEN 5-325 MG PO TABS: 1.0000 | ORAL_TABLET | ORAL | Status: DC | PRN

## 2016-11-23 MED ORDER — DIPHENHYDRAMINE HCL 25 MG PO CAPS: 25.0000 mg | ORAL_CAPSULE | Freq: Four times a day (QID) | ORAL | Status: DC | PRN

## 2016-11-23 MED ORDER — ONDANSETRON HCL 4 MG/2ML IJ SOLN: 4.0000 mg | Freq: Four times a day (QID) | INTRAMUSCULAR | Status: DC | PRN

## 2016-11-23 MED ORDER — SOD CITRATE-CITRIC ACID 500-334 MG/5ML PO SOLN
30.0000 mL | ORAL | Status: DC | PRN
  Filled 2016-11-23 (×2): qty 15

## 2016-11-23 MED ORDER — OXYCODONE HCL 5 MG PO TABS
10.0000 mg | ORAL_TABLET | ORAL | Status: DC | PRN
  Administered 2016-11-23 – 2016-11-25 (×8): 10 mg via ORAL
  Filled 2016-11-23 (×8): qty 2

## 2016-11-23 MED ORDER — BUPRENORPHINE HCL-NALOXONE HCL 8-2 MG SL SUBL: 1.0000 | SUBLINGUAL_TABLET | Freq: Every day | SUBLINGUAL | Status: DC

## 2016-11-23 MED ORDER — DIPHENHYDRAMINE HCL 50 MG/ML IJ SOLN: 12.5000 mg | INTRAMUSCULAR | Status: DC | PRN

## 2016-11-23 MED ORDER — CEPHALEXIN 500 MG PO CAPS
500.0000 mg | ORAL_CAPSULE | Freq: Four times a day (QID) | ORAL | Status: DC
  Administered 2016-11-23 – 2016-11-25 (×8): 500 mg via ORAL
  Filled 2016-11-23 (×14): qty 1

## 2016-11-23 MED ORDER — BENZOCAINE-MENTHOL 20-0.5 % EX AERO: 1.0000 "application " | INHALATION_SPRAY | CUTANEOUS | Status: DC | PRN

## 2016-11-23 MED ORDER — LACTATED RINGERS IV SOLN: 500.0000 mL | INTRAVENOUS | Status: DC | PRN

## 2016-11-23 MED ORDER — FENTANYL 2.5 MCG/ML BUPIVACAINE 1/10 % EPIDURAL INFUSION (WH - ANES)
14.0000 mL/h | INTRAMUSCULAR | Status: DC | PRN
  Administered 2016-11-23: 14 mL/h via EPIDURAL

## 2016-11-23 MED ORDER — ACETAMINOPHEN 325 MG PO TABS
650.0000 mg | ORAL_TABLET | ORAL | Status: DC | PRN
  Administered 2016-11-23 – 2016-11-25 (×8): 650 mg via ORAL
  Filled 2016-11-23 (×8): qty 2

## 2016-11-23 MED ORDER — OXYCODONE-ACETAMINOPHEN 5-325 MG PO TABS
2.0000 | ORAL_TABLET | ORAL | Status: DC | PRN
  Administered 2016-11-23: 2 via ORAL
  Filled 2016-11-23: qty 2

## 2016-11-23 MED ORDER — OXYCODONE HCL 5 MG PO TABS
5.0000 mg | ORAL_TABLET | ORAL | Status: DC | PRN
  Administered 2016-11-23 (×2): 5 mg via ORAL
  Filled 2016-11-23 (×2): qty 1

## 2016-11-23 MED ORDER — OXYCODONE HCL 5 MG PO TABS: 5.0000 mg | ORAL_TABLET | ORAL | Status: DC | PRN

## 2016-11-23 MED ORDER — IBUPROFEN 600 MG PO TABS
600.0000 mg | ORAL_TABLET | Freq: Four times a day (QID) | ORAL | Status: DC
  Administered 2016-11-23 – 2016-11-25 (×9): 600 mg via ORAL
  Filled 2016-11-23 (×8): qty 1

## 2016-11-23 MED ORDER — BETAMETHASONE SOD PHOS & ACET 6 (3-3) MG/ML IJ SUSP: 12.0000 mg | Freq: Once | INTRAMUSCULAR | Status: DC

## 2016-11-23 MED ORDER — LACTATED RINGERS IV SOLN
500.0000 mL | Freq: Once | INTRAVENOUS | Status: AC
  Administered 2016-11-23: 500 mL via INTRAVENOUS

## 2016-11-23 MED ORDER — PHENYLEPHRINE 40 MCG/ML (10ML) SYRINGE FOR IV PUSH (FOR BLOOD PRESSURE SUPPORT)
PREFILLED_SYRINGE | INTRAVENOUS | Status: AC
  Filled 2016-11-23: qty 10

## 2016-11-23 MED ORDER — COCONUT OIL OIL: 1.0000 "application " | TOPICAL_OIL | Status: DC | PRN

## 2016-11-23 MED ORDER — ONDANSETRON HCL 4 MG PO TABS: 4.0000 mg | ORAL_TABLET | ORAL | Status: DC | PRN

## 2016-11-23 MED ORDER — ZOLPIDEM TARTRATE 5 MG PO TABS
5.0000 mg | ORAL_TABLET | Freq: Every evening | ORAL | Status: DC | PRN
Start: 2016-11-23 — End: 2016-11-25

## 2016-11-23 MED ORDER — DIBUCAINE 1 % RE OINT: 1.0000 "application " | TOPICAL_OINTMENT | RECTAL | Status: DC | PRN

## 2016-11-23 MED ORDER — SENNOSIDES-DOCUSATE SODIUM 8.6-50 MG PO TABS
2.0000 | ORAL_TABLET | ORAL | Status: DC
  Administered 2016-11-23 – 2016-11-25 (×2): 2 via ORAL
  Filled 2016-11-23 (×2): qty 2

## 2016-11-23 MED ORDER — OXYTOCIN BOLUS FROM INFUSION
500.0000 mL | Freq: Once | INTRAVENOUS | Status: AC
  Administered 2016-11-23: 500 mL/h via INTRAVENOUS

## 2016-11-23 MED ORDER — ONDANSETRON HCL 4 MG/2ML IJ SOLN: 4.0000 mg | INTRAMUSCULAR | Status: DC | PRN

## 2016-11-23 MED ORDER — OXYTOCIN 40 UNITS IN LACTATED RINGERS INFUSION - SIMPLE MED
2.5000 [IU]/h | INTRAVENOUS | Status: DC
  Filled 2016-11-23: qty 1000

## 2016-11-23 NOTE — Progress Notes (Signed)
Labor Progress Note  Tonya CollardJammie L David is a 31 y.o. [id number] at 619w0d  admitted for preterm labor  S: Now complete, but not feeling urge to push. Membranes still intact. Epidural providing good pain control.    O:  BP 136/80   Pulse 90   Temp 98.6 F (37 C) (Oral)   Resp 18   Ht 5\' 7"  (1.702 m)   Wt 165 lb (74.8 kg)   LMP 01/22/2016   SpO2 100%   BMI 25.84 kg/m   No intake/output data recorded.  FHT:  FHR: 135 bpm, variability: moderate,  accelerations:  Present,  decelerations:  Absent UC:   regular SVE:   Dilation: 10 Effacement (%): 100 Station: -1, -2 Exam by:: Minus Libertyhristy Leshowitz, RN SROM/AROM: Bulging bag  Labs: Lab Results  Component Value Date   WBC 19.5 (H) 11/23/2016   HGB 10.8 (L) 11/23/2016   HCT 30.9 (L) 11/23/2016   MCV 95.4 11/23/2016   PLT 401 (H) 11/23/2016    Assessment / Plan: 31 y.o. [id number] [redacted]w[redacted]d presenting for preterm labor.  Spontaneous labor, progressing normally  Labor: Progressing normally Fetal Wellbeing:  Category I Pain Control:  Epidural Anticipated MOD:  NSVD  Expectant management   Tarri AbernethyAbigail J Patterson Hollenbaugh, MD, MPH PGY-2 [facility]

## 2016-11-23 NOTE — Anesthesia Postprocedure Evaluation (Signed)
Anesthesia Post Note  Patient: Tonya David  Procedure(s) Performed: * No procedures listed *  Patient location during evaluation: Mother Baby Anesthesia Type: Epidural Level of consciousness: awake Pain management: pain level controlled Vital Signs Assessment: post-procedure vital signs reviewed and stable Respiratory status: spontaneous breathing Cardiovascular status: stable Postop Assessment: no headache, no backache, epidural receding, patient able to bend at knees, no signs of nausea or vomiting and adequate PO intake Anesthetic complications: no        Last Vitals:  Vitals:   11/23/16 1130 11/23/16 1230  BP: 137/73 124/82  Pulse: 76 83  Resp: 18 18  Temp: 36.7 C 36.8 C    Last Pain:  Vitals:   11/23/16 1230  TempSrc: Oral  PainSc: 0-No pain   Pain Goal: Patients Stated Pain Goal: 0 (11/23/16 0006)               Jessicia Napolitano

## 2016-11-23 NOTE — Progress Notes (Signed)
Delivery of live viable female by Pincus BadderK, Shaw, CNM assisted by Dr Mosetta PuttFeng NICU at bedside, APGARs 8,9

## 2016-11-23 NOTE — Anesthesia Procedure Notes (Signed)
Epidural Patient location during procedure: OB Start time: 11/23/2016 1:51 AM  Staffing Anesthesiologist: Mal AmabileFOSTER, Amonda Brillhart Performed: anesthesiologist   Preanesthetic Checklist Completed: patient identified, site marked, surgical consent, pre-op evaluation, timeout performed, IV checked, risks and benefits discussed and monitors and equipment checked  Epidural Patient position: sitting Prep: site prepped and draped and DuraPrep Patient monitoring: continuous pulse ox and blood pressure Approach: midline Location: L3-L4 Injection technique: LOR air  Needle:  Needle type: Tuohy  Needle gauge: 17 G Needle length: 9 cm and 9 Needle insertion depth: 5 cm cm Catheter type: closed end flexible Catheter size: 19 Gauge Catheter at skin depth: 10 cm Test dose: negative and Other  Assessment Events: blood not aspirated, injection not painful, no injection resistance, negative IV test and no paresthesia  Additional Notes Patient identified. Risks and benefits discussed including failed block, incomplete  Pain control, post dural puncture headache, nerve damage, paralysis, blood pressure Changes, nausea, vomiting, reactions to medications-both toxic and allergic and post Partum back pain. All questions were answered. Patient expressed understanding and wished to proceed. Sterile technique was used throughout procedure. Epidural site was Dressed with sterile barrier dressing. No paresthesias, signs of intravascular injection Or signs of intrathecal spread were encountered.  Patient was more comfortable after the epidural was dosed. Please see RN's note for documentation of vital signs and FHR which are stable.

## 2016-11-23 NOTE — MAU Provider Note (Signed)
S: Tonya CollardJammie L David is a 31 y.o. [id number] at 7773w0d who presents today with contractions. She denies any VB or LOF. She confirms fetal movement. O: VSS, afebrile Abdomen: soft, non-tender, gravid Cervix: 5/90/BBOW  Uterus: AGA FHT: 135, moderate with 15x15 accels, no decels  Toco: about every 2-3 mins  Results for orders placed or performed during the hospital encounter of 11/22/16 (from the past 24 hour(s))  Urinalysis, Routine w reflex microscopic     Status: Abnormal   Collection Time: 11/22/16 11:47 PM  Result Value Ref Range   Color, Urine YELLOW YELLOW   APPearance CLEAR CLEAR   Specific Gravity, Urine 1.008 1.005 - 1.030   pH 6.0 5.0 - 8.0   Glucose, UA NEGATIVE NEGATIVE mg/dL   Hgb urine dipstick NEGATIVE NEGATIVE   Bilirubin Urine NEGATIVE NEGATIVE   Ketones, ur NEGATIVE NEGATIVE mg/dL   Protein, ur NEGATIVE NEGATIVE mg/dL   Nitrite NEGATIVE NEGATIVE   Leukocytes, UA MODERATE (A) NEGATIVE   RBC / HPF 0-5 0 - 5 RBC/hpf   WBC, UA 0-5 0 - 5 WBC/hpf   Bacteria, UA RARE (A) NONE SEEN   Squamous Epithelial / LPF 0-5 (A) NONE SEEN   Mucous PRESENT    A/P: Preterm labor  RN will report to labor team

## 2016-11-23 NOTE — Anesthesia Preprocedure Evaluation (Signed)
Anesthesia Evaluation  Patient identified by MRN, date of birth, ID band Patient awake    Reviewed: Allergy & Precautions, H&P , NPO status , Patient's Chart, lab work & pertinent test results  Airway Mallampati: I  TM Distance: >3 FB Neck ROM: full    Dental no notable dental hx.    Pulmonary Current Smoker,    Pulmonary exam normal breath sounds clear to auscultation       Cardiovascular negative cardio ROS Normal cardiovascular exam Rhythm:regular Rate:Normal     Neuro/Psych  Headaches, negative psych ROS   GI/Hepatic GERD  ,(+)     substance abuse  , On Subutex maintenance   Endo/Other  negative endocrine ROS  Renal/GU negative Renal ROS  negative genitourinary   Musculoskeletal negative musculoskeletal ROS (+)   Abdominal Normal abdominal exam  (+)   Peds  Hematology  (+) anemia ,   Anesthesia Other Findings   Reproductive/Obstetrics (+) Pregnancy PTL                             Anesthesia Physical  Anesthesia Plan  ASA: II  Anesthesia Plan: Epidural   Post-op Pain Management:    Induction:   Airway Management Planned: Natural Airway  Additional Equipment:   Intra-op Plan:   Post-operative Plan:   Informed Consent: I have reviewed the patients History and Physical, chart, labs and discussed the procedure including the risks, benefits and alternatives for the proposed anesthesia with the patient or authorized representative who has indicated his/her understanding and acceptance.     Plan Discussed with: Anesthesiologist  Anesthesia Plan Comments:         Anesthesia Quick Evaluation

## 2016-11-23 NOTE — Progress Notes (Signed)
Labor Progress Note  Tonya CollardJammie L David is a 31 y.o. [id number] at 7082w0d  admitted for Preterm labor  S: Patient reporting adequate pain control with epidural. Says she can feel pressure during some contractions but denies pain.    O:  BP 128/70   Pulse 82   Temp 98.7 F (37.1 C) (Oral)   Resp 18   Ht 5\' 7"  (1.702 m)   Wt 165 lb (74.8 kg)   LMP 01/22/2016   SpO2 100%   BMI 25.84 kg/m   No intake/output data recorded.  FHT:  FHR: 130 bpm, variability: minimal ,  accelerations:  Present,  decelerations:  Absent UC:   regular SVE:   Dilation: 5 Effacement (%): 90 Station: -2 Exam by:: Tonya Libertyhristy Leshowitz, RN SROM/AROM: Bulging bag  Labs: Lab Results  Component Value Date   WBC 19.5 (H) 11/23/2016   HGB 10.8 (L) 11/23/2016   HCT 30.9 (L) 11/23/2016   MCV 95.4 11/23/2016   PLT 401 (H) 11/23/2016    Assessment / Plan: 31 y.o. [id number] 7582w0d presenting for preterm labor.  Spontaneous labor, progressing normally  Labor: Progressing normally Fetal Wellbeing:  Category I Pain Control:  Epidural Anticipated MOD:  NSVD  Expectant management   Tarri AbernethyAbigail J Lancaster, MD, MPH PGY-2 [facility]

## 2016-11-23 NOTE — H&P (Signed)
Tonya David is a 31 y.o. [id number] female at [redacted]w[redacted]d by 5wk ultrasound presenting with contractions. Patient reports she began to experience contractions last night, and they continued to increase in strength and frequency throughout the day today. She denies any vaginal bleeding or gush of fluids. Endorses positive fetal movement.     Patient was recently admitted for preterm contractions from 05/08-05/12. She was given BMZ x2 during that admission. Cervical exam at time of admit was 4-5. FHR remained reassuring, there were no signs of labor progression, and cervical exam did not change, so patient was felt stable for discharge home with outpatient f/u.    Reports active fetal movement, contractions: regular, every 5-7 minutes, vaginal bleeding: spotting, membranes: intact.  Initiated prenatal care at [facility] for [employer] at 18 wks 6 days.   Most recent u/s 11/14/16. EFW: 2258 gm, 59%. AFI: 17. Presentation: vertex  This pregnancy complicated by: Short cervix, taking prometrium qHS Recurrent UTI, nightly suppression with Keflex Subutex in pregnancy 8mg  TID Limited prenatal care  Prenatal History/Complications:  History of 4 preterm deliveries  Past Medical History: Past Medical History:  Diagnosis Date  . Anemia   . Headache(784.0)   . Preterm labor   . Urinary tract infection     Past Surgical History: Past Surgical History:  Procedure Laterality Date  . THERAPEUTIC ABORTION      Obstetrical History: OB History    Gravida Para Term Preterm AB Living   10 6 2 4 3 6    SAB TAB Ectopic Multiple Live Births   1 2 0 0 6      Social History: Social History   Social History  . Marital status: Single    Spouse name: N/A  . Number of children: N/A  . Years of education: N/A   Social History Main Topics  . Smoking status: Former Smoker    Packs/day: 1.00    Types: Cigarettes  . Smokeless tobacco: Current User  . Alcohol use Yes     Comment:  occasionally  . Drug use: No  . Sexual activity: Yes    Birth control/ protection: None   Other Topics Concern  . None   Social History Narrative  . None    Family History: Family History  Problem Relation Age of Onset  . Cancer Maternal Grandmother   . Cancer Maternal Grandfather   . Alcohol abuse Neg Hx   . Arthritis Neg Hx   . Asthma Neg Hx   . Birth defects Neg Hx   . COPD Neg Hx   . Depression Neg Hx   . Diabetes Neg Hx   . Drug abuse Neg Hx   . Early death Neg Hx   . Hearing loss Neg Hx   . Heart disease Neg Hx   . Hypertension Neg Hx   . Hyperlipidemia Neg Hx   . Kidney disease Neg Hx   . Learning disabilities Neg Hx   . Mental illness Neg Hx   . Mental retardation Neg Hx   . Miscarriages / Stillbirths Neg Hx   . Stroke Neg Hx   . Vision loss Neg Hx   . Other Neg Hx     Allergies: No Known Allergies  Prescriptions Prior to Admission  Medication Sig Dispense Refill Last Dose  . acetaminophen (TYLENOL) 500 MG tablet Take 1,000 mg by mouth 2 (two) times daily as needed for moderate pain.   Past Week at Unknown time  . buprenorphine (SUBUTEX) 8 MG  SUBL SL tablet Place 8 mg under the tongue 3 (three) times daily.   0 11/23/2016 at Unknown time  . cephALEXin (KEFLEX) 500 MG capsule Take 1 capsule (500 mg total) by mouth 4 (four) times daily. 28 capsule 0 11/22/2016 at Unknown time  . loratadine (CLARITIN) 10 MG tablet Take 10 mg by mouth daily as needed for allergies.   Past Month at Unknown time  . Prenatal Vit-Fe Fumarate-FA (MULTIVITAMIN-PRENATAL) 27-0.8 MG TABS tablet Take 1 tablet by mouth daily. 30 each 9 11/23/2016 at Unknown time  . cephALEXin (KEFLEX) 500 MG capsule Take 500 mg by mouth at bedtime.   Past Month at Unknown time  . NIFEdipine (PROCARDIA XL) 30 MG 24 hr tablet Take 1 tablet (30 mg total) by mouth 2 (two) times daily. 60 tablet 3 11/13/2016 at Unknown time    Review of Systems  Pertinent pos/neg as indicated in HPI  Blood pressure 140/69,  pulse 93, temperature 98.7 F (37.1 C), temperature source Oral, resp. rate 18, height 5\' 7"  (1.702 m), weight 165 lb (74.8 kg), last menstrual period 01/22/2016, SpO2 100 %. General appearance: alert, cooperative and no distress Lungs: CTAB, normal WOB on RA, no wheezing Heart: RRR, no murmurs appreciated Abdomen: gravid, soft, non-tender Extremities: no LE edema, no calf-tenderness or erythema   Fetal monitoring: FHR: 130 bpm, Variability: minimal,  Accelerations: Present,  Decelerations:  Absent  Dilation: 5 Effacement (%): 90 Station: -2 Exam by:: Tonya David Presentation: cephalic   Prenatal labs: ABO, Rh: --/--/O POS (05/17 0030) Antibody: NEG (05/17 0030) Rubella: !Error! Immune RPR: Non Reactive (05/08 2328)  HBsAg: Negative (01/24 1412)  HIV: Non Reactive (01/24 1412)  GBS: Negative (03/11 0000) pending  1 hr Glucola: not done, limited prenatal care (presented to care ~18 wks) Genetic screening:  AFP negative Anatomy US: normal anatomy  Results for orders placed or performed during the hospital encounter of 11/22/16 (from the past 24 hour(s))  Urinalysis, Routine w reflex microscopic   Collection Time: 11/22/16 11:47 PM  Result Value Ref Range   Color, Urine YELLOW YELLOW   APPearance CLEAR CLEAR   Specific Gravity, Urine 1.008 1.005 - 1.030   pH 6.0 5.0 - 8.0   Glucose, UA NEGATIVE NEGATIVE mg/dL   Hgb urine dipstick NEGATIVE NEGATIVE   Bilirubin Urine NEGATIVE NEGATIVE   Ketones, ur NEGATIVE NEGATIVE mg/dL   Protein, ur NEGATIVE NEGATIVE mg/dL   Nitrite NEGATIVE NEGATIVE   Leukocytes, UA MODERATE (A) NEGATIVE   RBC / HPF 0-5 0 - 5 RBC/hpf   WBC, UA 0-5 0 - 5 WBC/hpf   Bacteria, UA RARE (A) NONE SEEN   Squamous Epithelial / LPF 0-5 (A) NONE SEEN   Mucous PRESENT   CBC   Collection Time: 11/23/16 12:30 AM  Result Value Ref Range   WBC 19.5 (H) 4.0 - 10.5 K/uL   RBC 3.24 (L) 3.87 - 5.11 MIL/uL   Hemoglobin 10.8 (L) 12.0 - 15.0 g/dL   HCT 40.9  (L) 81.1 - 46.0 %   MCV 95.4 78.0 - 100.0 fL   MCH 33.3 26.0 - 34.0 pg   MCHC 35.0 30.0 - 36.0 g/dL   RDW 91.4 78.2 - 95.6 %   Platelets 401 (H) 150 - 400 K/uL  Type and screen [facility]   Collection Time: 11/23/16 12:30 AM  Result Value Ref Range   ABO/RH(D) O POS    Antibody Screen NEG    Sample Expiration 11/26/2016  Assessment and Plan: Heron NayJammie Brickle is a 31 yo [id number] at 3233w0d presenting for preterm labor.   #Labor: Expectant management. Will not augment labor at this time as patient is preterm.  #Pain:IV pain meds and epidural upon maternal request #FWB:Cat 1 #ID: GBS Negative #MOF: Breast #MOC: BTL  #Circ: N/A    Tonya AbernethyAbigail J Lancaster, Tonya David PGY-2 11/23/2016, 1:41 AM   David attestation:  I have seen and examined this patient; I agree with above documentation in the resident's note.   Tonya David is a 31 y.o. [id number] here for PTL.  PE: BP (!) 154/87   Pulse 86   Temp 98.2 F (36.8 C) (Oral)   Resp 18   Ht 5\' 7"  (1.702 m)   Wt 74.8 kg (165 lb)   LMP 01/22/2016   SpO2 100%   Breastfeeding? Unknown   BMI 25.84 kg/m   Resp: normal effort, no distress Abd: gravid  ROS, labs, PMH reviewed  [facility]: [facility] Expectant management GBS neg and already rec'd BMZ last week Continue Subutex Plan for SW PP  Tonya David, Tonya David 11/23/2016, 9:14 AM

## 2016-11-23 NOTE — Lactation Note (Signed)
This note was copied from a baby's chart. Lactation Consultation Note  Patient Name: Girl Heron NayJammie Botkins ZOXWR'UToday's Date: 11/23/2016 Reason for consult: Initial assessment;Infant < 6lbs;Late preterm infant Initial visit with this Mom in [place]. [facility] been to breast off/on for 2 hours after delivery. This is Mom's 7th baby, she BF some of her older children for up to 2 months but plans to BF this baby longer. She has had preterm babies in the past. LC reviewed pre-term behaviors and LPT policy with Mom and she plans to supplement with feedings. Reviewed hand expression with Mom and she was able to hand express 715ml+ of colostrum. Encouraged Mom to BF with feeding ques but advised baby may not give feeding ques often due to prematurity so she may need to wake baby to BF if not observing feeding ques by 3 hours from last feeding. Advised lots of STS. Supplemental guidelines per LPT reviewed with Mom. [facility] for [facility]/c home. Advised Mom of need to pump every 3 hours for 15 minutes followed by 5 minutes of hand expression to stimulate breast well for her milk to come to volume. Lactation brochure left for review, advised of OP services and support group. Encouraged to call for assist as needed.   Maternal Data Has patient been taught Hand Expression?: Yes Does the patient have breastfeeding experience prior to this delivery?: Yes  Feeding Feeding Type: Breast Fed Length of feed: 30 min  LATCH Score/Interventions Latch: Grasps breast easily, tongue down, lips flanged, rhythmical sucking.  Audible Swallowing: Spontaneous and intermittent  Type of Nipple: Everted at rest and after stimulation  Comfort (Breast/Nipple): Soft / non-tender     Hold (Positioning): No assistance needed to correctly position infant at breast.  LATCH Score: 10  Lactation Tools Discussed/Used WIC Program: Yes   Consult Status Consult Status: Follow-up Date:  11/24/16 Follow-up type: In-patient    Alfred LevinsGranger, Schneider Warchol Ann 11/23/2016, 11:31 AM

## 2016-11-24 MED ORDER — IBUPROFEN 600 MG PO TABS: 600.0000 mg | ORAL_TABLET | Freq: Four times a day (QID) | ORAL | 0 refills | Status: DC

## 2016-11-24 MED ORDER — BISACODYL 10 MG RE SUPP
10.0000 mg | Freq: Once | RECTAL | Status: AC
  Administered 2016-11-24: 10 mg via RECTAL
  Filled 2016-11-24: qty 1

## 2016-11-24 NOTE — Progress Notes (Signed)
Post Partum Day #1 Subjective: no complaints, up ad lib, voiding, tolerating PO and reports normal lochia  Objective: Blood pressure 128/76, pulse 89, temperature 98.1 F (36.7 C), temperature source Oral, resp. rate 18, height 5\' 7"  (1.702 m), weight 74.8 kg (165 lb), last menstrual period 01/22/2016, SpO2 100 %, unknown if currently breastfeeding.  Physical Exam:  General: alert Lochia: appropriate Uterine Fundus: firm DVT Evaluation: No evidence of DVT seen on physical exam.   Recent Labs  11/23/16 0030  HGB 10.8*  HCT 30.9*    Assessment/Plan: Plan for discharge tomorrow  Considering IUD Bottle feeding   LOS: 1 day   Tonya David 11/24/2016, 7:30 AM

## 2016-11-24 NOTE — Lactation Note (Signed)
This note was copied from a baby's chart. Lactation Consultation Note  Patient Name: Girl Heron NayJammie Mcelwain ZOXWR'UToday's Date: 11/24/2016   Baby 29 hrs old.  3.8%, <5 lbs, 35 weeks.   Baby getting bottles 22 calorie formula.  Mom choosing not to pump as it is causing severe uterine cramping.  Baby will go to breast but she tires after a few minutes. Mom resting in bed with FOB.  Baby STS on Mom's chest.  Mom had eyes closed when LC walked in room, but quickly opened them.  Talked about importance of baby sleeping in crib when she wants to nap.  Mom states baby is fine where she is as she has pillows around her.   Mom to offer breast "so baby can get what she wants", but doesn't plan to breastfeed exclusively.   To call for assistance as needed.      Judee ClaraSmith, Dianna Ewald E 11/24/2016, 1:54 PM

## 2016-11-25 NOTE — Clinical Social Work Maternal (Signed)
  CLINICAL SOCIAL WORK MATERNAL/CHILD NOTE  Patient Details  Name: Tonya David MRN: [id number] Date of Birth: 12/25/1985  Date:  11/25/2016  Clinical Social Worker Initiating Note:  Ferdinand Lango Joseantonio Dittmar, MSW, LCSW-A  Date/ Time Initiated:  11/25/16/1124     Child's Name:  [name]    Legal Guardian:  Other (Comment) (Not established by court system; MOB and FOB Wyatt Portela Green) parent collectively )   Need for Interpreter:  None   Date of Referral:  11/23/16     Reason for Referral:      Referral Source:  RN   Address:  [address] [place], [place] [address]  Phone number:  [phone]   Household Members:  Self, Significant Other, Minor Children   Natural Supports (not living in the home):  Friends, Extended Family, Radiographer, therapeutic Supports: Organized support group (Comment) (Substance support group )   Employment: Unemployed   Type of Work: Unemployed    Education:  9 to 11 years   Museum/gallery curator Resources:  Medicaid   Other Resources:  [facility]   Cultural/Religious Considerations Which May Impact Care:  None reported.   Strengths:  Pediatrician chosen , Compliance with medical plan , Home prepared for child , Ability to meet basic needs  ([place] )   Risk Factors/Current Problems:  Substance Use    Cognitive State:  Alert , Goal Oriented , Able to Concentrate , Insightful    Mood/Affect:  Calm , Comfortable , Interested    CSW Assessment: CSW met with MOB at bedside to complete assessment for consult regarding hx of subutex. Upon this writers arrival, MOB was breast feeding baby and FOB was sitting on the couch engaging her in conversation. This Probation officer explained role and reasoning for visit. This Probation officer explained the hospitals policy and procedure regarding substance and the two drug screens taken of baby. This Probation officer informed MOB that baby's UDS was (-)negative; however, the CDS results are still pending. MOB verbalized understanding  and stated no further questions.   CSW will continue to follow pending CDS results.   CSW Plan/Description:  Information/Referral to [employer] , No Further Intervention Required/No Barriers to Discharge, Patient/Family Education    Water quality scientist, MSW, [facility]  Office: [phone]

## 2016-11-25 NOTE — Discharge Summary (Signed)
Obstetric Discharge Summary Reason for Admission: onset of preterm labor- pt had been admitted from 5/8-5/12 with preterm dilation that stabilized; she came in on 5/17 at 5/90/-2, and within 5 hour spontaneously progressed to delivery; she had rec'd BMZ x 2 during her previous hospitalization Prenatal Procedures: none Intrapartum Procedures: spontaneous vaginal delivery Postpartum Procedures: none Complications-Operative and Postpartum: none Hemoglobin  Date Value Ref Range Status  11/23/2016 10.8 (L) 12.0 - 15.0 g/dL Final   HCT  Date Value Ref Range Status  11/23/2016 30.9 (L) 36.0 - 46.0 % Final   Hematocrit  Date Value Ref Range Status  08/02/2016 CANCELED %     Comment:    [facility] was unable to obtain a suitable specimen for the following test(s), and is providing the patient with recollection instructions.  Result canceled by the ancillary     Physical Exam:  General: alert, cooperative and no distress Lochia: appropriate Uterine Fundus: firm  Discharge Diagnoses: Preterm pregnancy- delivered  Discharge Information: Date: 11/25/2016 Activity: pelvic rest Diet: routine Medications: Ibuprofen Condition: stable Instructions: refer to practice specific booklet Discharge to: home Follow-up Information    Brock BadHarper, Charles A, MD Follow up in 2 week(s).   Specialty:  Obstetrics and Gynecology Why:  For IUD placement Contact information: [address] [place] [address] [phone]           Newborn Data: Live born female  Birth Weight: 4 lb 14.8 oz (2235 g) APGAR: 8, 9  Home with mother.  Cleone SlimCaroline Neill SNM 11/25/2016, 7:31 AM   CNM attestation I have seen and examined this patient and agree with above documentation in the student nurse midwife's note.   Arvin CollardJammie L Deboer is a 31 y.o. [id number] s/p vag preterm delivery.   Pain is well controlled.  Plan for birth control is IUD.  Method of Feeding: bottle  PE:  BP 133/82 (BP  Location: Right Arm)   Pulse 81   Temp 98 F (36.7 C) (Oral)   Resp 14   Ht 5\' 7"  (1.702 m)   Wt 74.8 kg (165 lb)   LMP 01/22/2016   SpO2 99%   Breastfeeding? Unknown   BMI 25.84 kg/m  Fundus firm   Recent Labs  11/23/16 0030  HGB 10.8*  HCT 30.9*     Plan: discharge today - postpartum care discussed - f/u clinic in 2 weeks for postpartum visit/IUD placement   Cam HaiSHAW, Luciana Cammarata, CNM 9:03 AM 11/25/2016

## 2016-11-25 NOTE — Progress Notes (Signed)
Prenatal vitamin given and scanned but [facility]

## 2016-11-25 NOTE — Progress Notes (Signed)
Pt requesting prescription for "something stronger than motrin" to go home with. Dr. Doristine CounterShenk called and pt's request and history reviewed. No prescription to be given. Pt informed.

## 2016-11-25 NOTE — Lactation Note (Signed)
This note was copied from a baby's chart. Lactation Consultation Note  Patient Name: Tonya David RUEAV'WToday's Date: 11/25/2016 Reason for consult: Follow-up assessment;Late preterm infant;Infant < 6lbs  Baby 51 hrs old, weight today 4 lbs 10.7 oz born at 9238w2d.  Weight loss of 5.3%.  Mom had baby on the breast in cradle hold.  Suggested to Mom she support her breast, and switch hands to cross cradle hold.  Demonstrated alternate breast compression to increase milk transfer to baby.  Baby opens widely and latches, but needing some stimulation to continue sucking/swallowing. Some swallows identified.  Mom continuing to want to breastfeed "when she can", but doesn't want to double pump.  She wants to breastfeed but doesn't want to do anything more than offer breast, and then bottle feed.  Demonstrated how to make pump parts into manual pump, and gave Mom a Harmony pump with instructions on use and care.  Mom has WIC, and knows that she can obtain a DEBP from them.  [facility], and OP Lactation services available.   Mom knows to offer baby 30-40 ml of EBM+/formula by bottle >8 times per 24 hrs.  To ask for assistance as needed.  Lactation Tools Discussed/Used WIC Program: Yes Pump Review: Setup, frequency, and cleaning Initiated by:: Johny Blameraroline Jaz Mallick RN IBCLC Date initiated:: 11/25/16   Consult Status Consult Status: Complete Date: 11/25/16 Follow-up type: Call as needed    Judee ClaraSmith, Tatelyn Vanhecke E 11/25/2016, 11:38 AM

## 2016-12-20 ENCOUNTER — Ambulatory Visit (INDEPENDENT_AMBULATORY_CARE_PROVIDER_SITE_OTHER): Payer: Medicaid Other | Admitting: Obstetrics & Gynecology

## 2016-12-20 DIAGNOSIS — Z30430 Encounter for insertion of intrauterine contraceptive device: Secondary | ICD-10-CM

## 2016-12-20 MED ORDER — PARAGARD INTRAUTERINE COPPER IU IUD
1.0000 | INTRAUTERINE_SYSTEM | Freq: Once | INTRAUTERINE | Status: AC
  Administered 2016-12-20: 1 via INTRAUTERINE

## 2016-12-20 NOTE — Patient Instructions (Signed)

## 2016-12-20 NOTE — Progress Notes (Signed)
Post Partum Exam  Tonya CollardJammie L David is a 31 y.o. [id number] female who presents for a postpartum visit. She is 4 weeks postpartum following a spontaneous vaginal delivery. I have fully reviewed the prenatal and intrapartum course. The delivery was at 35 gestational weeks.  Anesthesia: epidural. Postpartum course has been unremarkable. Baby's course has been unremarkable. Baby is feeding by both breast and bottle - Similac Neosure. Bleeding bleeding since NSVD. Today just spotting. . Bowel function is normal. Bladder function is normal. Patient is not sexually active. Contraception method is none. Postpartum depression screening:neg EPDS: 2. Pt wants Paragard. OFFICE SUPPLY PARAGARD   The following portions of the patient's history were reviewed and updated as appropriate: allergies, current medications, past family history, past medical history, past social history, past surgical history and problem list.  Review of Systems Pertinent items are noted in HPI.    Objective:  unknown if currently breastfeeding.  General:  alert, cooperative and no distress                 Vagina: normal vagina  Cervix:  no lesions  Corpus: well supported  Adnexa:  not evaluated  Rectal Exam: Not performed.          GYNECOLOGY OFFICE PROCEDURE NOTE  Tonya David is a 31 y.o. [id number] here for Liletta IUD insertion. No GYN concerns.  Last pap smear was on 03/2016 [facility]  IUD Insertion Procedure Note Patient identified, informed consent performed, consent signed.   Discussed risks of irregular bleeding, cramping, infection, malpositioning or misplacement of the IUD outside the uterus which may require further procedure such as laparoscopy. Time out was performed.  Urine pregnancy test negative.  Speculum placed in the vagina.  Cervix visualized.  Cleaned with Betadine x 2.  Grasped anteriorly with a single tooth tenaculum.  Uterus sounded to 8 cm.  Paragard placed per manufacturer's recommendations.   Strings trimmed to 3 cm. Tenaculum was removed, good hemostasis noted.  Patient tolerated procedure well.   Patient was given post-procedure instructions.  She was advised to have backup contraception for one week.  Patient was also asked to check IUD strings periodically and follow up in 4 weeks for IUD check.    Assessment:    normal postpartum exam. Pap smear not done at today's visit.   Plan:   1. Contraception: IUD 2. Paragard placed 3. Follow up in: 4 weeks or as needed.  String check and colpo  Adam PhenixArnold, James G, MD 12/20/2016

## 2016-12-21 ENCOUNTER — Encounter: Payer: Self-pay | Admitting: Obstetrics & Gynecology

## 2017-01-17 ENCOUNTER — Ambulatory Visit: Payer: Medicaid Other | Admitting: Obstetrics & Gynecology

## 2017-11-07 IMAGING — US US OB TRANSVAGINAL
1 series · 15 of 28 positions shown · non-contrast
Comparison: None

CLINICAL DATA: Pelvic pain, dating.

EXAM:
OBSTETRIC <14 WK US AND TRANSVAGINAL OB US
TECHNIQUE: Both transabdominal and transvaginal ultrasound examinations were
performed for complete evaluation of the gestation as well as the
maternal uterus, adnexal regions, and pelvic cul-de-sac.
Transvaginal technique was performed to assess early pregnancy.

[Series 1: us ob transvaginal · 15 of 62 slices shown]
[im 1/62]
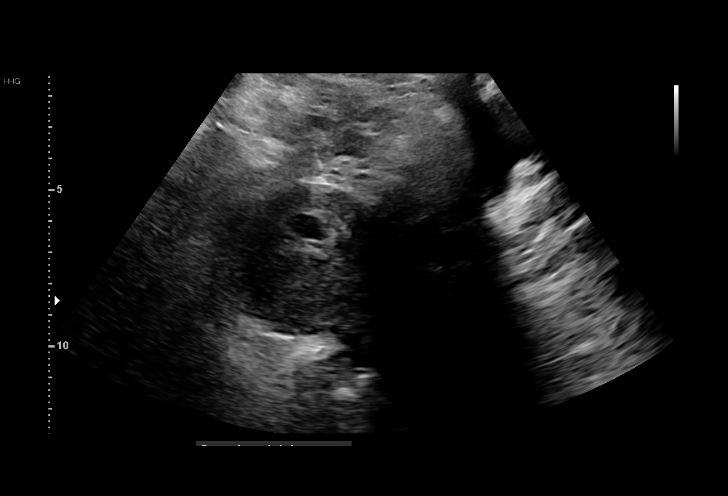
[im 5/62]
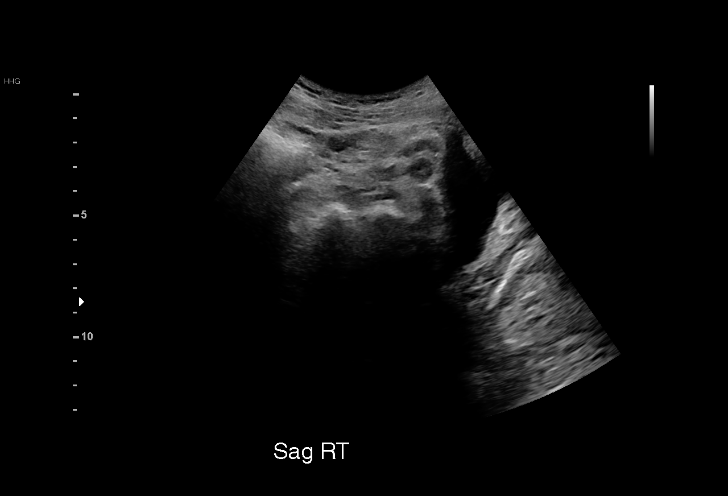
[im 10/62]
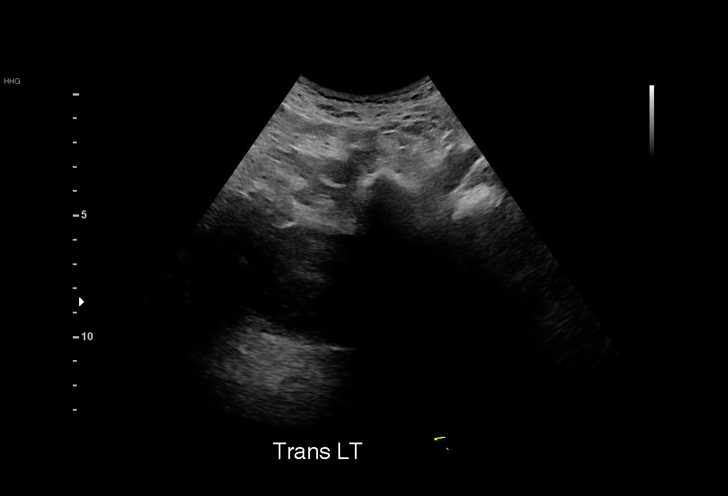
[im 14/62]
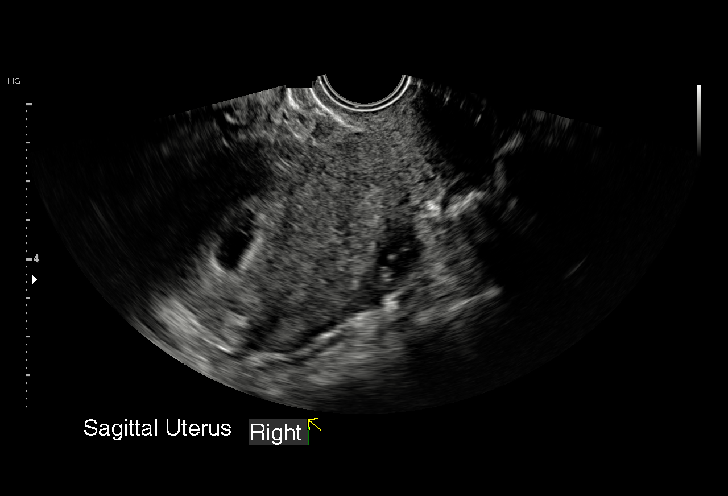
[im 19/62]
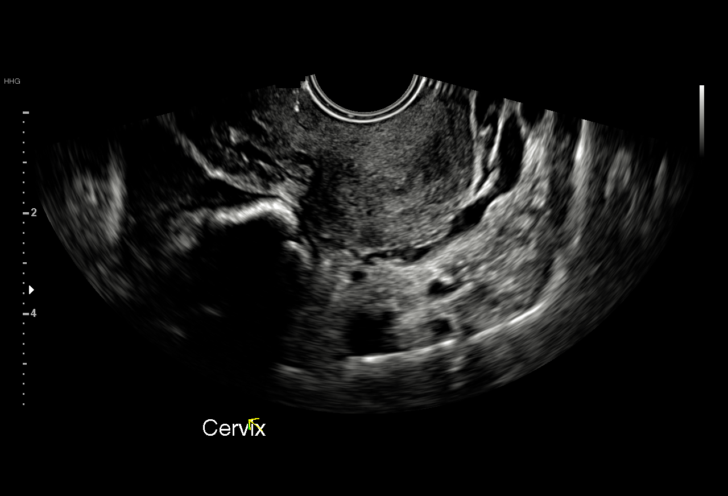
[im 23/62]
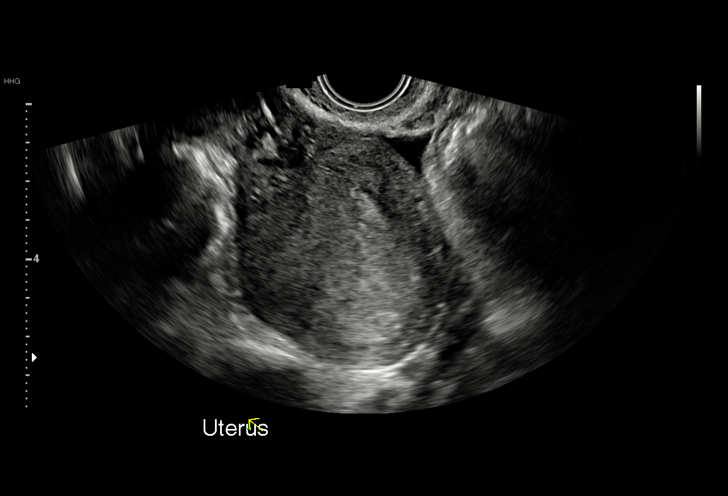
[im 28/62]
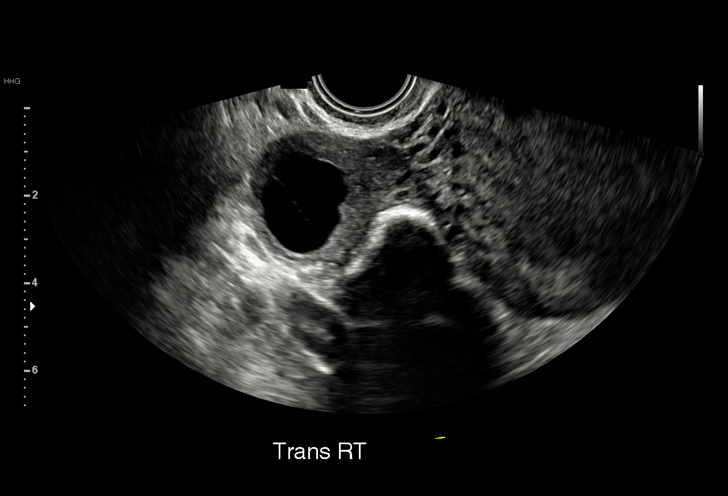
[im 32/62]
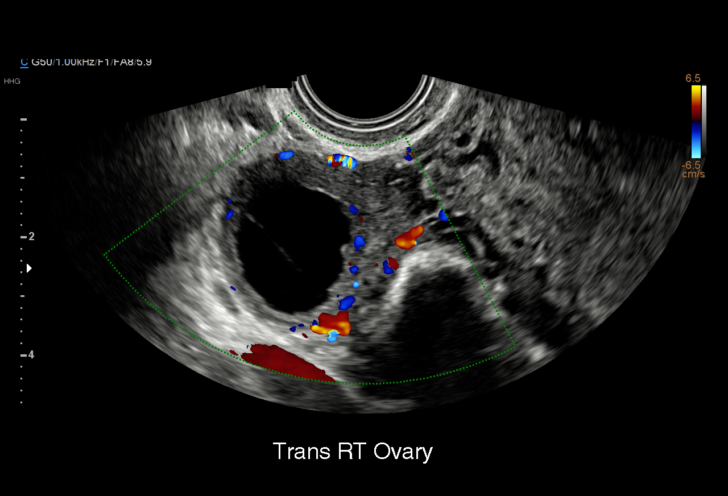
[im 34/62]
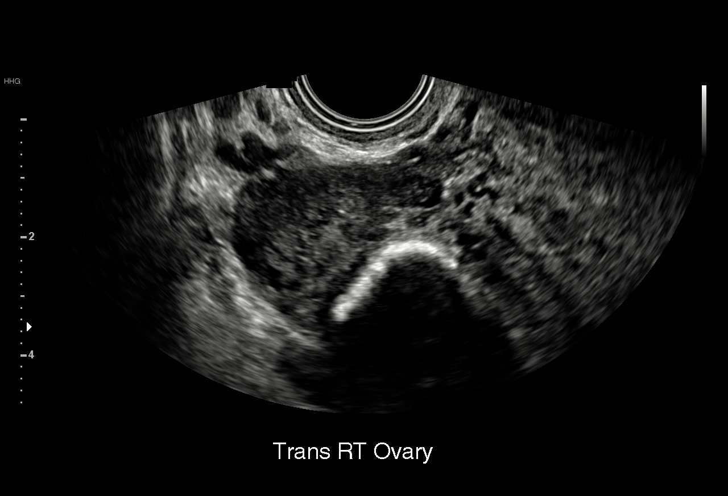
[im 39/62]
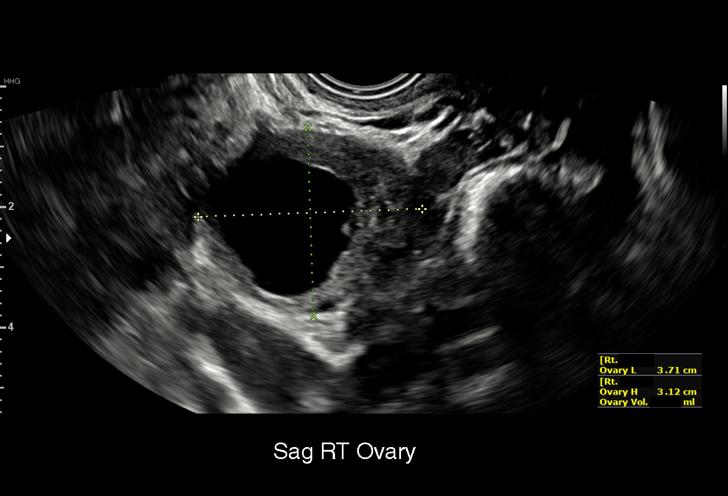
[im 43/62]
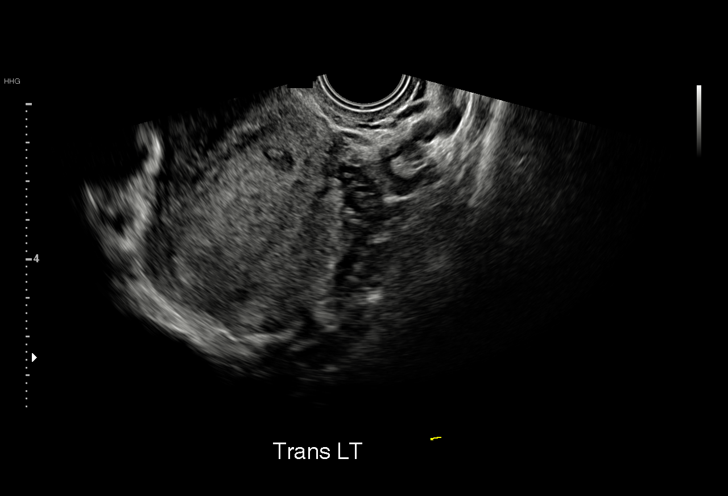
[im 48/62]
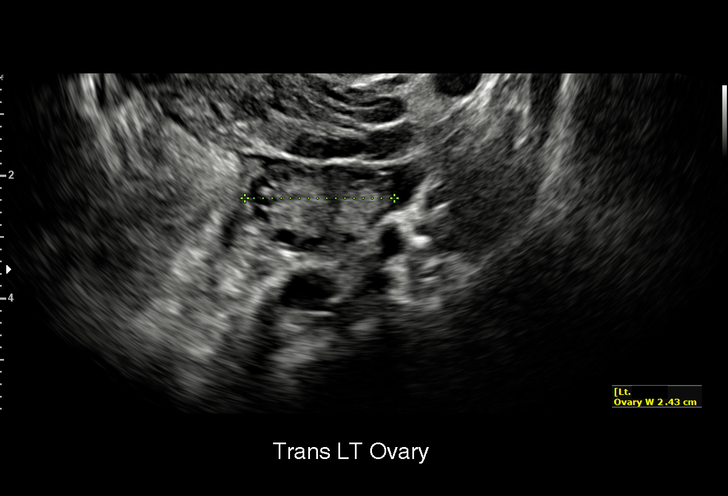
[im 52/62]
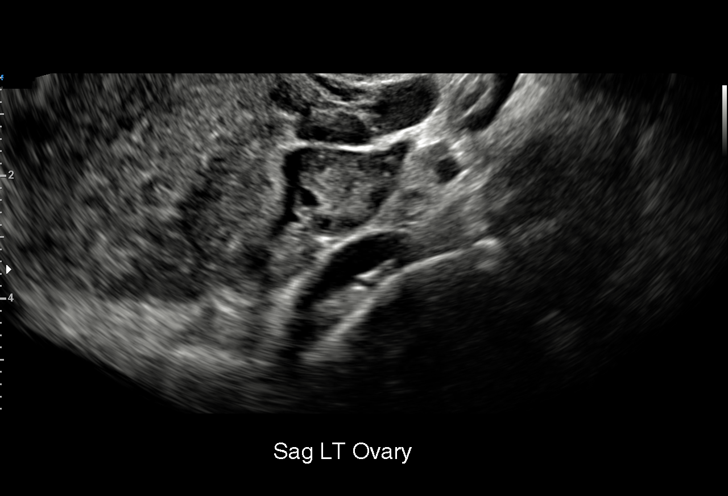
[im 57/62]
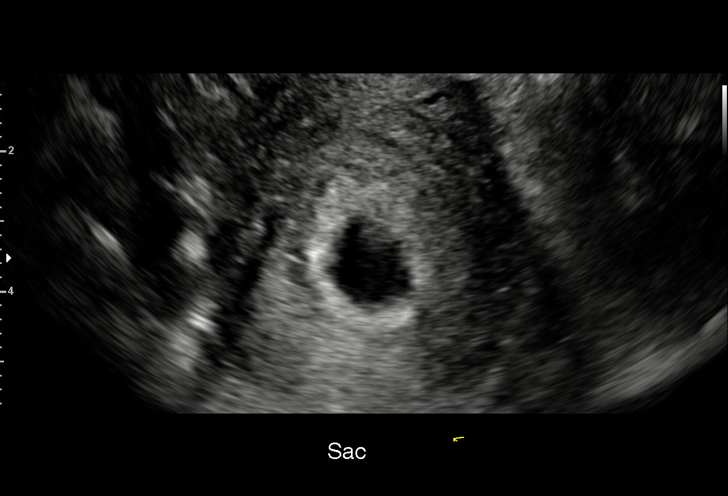
[im 62/62]
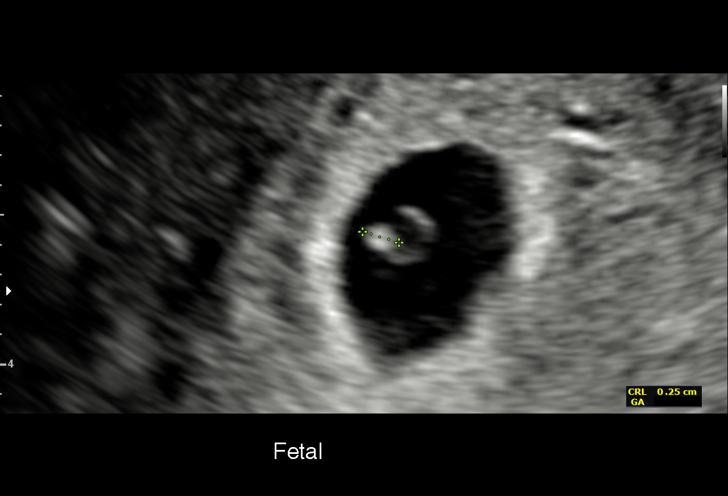

[15 of 28 positions shown; findings below may reference images not displayed]

FINDINGS: Intrauterine gestational sac: Visualized, single

Yolk sac:  Visualized

Embryo:  Visualized

Cardiac Activity: Visualized

Heart Rate: 108  bpm

MSD:   mm    w     d

CRL:  2.6  mm   5 w   5 d                  US EDC: 12/28/2016

Subchorionic hemorrhage:  None visualized.

Maternal uterus/adnexae: Right corpus luteum cyst. No adnexal
masses. Small free fluid in the pelvis.
IMPRESSION: Five week 5 day intrauterine pregnancy. Fetal heart rate 108 beats
per minute. No acute maternal findings.

## 2018-07-30 ENCOUNTER — Telehealth: Payer: Self-pay | Admitting: *Deleted

## 2018-07-30 NOTE — Telephone Encounter (Signed)
Pt calls and states she would like an appt with the OUD clinic. 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 She states she has 7 children, works full time and owns a business. She did have full time therapy for 2 years and feels she is in a good place and would really like to start trying to wean suboxone but needs direction from a physician for that so she googled suboxone clinics and [facility] came up first. She would like an appt for 1/28 if available. Offer her an appt?

## 2018-07-31 NOTE — Telephone Encounter (Signed)
Ok by me. I can see her next week as long as schedule isn't too full.

## 2018-07-31 NOTE — Telephone Encounter (Signed)
appt 1/28 at 1045

## 2018-08-06 ENCOUNTER — Telehealth: Payer: Self-pay | Admitting: *Deleted

## 2018-08-06 NOTE — Telephone Encounter (Signed)
Attempted to call to reschedule OUD clinic appt, no answer

## 2018-08-13 ENCOUNTER — Ambulatory Visit (INDEPENDENT_AMBULATORY_CARE_PROVIDER_SITE_OTHER): Payer: Medicaid Other | Admitting: Internal Medicine

## 2018-08-13 ENCOUNTER — Other Ambulatory Visit: Payer: Self-pay

## 2018-08-13 VITALS — BP 122/71 | HR 89 | Temp 98.9°F | Ht 66.0 in | Wt 129.9 lb

## 2018-08-13 DIAGNOSIS — F1120 Opioid dependence, uncomplicated: Secondary | ICD-10-CM | POA: Diagnosis not present

## 2018-08-13 DIAGNOSIS — F1121 Opioid dependence, in remission: Secondary | ICD-10-CM | POA: Diagnosis present

## 2018-08-13 MED ORDER — BUPRENORPHINE HCL-NALOXONE HCL 8-2 MG SL FILM: ORAL_FILM | SUBLINGUAL | 0 refills | Status: DC

## 2018-08-13 NOTE — Progress Notes (Signed)
08/13/2018  Tonya David presents for buprenorphine/naloxone intake visit.   I have reviewed EPIC data including labwork which was available.  I have reviewed outside records provided by patient if available.  The salient points were confirmed with the patient.    Review of substance use history (first use, substances used, any illicit purchases): Percocet at the age of ~28 after he second child when she became tolerant. She was treated with subutex and then on suboxone when she became pregnant. [facility]. She is initiating treatment today with the [facility] O[facility] clinic as she felt that the weekly counseling and AA meetings were problematic given her time commitments. She no longer feels she needs this additional assistance as she is not in a place in her life where she feel she needs this kind of assistance. She denied cravings today.  Last substance used: Suboxone yesterday, 8mg  Q 8 hrs  If last substance not an opioid, last opioid used (type, dose, route, withdrawal symptoms): Symptoms such as severe bone pain, diaphoresis, diarrhea, vomiting, chills, myalgias.   Mental Health History: Denied  Current counseling/behavioural health provider: N/A  This patient has Opioid Use Disorder by following DSM-V criteria: Keep those that apply.  - Opioids taken in larger amounts or over a longer period than intended - Persistent desire to cut down - A great deal of time is spent to obtain/use/recover from the opioid - Cravings to use opioids - Use resulting in a failure to fulfill major role obligations - Continue opioid use despite persistent social or interpersonal problems - Important activities are given up or reduced because of opioid use - Recurrent opioid use in situations in which it is physically hazardous - Use despite knowledge of health problems caused by opioids - Tolerance - Withdrawal  Past Medical History:  Diagnosis Date  . Anemia     . Headache(784.0)   . Preterm labor   . Urinary tract infection     Current Outpatient Medications on File Prior to Visit  Medication Sig Dispense Refill  . acetaminophen (TYLENOL) 500 MG tablet Take 1,000 mg by mouth 2 (two) times daily as needed for moderate pain.    Marland Kitchen aspirin-sod bicarb-citric acid (ALKA-SELTZER) 325 MG TBEF tablet Take 325 mg by mouth every 6 (six) hours as needed (stomach).    . buprenorphine (SUBUTEX) 8 MG SUBL SL tablet Place 8 mg under the tongue 3 (three) times daily.   0  . ibuprofen (ADVIL,MOTRIN) 600 MG tablet Take 1 tablet (600 mg total) by mouth every 6 (six) hours. (Patient not taking: Reported on 12/20/2016) 30 tablet 0  . loratadine (CLARITIN) 10 MG tablet Take 10 mg by mouth daily as needed for allergies.    . Prenatal Vit-Fe Fumarate-FA (MULTIVITAMIN-PRENATAL) 27-0.8 MG TABS tablet Take 1 tablet by mouth daily. (Patient not taking: Reported on 12/20/2016) 30 each 9  . [DISCONTINUED] Norethindrone Acetate-Ethinyl Estrad-FE (LOESTRIN 24 FE) 1-20 MG-MCG(24) tablet Take 1 tablet by mouth 1 day or 1 dose. Start in 3 weeks (Patient not taking: Reported on 08/24/2014) 1 Package 3   No current facility-administered medications on file prior to visit.     Physical Exam  Vitals:   08/13/18 1133  BP: 122/71  Pulse: 89  Temp: 98.9 F (37.2 C)  TempSrc: Oral  SpO2: 100%  Weight: 129 lb 14.4 oz (58.9 kg)  Height: 5\' 6"  (1.676 m)    Clinical Opiate Withdrawal Scale: bold applicable COWS scoring   -  Resting HR:    - 0 for < 80   - 1 for 81 - 100   - 2 for 101 - 120   - 4 for > 120  - Sweating:   - 0 for no chills/flushing   - 1 for subjective chills/flushing   - 3 for beads of sweat on brow/face   - 4 for sweat streaming off of face  - Restlessness:    - 0 for able to sit still   - 1 for subjective difficulty sitting still   - 3 for frequent shifting or extraneous movement   - 5 for unable to sit still for more than a few seconds  - Pupil size:     - 0 for pinpoint or normal   - 1 for possibly larger than normal   - 2 for moderately dilated   - 5 for only iris rim visible  - Bone/joint pain:    - 0 for not present   - 1 for mild diffuse discomfort   - 2 severe diffuse aching   - 4 for objectively rubbing joints/muscles and obviously in pain  - Runny nose/tearing:    - 0 for not present   - 1 for stuffy nose/moist eyes   - 2 for nose running/tearing   - 4 for nose constantly running or tears streaming down cheeks  - GI Upset:    - 0 for no GI symptoms   - 1 for stomach cramps   - 2 for nausea or loose stool   - 3 for vomiting or diarrhea   - 5 for multiple episodes of vomiting or diarrhea  - Tremor observation of outstretched hands:    - 0 for no tremor   - 1 for tremor can be felt but not observed   - 2 for slight tremor observable   - 4 for gross tremor or muscle twitching  - Yawning:    - 0 for no yawning   - 1 for yawning once or twice during assessment   - 2 for yawning three or more times during assessment   - 4 for yawning several times per minute  - Anxiety or irritability:    - 0 for none   - 1 for patient reports increasing irritability or anxiousness   - 2 for patient obviously irritable/anxious   - 4 for patient so irritable/anxious that assessment is difficult  - Gooseflesh:    - 0 for skin is smooth   - 3 for piloerection of skin can be felt or seen   - 5 for prominent piloerection  TOTAL: 1  Assessment/Plan:   Based on a review of the patient's medical history including substance use and mental health factors, and physical exam, Tonya David is a suitable candidate for MAT with buprenorphine/naloxone.  UDS ordered this visit.    I have discussed HIV and Hepatitis C screening with this patient.    I will place orders for CMET for baseline LFT evaluation if needed.    Home Induction:   I have instructed the patient how to appropriately take this medication, including placing under the  tongue with head relaxed for 10 minutes and allowing to dissolve without chewing or swallowing tab/film, and with nothing to eat or drink in the subsequent 15 minutes.  They have been told not to start taking the medication until they have significant signs of withdrawal and I have explained the concept of precipitated withdrawal with the patient.  Intervisit Care:  I will call the patient the morning after induction to assess symptom burden and determine the need for additional dose titration.  We discussed this medication must be kept in a safe place and away from children.   We will see the patient back in 1 week in clinic, with options for a sooner appointment based on patient and provider preference.   Patient was encouraged to call the office and speak with the MD on call for any urgent concerns.    [name], MD 08/13/2018 12:03 PM

## 2018-08-13 NOTE — Assessment & Plan Note (Addendum)
The patient has been on suboxone therapy and would like to transition clinics as she feels her current/prior clinic, the [facility] requires substantially greater commitment and counseling than she is in need of in this stage of her life. She denied craving opioid, recent withdrawal or other concerns with her suboxone. She simply did not wish to attend the weekly NAA and counseling sessions required at the [facility]. I informed her that these were important aspects of her treatment but understood that they could be burdensome given her home life requirements and three weekly visits.  She appears stable today on her dose, absent withdrawal symptoms.  Database search revealed that she started suboxone 4mg  BID on December 4th but was only provided with a refill for 7 days on January 4th. She stated that she has wanted to stop using the medication recently. She has been conserving her medication recently.   Plan: We will obtain a urine toxicology screen today CMP for liver profile HIV and HCV screening ordered

## 2018-08-13 NOTE — Patient Instructions (Signed)

## 2018-08-14 ENCOUNTER — Telehealth: Payer: Self-pay | Admitting: Internal Medicine

## 2018-08-14 LAB — CMP14 + ANION GAP
ALT: 10 IU/L (ref 0–32)
AST: 12 IU/L (ref 0–40)
Albumin/Globulin Ratio: 1.7 (ref 1.2–2.2)
Albumin: 4.7 g/dL (ref 3.9–5.0)
Alkaline Phosphatase: 63 IU/L (ref 39–117)
Anion Gap: 17 mmol/L (ref 10.0–18.0)
BUN/Creatinine Ratio: 19 (ref 9–23)
BUN: 12 mg/dL (ref 6–20)
CO2: 21 mmol/L (ref 20–29)
Calcium: 9.4 mg/dL (ref 8.7–10.2)
Chloride: 104 mmol/L (ref 96–106)
Creatinine, Ser: 0.62 mg/dL (ref 0.57–1.00)
GFR calc non Af Amer: 121 mL/min/{1.73_m2} (ref >59)
GFR, EST AFRICAN AMERICAN: 140 mL/min/{1.73_m2} (ref >59)
GLUCOSE: 98 mg/dL (ref 65–99)
Globulin, Total: 2.7 g/dL (ref 1.5–4.5)
POTASSIUM: 3.9 mmol/L (ref 3.5–5.2)
Sodium: 142 mmol/L (ref 134–144)
TOTAL PROTEIN: 7.4 g/dL (ref 6.0–8.5)

## 2018-08-14 LAB — HIV ANTIBODY (ROUTINE TESTING W REFLEX): HIV SCREEN 4TH GENERATION: NONREACTIVE

## 2018-08-14 LAB — HEPATITIS C ANTIBODY

## 2018-08-14 NOTE — Progress Notes (Signed)
Internal Medicine Clinic Attending  I saw and evaluated the patient.  I personally confirmed the key portions of the history and exam documented by Dr. Crista Elliot and I reviewed pertinent patient test results.  The assessment, diagnosis, and plan were formulated together and I agree with the documentation in the resident's note.    As noted this is a 33 year old female with past medical history of moderate OUD from prescription opioids.  She was previously doing very well on Suboxone therapy however recently fell out of care and felt like some of the additional counseling was a little too much for her and caring for her numerous young children she did feel like Suboxone therapy was helpful.  She heard about our clinic and was hopeful to be able to continue Suboxone therapy.  She reports that most recently she was taking 3 films of 8-2 mg Suboxone a day.  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  Occasionally on less busy times of the year her cravings go down and she was able to do 2 films a day.  I feel that she is a good candidate for Suboxone therapy will resume her Suboxone prescription at a total daily dose of 20 mg or 2-1/2 films a day.  Have obtained a urine drug screen today per her report I expect Suboxone to be present without other substances as she reported she has still been trying to take a low dose of left over films/conserving.  I will plan to see her back in 2 weeks.

## 2018-08-14 NOTE — Telephone Encounter (Signed)
Notified patient of normal lab results via phone with regard to her Hep C, HIV and CMP. She stated that she understood. Will see her for routine follow-up.   [name], MD [facility] Internal Medicine, PGY-2

## 2018-08-18 LAB — TOXASSURE SELECT,+ANTIDEPR,UR

## 2018-08-27 ENCOUNTER — Other Ambulatory Visit: Payer: Self-pay

## 2018-08-27 ENCOUNTER — Ambulatory Visit (INDEPENDENT_AMBULATORY_CARE_PROVIDER_SITE_OTHER): Payer: Medicaid Other | Admitting: Internal Medicine

## 2018-08-27 VITALS — BP 147/83 | HR 97 | Temp 98.8°F | Ht 66.0 in | Wt 133.9 lb

## 2018-08-27 DIAGNOSIS — F1120 Opioid dependence, uncomplicated: Secondary | ICD-10-CM | POA: Diagnosis not present

## 2018-08-27 DIAGNOSIS — F1121 Opioid dependence, in remission: Secondary | ICD-10-CM

## 2018-08-27 MED ORDER — BUPRENORPHINE HCL-NALOXONE HCL 8-2 MG SL FILM: ORAL_FILM | SUBLINGUAL | 0 refills | Status: DC

## 2018-08-27 NOTE — Progress Notes (Signed)
   08/27/2018  Tonya David presents for follow up of opioid use disorder I have reviewed the prior induction visit, follow up visits, and telephone encounters relevant to opiate use disorder (OUD) treatment.   Current daily dose: Suboxone 20mg  daily (2.5 films daily)  Date of Induction: 08/14/2018 with the [facility] OUD clinic   Current follow up interval, in weeks: 4 weeks  The patient has been adherent with the buprenorphine for OUD contract.    Last UDS Result: Expected buprenorphine but with amphetamine.   HPI: Tonya David is a 33 year old female who presents today for evaluation of chronic opioid use disorder. She denied acute concerns, withdrawals or cravings. We discussed her positive result for amphetamine but stated that she utilized this to help her bridge given her lack of sufficient suboxone to last from previous treatment clinic. She reported that the amphetamine decreased her withdrawal carvings.  Exam:   Vitals:   08/27/18 1134  BP: (!) 147/83  Pulse: 97  Temp: 98.8 F (37.1 C)  TempSrc: Oral  SpO2: 98%  Weight: 133 lb 14.4 oz (60.7 kg)  Height: 5\' 6"  (1.676 m)   General: A/O x4, in no acute distress, afebrile, nondiaphoretic Cardio: RRR, no mrg's Pulmonary: CTA bilaterally MSK: BLE nontender, nonedematous Psych: Appropriate affect, not depressed in appearance, engages well  Assessment/Plan:  See Problem Based Charting in the Encounters Tab   [name], MD  08/27/2018  11:58 AM

## 2018-08-27 NOTE — Assessment & Plan Note (Signed)
The patient continues to be stable on her current regimen. Appears appropriate for continued therapy. She denied recent use of other such substances illicit or prescribed and was advised that she should discontinue use of all similar unprescribed substances including marijuana.   Amphetamine Urine tox screen positive, discussed HIV, Hep C and CMP unremarkable, patient informed of results.   Plan: Return in four weeks Continue suboxone 20mg  daily No U-tox ordered today.

## 2018-08-27 NOTE — Patient Instructions (Signed)
Please return in two weeks.

## 2018-08-27 NOTE — Progress Notes (Signed)
Internal Medicine Clinic Attending  I saw and evaluated the patient.  I personally confirmed the key portions of the history and exam documented by Dr. Harbrecht and I reviewed pertinent patient test results.  The assessment, diagnosis, and plan were formulated together and I agree with the documentation in the resident's note.  

## 2018-09-23 ENCOUNTER — Other Ambulatory Visit: Payer: Self-pay | Admitting: Internal Medicine

## 2018-09-23 MED ORDER — BUPRENORPHINE HCL-NALOXONE HCL 8-2 MG SL FILM: ORAL_FILM | SUBLINGUAL | 0 refills | Status: DC

## 2018-09-23 NOTE — Telephone Encounter (Signed)
Pls pt contact [phone]

## 2018-09-23 NOTE — Telephone Encounter (Signed)
Returned call, she is doing well on suboxone.  Provided 1 month refill, patient to call back in aobut 3 weeks (1 week prior to refill being due to determine next steps.)

## 2018-10-14 ENCOUNTER — Other Ambulatory Visit: Payer: Self-pay | Admitting: Internal Medicine

## 2018-10-15 MED ORDER — BUPRENORPHINE HCL-NALOXONE HCL 8-2 MG SL FILM: ORAL_FILM | SUBLINGUAL | 0 refills | Status: DC

## 2018-10-23 ENCOUNTER — Encounter: Payer: Medicaid Other | Admitting: Internal Medicine

## 2018-10-23 ENCOUNTER — Other Ambulatory Visit: Payer: Self-pay

## 2018-11-15 ENCOUNTER — Encounter: Payer: Self-pay | Admitting: Internal Medicine

## 2018-11-15 ENCOUNTER — Other Ambulatory Visit: Payer: Self-pay | Admitting: Internal Medicine

## 2018-11-15 MED ORDER — BUPRENORPHINE HCL-NALOXONE HCL 8-2 MG SL FILM: 1.0000 | ORAL_FILM | Freq: Three times a day (TID) | SUBLINGUAL | 0 refills | Status: DC

## 2018-12-09 ENCOUNTER — Encounter: Payer: Self-pay | Admitting: Internal Medicine

## 2018-12-09 ENCOUNTER — Telehealth: Payer: Self-pay | Admitting: *Deleted

## 2018-12-09 NOTE — Telephone Encounter (Signed)
Pt calls and states she is not comfortable coming into clinic, not for anything, she states she has 7 children and does not have childcare either. She was ask when she might be able to come in for appt face to face. She states she does not know when but "not anytime soon". She would like a refill

## 2018-12-10 ENCOUNTER — Other Ambulatory Visit: Payer: Self-pay

## 2018-12-10 NOTE — Telephone Encounter (Signed)
Lets set up a phone visit for today instead of inperson. Can you let me know a time that will work and I will call her.

## 2018-12-17 ENCOUNTER — Other Ambulatory Visit: Payer: Self-pay | Admitting: Internal Medicine

## 2018-12-18 ENCOUNTER — Other Ambulatory Visit: Payer: Self-pay

## 2018-12-18 ENCOUNTER — Ambulatory Visit (INDEPENDENT_AMBULATORY_CARE_PROVIDER_SITE_OTHER): Payer: Medicaid Other | Admitting: Student in an Organized Health Care Education/Training Program

## 2018-12-18 DIAGNOSIS — F1121 Opioid dependence, in remission: Secondary | ICD-10-CM

## 2018-12-18 DIAGNOSIS — F1120 Opioid dependence, uncomplicated: Secondary | ICD-10-CM

## 2018-12-18 MED ORDER — BUPRENORPHINE HCL-NALOXONE HCL 8-2 MG SL FILM: 1.0000 | ORAL_FILM | Freq: Three times a day (TID) | SUBLINGUAL | 0 refills | Status: DC

## 2018-12-18 NOTE — Progress Notes (Signed)
   This is a telephone encounter between Cherlyn Labella and Lalla Brothers, MD on 12/18/2018 for opioid use disorder. The visit was conducted with the patient located at home and Lalla Brothers at the [facility]. The patient's identity was confirmed using their DOB. The patient has consented to being evaluated through a telephone encounter and understands the associated risks/benefits. I personally spent 8 minutes on medical discussion.    Assessment and Plan:  See Encounters tab for problem-based medical decision making.   __________________________________________________________  HPI:   33 year old person presenting over the phone for follow up of opioid use disorder.  Patient has been on treatment with Suboxone for opioid use disorder for a little over 2 years.  She established care with our clinic in early February.  She has been doing well with Suboxone, takes 24 mg daily in divided doses.  Reports cravings are well controlled.  Denies any relapses.  She lives at home, with family, has 7 children.  She has been compliant with physical distancing recommendations due to COVID-19 pandemic. She has anxiety about coming into clinic or other healthcare facilities, which is reasonable.  She reports a normal mood otherwise.  No fevers or chills.  No other complaints.  __________________________________________________________  Problem List: Patient Active Problem List   Diagnosis Date Noted  . Opioid use disorder, moderate, in early remission, dependence ([place]) 08/13/2018  . History of preterm delivery 03/14/2012    Medications: Reconciled today in Epic __________________________________________________________

## 2018-12-18 NOTE — Patient Instructions (Signed)
Tele-visit, instructions given verbally

## 2018-12-18 NOTE — Telephone Encounter (Signed)
Can we offer this person a televisit for today? If she agrees, add her to the [facility] schedule and I will call her for an oud visit. Thanks.

## 2018-12-18 NOTE — Telephone Encounter (Signed)
I tried reaching this patient last week for a phone visit, but was unable to connect with her. Will try to call her again today.

## 2018-12-18 NOTE — Assessment & Plan Note (Signed)
Stable, doing well.  Plan is to continue with Suboxone 8 mg film 3 times daily.  I electronically sent a 1 month supply to her pharmacy.  Last urine toxicology screen in February was appropriate.  We talked about that there is a need for an in person visit soon, at least to check a another urine toxicology screen.  We set a goal for return to clinic in 1 month.  Hopefully burden of COVID-19 disease and our community will be low by that time.  Offered the patient telephone counseling with [facility], currently she declines.  I reviewed the database which showed appropriate dispensing.

## 2018-12-18 NOTE — Telephone Encounter (Signed)
Call placed to patient. Explained Dr. Evette Doffing would like to do telehealth visit today. She will stay by phone. Confirmed Pharmacy listed is correct. Hubbard Hartshorn, RN, BSN

## 2019-01-16 ENCOUNTER — Other Ambulatory Visit: Payer: Self-pay | Admitting: Student in an Organized Health Care Education/Training Program

## 2019-01-16 MED ORDER — BUPRENORPHINE HCL-NALOXONE HCL 8-2 MG SL FILM: 1.0000 | ORAL_FILM | Freq: Three times a day (TID) | SUBLINGUAL | 0 refills | Status: DC

## 2019-01-21 ENCOUNTER — Ambulatory Visit: Payer: Medicaid Other | Admitting: Student in an Organized Health Care Education/Training Program

## 2019-01-21 ENCOUNTER — Other Ambulatory Visit: Payer: Self-pay

## 2019-01-21 DIAGNOSIS — F1121 Opioid dependence, in remission: Secondary | ICD-10-CM

## 2019-01-21 NOTE — Progress Notes (Signed)
I tried calling twice, no answer for our televisit this morning. Will try again later.

## 2019-01-21 NOTE — Progress Notes (Signed)
I tried calling a third and final time. Left a VM for her to call the clinic back so we can reschedule another televisit for the future.

## 2019-02-13 ENCOUNTER — Other Ambulatory Visit: Payer: Self-pay | Admitting: Student in an Organized Health Care Education/Training Program

## 2019-02-14 MED ORDER — BUPRENORPHINE HCL-NALOXONE HCL 8-2 MG SL FILM: 1.0000 | ORAL_FILM | Freq: Three times a day (TID) | SUBLINGUAL | 0 refills | Status: DC

## 2019-02-14 NOTE — Telephone Encounter (Signed)
Patient missed our televisit in July and has no future visits scheduled. I will approve a one week supply of suboxone only. I would like her to see [place] in person in the [facility] clinic on 8/11.

## 2019-02-17 NOTE — Telephone Encounter (Signed)
Done 8/7

## 2019-02-17 NOTE — Telephone Encounter (Signed)
Patient will be at [facility] at 1000 on 02/18/2019. Hubbard Hartshorn, RN, BSN

## 2019-02-18 ENCOUNTER — Telehealth: Payer: Self-pay | Admitting: Student in an Organized Health Care Education/Training Program

## 2019-02-18 ENCOUNTER — Telehealth: Payer: Self-pay

## 2019-02-18 MED ORDER — BUPRENORPHINE HCL-NALOXONE HCL 8-2 MG SL FILM: ORAL_FILM | SUBLINGUAL | 0 refills | Status: DC

## 2019-02-18 NOTE — Telephone Encounter (Signed)
Please call pt back.

## 2019-02-18 NOTE — Telephone Encounter (Signed)
Spoke with Mrs. Kocsis over the phone today. She tells me she had to cancel our OUD visit today because her husband was unexpectedly called away and no one else was available to watch her children. We clarified the issue about being seen in the clinic. She is reasonably worried about COVID, but she understands the need for an inperson visit. She is frustrated by all the rules around the suboxone prescribing and wants to try to taper off over the next few months. She hasn't used percocet in over 3 years and feels no cravings. I think this is a reasonable plan.   We are going to decrease suboxone to 2.5 films daily for the next 2 weeks. She is going to see [place] in [facility] on 8/25 at 9:45am. We will see how her symptoms are and continue a slow taper from there.   Also, she apologized for raising her voice to [facility]. She was frustrated and worried about not getting the refill in time.

## 2019-03-04 ENCOUNTER — Other Ambulatory Visit: Payer: Self-pay

## 2019-03-04 ENCOUNTER — Ambulatory Visit: Payer: Medicaid Other | Admitting: Internal Medicine

## 2019-03-04 VITALS — BP 116/61 | HR 96 | Temp 98.4°F | Ht 66.0 in | Wt 134.2 lb

## 2019-03-04 DIAGNOSIS — F1120 Opioid dependence, uncomplicated: Secondary | ICD-10-CM

## 2019-03-04 DIAGNOSIS — F1121 Opioid dependence, in remission: Secondary | ICD-10-CM

## 2019-03-04 MED ORDER — BUPRENORPHINE HCL-NALOXONE HCL 8-2 MG SL FILM: 1.0000 | ORAL_FILM | Freq: Two times a day (BID) | SUBLINGUAL | 0 refills | Status: DC

## 2019-03-04 NOTE — Patient Instructions (Signed)
Thank you for allowing [place] to provide your care today. Today we discussed your opioid use disorder  Today we made the following changes to your medications:  Please take suboxone 8-2mg  film twice a day  Please call and let us know if you experience significant withdrawal symptoms.  Please follow-up in 4 weeks.    Should you have any questions or concerns please call the internal medicine clinic at [phone].     Opioid Withdrawal Opioids are powerful substances that relieve pain. Opioids include illegal drugs, such as heroin, as well as prescription pain medicines, such as codeine, morphine, hydrocodone, oxycodone, and fentanyl. Opioid withdrawal is a group of symptoms that can happen if you have been taking opioids for a long time and suddenly stop. What are the causes? This condition is caused by taking opioids for weeks and then doing any of the following:  Stopping use.  Rapidly reducing use.  Taking a medicine to block their effect. What increases the risk? This condition is more likely to develop in:  People who take opioids incorrectly.  People who take opioids for a long period of time. What are the signs or symptoms? Symptoms of this condition can be physical or mental. Physical symptoms include:  Nausea and vomiting.  Muscle aches or spasms.  Watery eyes and runny nose.  Widening of the dark centers of the eyes (dilated pupils).  Hair standing on end.  Fever and sweating.  Intestinal cramping and diarrhea.  Increased blood pressure and fast pulse. Mental symptoms include:  Depression.  Anxiety.  Restlessness and irritability.  Trouble sleeping. When symptoms start and how long they last depends on if you have been taking an opioid that works fast and then loses its effect quickly (short acting-opioid), an opioid that works for a longer period of time (long-acting opioid), or a drug that blocks the effects of opioids.  If you have been taking a  short-acting opioid, such as heroin and oxycodone, symptoms occur within hours of stopping or reducing the amount you take. The worst symptoms (peak withdrawal) occur in 24-48 hours. Symptoms should subside in 3-5 days.  If you have been taking a long-acting opioid, such as methadone, symptoms can occur within 30 hours of stopping or reducing the amount you take and can continue for up to 10 days.  If you are taking a drug that blocks the effects of opioids, such as naltrexone or naloxone, symptoms begin within minutes. How is this diagnosed? This condition is diagnosed based on:  Your symptoms.  Your medical history.  Your history of drug and alcohol use.  Which medicines you have been taking. Your health care provider may:  Perform a physical exam.  Order tests.  Ask that you see a mental health professional. How is this treated? Treatment for this condition is usually provided by mental health professionals with training in substance use disorders (addiction specialists). Treatment may involve:  Counseling. This treatment is also called talk therapy. It is provided by substance use treatment counselors.  Support groups. Support groups are run by people who have quit using opioids. They provide emotional support, advice, and guidance.  Medicine. Some medicines can help to lessen certain withdrawal symptoms. Sometimes an opioid is prescribed to replace the opioid that you have been taking. You may be asked to take less and less of this opioid over time to lessen or prevent withdrawal symptoms. Follow these instructions at home:  Take over-the-counter and prescription medicines only as told by your health  care provider.  Check with your health care provider before starting any new medicines.  Keep all follow-up visits as told by your health care provider. This is important. Contact a health care provider if:  You are not able to take your medicines as told.  Your symptoms get  worse.  You take an opioid after stopping use, or you take more of an opioid than you have been. Get help right away if:  You have a seizure.  You lose consciousness.  You have serious thoughts about hurting yourself or others. If you ever feel like you may hurt yourself or others, or have thoughts about taking your own life, get help right away. You can go to your nearest emergency department or call:  Your local emergency services (911 in the [place]  A suicide crisis helpline, such as the National Suicide Prevention Lifeline at (601) 119-00881-626 106 2175. This is open 24 hours a day. This information is not intended to replace advice given to you by your health care provider. Make sure you discuss any questions you have with your health care provider. Document Released: 06/29/2003 Document Revised: 06/08/2017 Document Reviewed: 04/07/2016 Elsevier Patient Education  2020 [employer]

## 2019-03-04 NOTE — Progress Notes (Signed)
   CC: Opioid Use Disorder  Tonya David presents for follow up of opioid use disorder I have reviewed the prior induction visit, follow up visits, and telephone encounters relevant to opiate use disorder (OUD) treatment.  Current daily dose: Suboxone 20-6mg  daily  Date of Induction: 08/14/18  Current follow up interval, in weeks: 4  The patient has been adherent with the buprenorphine for OUD contract.  Last UDS Result: Buprenorphine + metabolite + amphetamine  HPI: [name] is a 33 y.o. F presents for continued OBAT. She states that since her last visit, she has been doing well. She mostly stays home due to the pandemic and is accompanied by her 7 children who 'drives her crazy'. She expresses wanting to be weaned off suboxone completely and currently having no significant withdrawal symptoms on Suboxone 20-6 mg daily dose. She denies any significant cravings and denies any episodes of relapse since her last visit. No other complaints.  Past Medical History:  Diagnosis Date  . Anemia   . Headache(784.0)   . Preterm labor   . Urinary tract infection     Review of Systems: Review of Systems  Constitutional: Negative for chills, fever and malaise/fatigue.  Respiratory: Negative for shortness of breath.   Cardiovascular: Negative for chest pain.  Gastrointestinal: Negative for constipation, diarrhea, nausea and vomiting.  Neurological: Negative for tingling and tremors.  Psychiatric/Behavioral: The patient is not nervous/anxious.     Physical Exam: Vitals:   03/04/19 1021  BP: 116/61  Pulse: 96  Temp: 98.4 F (36.9 C)  TempSrc: Oral  SpO2: 100%  Weight: 134 lb 3.2 oz (60.9 kg)  Height: 5\' 6"  (1.676 m)   Physical Exam  Constitutional: She is oriented to person, place, and time. She appears well-developed and well-nourished. No distress.  HENT:  Mouth/Throat: Oropharynx is clear and moist.  Eyes: Pupils are equal, round, and reactive to light.  Conjunctivae and EOM are normal.  Cardiovascular: Normal rate, regular rhythm, normal heart sounds and intact distal pulses.  No murmur heard. Respiratory: Effort normal and breath sounds normal.  GI: Soft. Bowel sounds are normal. There is no abdominal tenderness. There is no guarding.  Musculoskeletal: Normal range of motion.        General: No edema.  Neurological: She is alert and oriented to person, place, and time.  No tremors  Skin: Skin is warm.  Psychiatric: She has a normal mood and affect.  Having difficulty sitting still    Assessment & Plan:   Opioid use disorder, moderate, in early remission, dependence ([place]) Continuing to do well on Suboxone 20-6mg  daily. Denies any significant withdrawal symptoms. Denies any cravings or relapse episodes. On exam today COWS of 4 with difficulty sitting still but no other withdrawal symptoms. Discussed plan to continue titrating down dose as she wishes to be completely off Suboxone.  - Decrease dose for Suboxone 8mg  film BID - Urine screen today - F/u in 4 weeks    Patient seen with Dr. Daryll Drown   -Gilberto Better, PGY2 [facility] Pager: [phone]

## 2019-03-04 NOTE — Assessment & Plan Note (Signed)
Continuing to do well on Suboxone 20-6mg  daily. Denies any significant withdrawal symptoms. Denies any cravings or relapse episodes. On exam today COWS of 4 with difficulty sitting still but no other withdrawal symptoms. Discussed plan to continue titrating down dose as she wishes to be completely off Suboxone.  - Decrease dose for Suboxone 8mg  film BID - Urine screen today - F/u in 4 weeks

## 2019-03-05 ENCOUNTER — Telehealth: Payer: Self-pay | Admitting: *Deleted

## 2019-03-05 MED ORDER — BUPRENORPHINE HCL-NALOXONE HCL 8-2 MG SL FILM: 1.0000 | ORAL_FILM | Freq: Two times a day (BID) | SUBLINGUAL | 0 refills | Status: DC

## 2019-03-05 NOTE — Telephone Encounter (Signed)
Received [facility] from [facility] regarding [facility] sent yesterday. There are 2 sets of directions in Sig. Please resend Rx with one set of directions. Hubbard Hartshorn, RN, BSN

## 2019-03-05 NOTE — Addendum Note (Signed)
Addended by: Gilles Chiquito B on: 03/05/2019 02:12 PM   Modules accepted: Level of Service

## 2019-03-05 NOTE — Progress Notes (Signed)
Internal Medicine Clinic Attending  I saw and evaluated the patient.  I personally confirmed the key portions of the history and exam documented by Dr. Lee and I reviewed pertinent patient test results.  The assessment, diagnosis, and plan were formulated together and I agree with the documentation in the resident's note.  

## 2019-03-06 LAB — TOXASSURE SELECT,+ANTIDEPR,UR

## 2019-03-26 ENCOUNTER — Encounter: Payer: Self-pay | Admitting: Internal Medicine

## 2019-03-26 ENCOUNTER — Other Ambulatory Visit: Payer: Self-pay | Admitting: Internal Medicine

## 2019-03-27 MED ORDER — BUPRENORPHINE HCL-NALOXONE HCL 8-2 MG SL FILM: 1.0000 | ORAL_FILM | Freq: Two times a day (BID) | SUBLINGUAL | 0 refills | Status: DC

## 2019-03-27 NOTE — Telephone Encounter (Signed)
I will approve an early refill of suboxone, can be filled today, and [facility]. It would be best to fill this medicine here and take with her to [place]. I am not sure the regulations of prescribing controlled meds across state lines right now.

## 2019-03-30 ENCOUNTER — Telehealth: Payer: Self-pay | Admitting: Student in an Organized Health Care Education/Training Program

## 2019-03-30 ENCOUNTER — Encounter: Payer: Self-pay | Admitting: Student in an Organized Health Care Education/Training Program

## 2019-03-30 ENCOUNTER — Encounter: Payer: Self-pay | Admitting: Internal Medicine

## 2019-03-31 ENCOUNTER — Encounter: Payer: Self-pay | Admitting: Student in an Organized Health Care Education/Training Program

## 2019-04-01 ENCOUNTER — Encounter: Payer: Self-pay | Admitting: Student in an Organized Health Care Education/Training Program

## 2019-04-01 MED ORDER — BUPRENORPHINE HCL-NALOXONE HCL 8-2 MG SL FILM: ORAL_FILM | SUBLINGUAL | 0 refills | Status: DC

## 2019-04-01 NOTE — Telephone Encounter (Signed)
Requesting to speak with a nurse about meds. Please call pt back.  

## 2019-05-07 ENCOUNTER — Encounter: Payer: Self-pay | Admitting: Student in an Organized Health Care Education/Training Program

## 2019-05-07 ENCOUNTER — Other Ambulatory Visit: Payer: Self-pay | Admitting: Student in an Organized Health Care Education/Training Program

## 2019-05-08 MED ORDER — BUPRENORPHINE HCL-NALOXONE HCL 8-2 MG SL FILM: ORAL_FILM | SUBLINGUAL | 0 refills | Status: DC

## 2019-05-08 NOTE — Telephone Encounter (Signed)
Refill is ok. Please make an in-person OUD appointment for one month.

## 2019-05-18 ENCOUNTER — Other Ambulatory Visit: Payer: Self-pay | Admitting: Student in an Organized Health Care Education/Training Program

## 2019-05-19 ENCOUNTER — Other Ambulatory Visit: Payer: Self-pay | Admitting: Student in an Organized Health Care Education/Training Program

## 2019-05-19 MED ORDER — BUPRENORPHINE HCL-NALOXONE HCL 8-2 MG SL FILM: ORAL_FILM | SUBLINGUAL | 0 refills | Status: DC

## 2019-05-20 ENCOUNTER — Telehealth: Payer: Self-pay | Admitting: Internal Medicine

## 2019-05-20 NOTE — Telephone Encounter (Signed)
Tonya David missed her appointment at [place] clinic today. I called her few times to see how she is doing. No response.

## 2019-05-29 ENCOUNTER — Encounter: Payer: Self-pay | Admitting: Student in an Organized Health Care Education/Training Program

## 2019-05-29 ENCOUNTER — Other Ambulatory Visit: Payer: Self-pay | Admitting: Internal Medicine

## 2019-05-29 NOTE — Telephone Encounter (Signed)
Scheduled pt for 12/1 at [place]

## 2019-05-29 NOTE — Telephone Encounter (Signed)
Refilled.  Please let patient know through [employer].   Thanks

## 2019-06-10 ENCOUNTER — Encounter: Payer: Self-pay | Admitting: General Practice

## 2019-06-11 ENCOUNTER — Encounter: Payer: Self-pay | Admitting: Student in an Organized Health Care Education/Training Program

## 2019-06-11 ENCOUNTER — Other Ambulatory Visit: Payer: Self-pay | Admitting: *Deleted

## 2019-06-12 MED ORDER — BUPRENORPHINE HCL-NALOXONE HCL 8-2 MG SL FILM: ORAL_FILM | SUBLINGUAL | 0 refills | Status: DC

## 2019-06-24 ENCOUNTER — Other Ambulatory Visit: Payer: Self-pay

## 2019-06-24 ENCOUNTER — Ambulatory Visit (INDEPENDENT_AMBULATORY_CARE_PROVIDER_SITE_OTHER): Payer: Medicaid Other | Admitting: Internal Medicine

## 2019-06-24 VITALS — BP 128/83 | HR 102 | Temp 98.1°F | Ht 66.0 in | Wt 131.4 lb

## 2019-06-24 DIAGNOSIS — F1120 Opioid dependence, uncomplicated: Secondary | ICD-10-CM | POA: Diagnosis not present

## 2019-06-24 DIAGNOSIS — F1121 Opioid dependence, in remission: Secondary | ICD-10-CM

## 2019-06-24 MED ORDER — BUPRENORPHINE HCL-NALOXONE HCL 8-2 MG SL FILM: ORAL_FILM | SUBLINGUAL | 0 refills | Status: DC

## 2019-06-24 NOTE — Assessment & Plan Note (Signed)
Reviewed [place] Database however, no prescriptions are showing number possibly some sort of issue with the reporting. Otherwise she appears to be doing very well in remission for her OUD. I will continue her on Suboxone 8-2 mg films total of 2-1/2 films per day in divided doses.  Discussed that she may decrease to 2 films and let us know how this goes if life events are less stressful going forward. Repeated U tox today. Plan for in person visit in 2 months however if you tox not completely appropriate we will add a telehealth visit in 1 month to discuss.

## 2019-06-24 NOTE — Progress Notes (Signed)
   06/24/2019  Tonya David presents for follow up of opioid use disorder I have reviewed the prior induction visit, follow up visits, and telephone encounters relevant to opiate use disorder (OUD) treatment.   Current daily dose: Suboxone 20mg  daily (2.5 films daily)  Date of Induction: 08/14/2018 with the [facility] OUD clinic   Current follow up interval, in weeks: 4 weeks (frequently tele visits due to COVID-19)  The patient has been adherent with the buprenorphine for OUD contract.    Last UDS Result: 03/04/19 Expected buprenorphine but with amphetamine.   HPI: Tonya David is a 33 year old female who presents today for evaluation of chronic opioid use disorder.  She reports that she has been doing very well with 2 and half films of Suboxone a day.  When she takes this at night her cravings are well controlled.  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  Despite this she reports no relapses.  She is previously occasionally had an ketamine in her U tox this.  She reports this is due to use of Adderall as a bridge when she runs out of Suboxone.  She denies any recent Adderall use.  Exam:   Vitals:   06/24/19 0848  BP: 128/83  Pulse: (!) 102  Temp: 98.1 F (36.7 C)  TempSrc: Oral  SpO2: 99%  Weight: 131 lb 6.4 oz (59.6 kg)  Height: 5\' 6"  (1.676 m)   General: NAD, calm Cardio: RRR, no mrg's Pulmonary: CTA bilaterally MSK: BLE nontender, nonedematous Psych: Appropriate affect, not depressed in appearance, engages well  Assessment/Plan:  See Problem Based Charting in the Encounters Tab   Lucious Groves, DO  06/24/2019  8:46 AM

## 2019-06-27 LAB — TOXASSURE SELECT,+ANTIDEPR,UR

## 2019-07-17 ENCOUNTER — Other Ambulatory Visit: Payer: Self-pay | Admitting: Internal Medicine

## 2019-07-17 MED ORDER — BUPRENORPHINE HCL-NALOXONE HCL 8-2 MG SL FILM: ORAL_FILM | SUBLINGUAL | 0 refills | Status: DC

## 2019-07-17 NOTE — Telephone Encounter (Signed)
Reviewed last suboxone clinic note. Patient was doing well at that time with plans for 2 month follow up if UDS appropriate. UDS reviewed, appropriate for buprenorphine and metabolites, no unexpected findings. Database reviewed, patient last filled on 12/16 for a 30 day supply. Attempted to call patient to ask about early refill request, no answer, unable to leave VM. Will send 1 month refill with appropriate fill date. Please have patient schedule 1 month follow up.

## 2019-07-25 ENCOUNTER — Other Ambulatory Visit: Payer: Self-pay | Admitting: *Deleted

## 2019-07-25 MED ORDER — BUPRENORPHINE HCL-NALOXONE HCL 8-2 MG SL FILM: ORAL_FILM | SUBLINGUAL | 0 refills | Status: DC

## 2019-07-30 ENCOUNTER — Ambulatory Visit
Admission: RE | Admit: 2019-07-30 | Discharge: 2019-07-31 | Disposition: A | Discharge status: Home / Self Care | Payer: Medicaid Other | Source: Ambulatory Visit

## 2019-07-30 ENCOUNTER — Inpatient Hospital Stay: Admit: 2019-07-30 | Payer: Medicaid Other

## 2019-08-17 ENCOUNTER — Other Ambulatory Visit: Payer: Self-pay | Admitting: Student in an Organized Health Care Education/Training Program

## 2019-08-17 DIAGNOSIS — F1121 Opioid dependence, in remission: Secondary | ICD-10-CM

## 2019-08-18 NOTE — Telephone Encounter (Signed)
Please review last UDS for appt making

## 2019-08-19 MED ORDER — BUPRENORPHINE HCL-NALOXONE HCL 8-2 MG SL FILM: ORAL_FILM | SUBLINGUAL | 0 refills | Status: DC

## 2019-08-19 NOTE — Telephone Encounter (Signed)
Patient needs telehealth appointment. Please schedule her for next week. I have approved a 1 week refill.

## 2019-08-24 ENCOUNTER — Other Ambulatory Visit: Payer: Self-pay | Admitting: Student in an Organized Health Care Education/Training Program

## 2019-08-24 DIAGNOSIS — F1121 Opioid dependence, in remission: Secondary | ICD-10-CM

## 2019-08-25 NOTE — Telephone Encounter (Signed)
This is her third refill request without scheduling appropriate follow up. One month refill sent on 1/7 with request for patient to follow up, then additional 1 week refill sent on 2/9. Please have patient schedule telehealth follow up for tomorrow if possible. Otherwise looks like clinic is booked until March 9th. I have sent an additional 1 week refill.

## 2019-08-26 ENCOUNTER — Telehealth: Payer: Self-pay | Admitting: *Deleted

## 2019-08-28 ENCOUNTER — Encounter: Payer: Self-pay | Admitting: Student in an Organized Health Care Education/Training Program

## 2019-08-30 ENCOUNTER — Other Ambulatory Visit: Payer: Self-pay | Admitting: Internal Medicine

## 2019-08-30 ENCOUNTER — Encounter: Payer: Self-pay | Admitting: Student in an Organized Health Care Education/Training Program

## 2019-08-30 ENCOUNTER — Other Ambulatory Visit: Payer: Self-pay | Admitting: Student in an Organized Health Care Education/Training Program

## 2019-08-30 DIAGNOSIS — F1121 Opioid dependence, in remission: Secondary | ICD-10-CM

## 2019-08-31 ENCOUNTER — Other Ambulatory Visit: Payer: Self-pay | Admitting: Student in an Organized Health Care Education/Training Program

## 2019-08-31 DIAGNOSIS — F1121 Opioid dependence, in remission: Secondary | ICD-10-CM

## 2019-09-01 ENCOUNTER — Encounter: Payer: Self-pay | Admitting: Student in an Organized Health Care Education/Training Program

## 2019-09-01 ENCOUNTER — Other Ambulatory Visit: Payer: Self-pay | Admitting: Student in an Organized Health Care Education/Training Program

## 2019-09-01 DIAGNOSIS — F1121 Opioid dependence, in remission: Secondary | ICD-10-CM

## 2019-09-01 NOTE — Telephone Encounter (Signed)
Refill Request  Buprenorphine HCl-Naloxone HCl (SUBOXONE) 8-2 MG   : [id number] [place],  - [address]

## 2019-09-01 NOTE — Telephone Encounter (Signed)
Can we please try and get in touch with this patient to have her schedule follow up before I approve another refill? This is her 4th refill request without scheduling. Thanks!

## 2019-09-01 NOTE — Telephone Encounter (Signed)
Patient has follow up scheduled 3/2. Approved 2 week refill. Database reviewed.

## 2019-09-04 NOTE — Telephone Encounter (Signed)
Review meds/ visits 

## 2019-09-09 ENCOUNTER — Ambulatory Visit (INDEPENDENT_AMBULATORY_CARE_PROVIDER_SITE_OTHER): Payer: Medicaid Other | Admitting: Internal Medicine

## 2019-09-09 ENCOUNTER — Other Ambulatory Visit: Payer: Self-pay

## 2019-09-09 VITALS — BP 118/67 | HR 115 | Temp 98.3°F | Ht 66.0 in | Wt 133.8 lb

## 2019-09-09 DIAGNOSIS — F1121 Opioid dependence, in remission: Secondary | ICD-10-CM

## 2019-09-09 DIAGNOSIS — F1120 Opioid dependence, uncomplicated: Secondary | ICD-10-CM

## 2019-09-09 MED ORDER — BUPRENORPHINE HCL-NALOXONE HCL 8-2 MG SL FILM: 1.0000 | ORAL_FILM | Freq: Three times a day (TID) | SUBLINGUAL | 0 refills | Status: DC

## 2019-09-09 NOTE — Patient Instructions (Signed)
[name],  It was a pleasure to see you today. Thank you for coming in.   Today we discussed your opiate use and Suboxone prescription. You are doing great with this medication!  Please keep up the good work!  Please schedule a tele-health appointment in 1 month and an in person appointment in 2 months.   Thank you again for coming in.   Claudean Severance.D.

## 2019-09-09 NOTE — Assessment & Plan Note (Signed)
Reviewed [place] database, only receiving Suboxone prescription from [facility] OUD clinic. She reported running out of prescription about 3-4 days before her prescription because of taking 3 films per day instead of the 2.5 films per day. She has not been having cravings, any withdrawal symptoms, or relapses. She seems to be doing well at this dose. Will refill the Suboxone for TID dosing to avoid her running out of the medications and limit the risk of withdrawal. Discussed that when she starts having less stress then we can continue tapering her off the medication.  -Repeat U-tox today.  -Increase Suboxone to 8-2 mg TID -F/u in 1 month (tele health)

## 2019-09-09 NOTE — Progress Notes (Signed)
Internal Medicine Clinic Attending  I saw and evaluated the patient.  I personally confirmed the key portions of the history and exam documented by Dr. Krienke and I reviewed pertinent patient test results.  The assessment, diagnosis, and plan were formulated together and I agree with the documentation in the resident's note.    

## 2019-09-09 NOTE — Progress Notes (Signed)
   09/09/2019  Tonya David presents for follow up of opioid use disorder I have reviewed the prior induction visit, follow up visits, and telephone encounters relevant to opiate use disorder (OUD) treatment.   Current daily dose: Patient is taking Suboxone 20-24 mg per day (2.5-3 tabs per day)  Date of Induction: 08/14/2018 with the [facility] OUD clinic  Current follow up interval, in weeks: 4 weeks  The patient has been adherent with the buprenorphine for OUD contract.   Last UDS Result: 06/24/2019: Positive for buprenorphine, norbuprenorphine, and amphetamine.   HPI: This is a 34 year old female who presents today for follow up of chronic opioid use disorder. She reports that she is doing okay, she has been having some stress at home, she has 7 children at home, aged 59-65 years old. 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 She denies any relapse, withdrawal symptoms, or recent cravings. She does report taking up to 3 films of bupronorphine per day, never takes more then this, reports doing this . She does report running out 3-4 days before her next refill. She reports she takes it on days where there is more stress. She is still interested in trying to cut down on the Suboxone. She denies any other substance use.   Exam:   Vitals:   09/09/19 0857  BP: 118/67  Pulse: (!) 115  Temp: 98.3 F (36.8 C)  TempSrc: Oral  SpO2: 100%  Weight: 133 lb 12.8 oz (60.7 kg)  Height: 5\' 6"  (1.676 m)    General: Young female, NAD, sitting up in chair Cardiac: Tachycardic, regular rhythm, no m/r/g Pulmonary: CTABL, no wheezing, rhonchi, rales Psych: Calm, appropriate mood and affect  Assessment/Plan:  See Problem Based Charting in the Encounters Tab  , MD  09/09/2019  11:24 AM

## 2019-09-13 LAB — TOXASSURE SELECT,+ANTIDEPR,UR

## 2019-10-10 ENCOUNTER — Other Ambulatory Visit: Payer: Self-pay | Admitting: Internal Medicine

## 2019-10-10 ENCOUNTER — Encounter: Payer: Self-pay | Admitting: Student in an Organized Health Care Education/Training Program

## 2019-10-10 DIAGNOSIS — F1121 Opioid dependence, in remission: Secondary | ICD-10-CM

## 2019-10-13 ENCOUNTER — Other Ambulatory Visit: Payer: Self-pay | Admitting: Internal Medicine

## 2019-10-13 DIAGNOSIS — F1121 Opioid dependence, in remission: Secondary | ICD-10-CM

## 2019-10-13 NOTE — Telephone Encounter (Signed)
Last OV 09/09/2019 with 4 week return request. Call placed to patient to schedule OUD appt for tomorrow. No answer. Left message on VM requesting return call. Kinnie Feil, RN, BSN

## 2019-10-14 ENCOUNTER — Other Ambulatory Visit: Payer: Self-pay

## 2019-10-14 ENCOUNTER — Ambulatory Visit (INDEPENDENT_AMBULATORY_CARE_PROVIDER_SITE_OTHER): Payer: Medicaid Other | Admitting: Internal Medicine

## 2019-10-14 DIAGNOSIS — F1121 Opioid dependence, in remission: Secondary | ICD-10-CM | POA: Diagnosis not present

## 2019-10-14 MED ORDER — BUPRENORPHINE HCL-NALOXONE HCL 8-2 MG SL FILM: 1.0000 | ORAL_FILM | Freq: Three times a day (TID) | SUBLINGUAL | 0 refills | Status: DC

## 2019-10-14 NOTE — Assessment & Plan Note (Signed)
Tonya David continues to do well. No cravings, no relapse, no withdrawal symptoms.  Last UDS reviewed was appropriate, but with amphetamine.  She has reported before that she uses adderall on occasion.  Will need to discuss possible cessation of this substance in future.  PDMP was reviewed and appropriate.   Plan Continue Suboxone at current dose.  Encouraged weaning down on the medication after tax season is over UDS at next in person visit.

## 2019-10-14 NOTE — Progress Notes (Signed)
  [facility] Residency Telephone Encounter Continuity Care Appointment  HPI:   This telephone encounter was created for [name] on 10/14/2019 for the following purpose/cc follow up of OUD.  Tonya David reports that she is doing very well.  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  She has not been able to wean down to a lower dose than TID dosing, but feels that at this dose her cravings are controlled.  She has had no relapse.  She has no complaints or concerns at this time.    Past Medical History:  Past Medical History:  Diagnosis Date  . Anemia   . Headache(784.0)   . Preterm labor   . Urinary tract infection       ROS:   No cravings, no withdrawal, no relapse.    Assessment / Plan / Recommendations:   Please see A&P under problem oriented charting for assessment of the patient's acute and chronic medical conditions.   As always, pt is advised that if symptoms worsen or new symptoms arise, they should go to an urgent care facility or to to ER for further evaluation.   Consent and Medical Decision Making:   This is a telephone encounter between [name] and Debe Coder on 10/14/2019 for follow up of OUD. The visit was conducted with the patient located at home and Debe Coder at [facility]. The patient's identity was confirmed using their DOB and current address. The patient has consented to being evaluated through a telephone encounter and understands the associated risks (an examination cannot be done and the patient may need to come in for an appointment) / benefits (allows the patient to remain at home, decreasing exposure to coronavirus). I personally spent 10 minutes on medical discussion.     Follow up TH in 1 month and then in person in 2 months.

## 2019-11-17 ENCOUNTER — Other Ambulatory Visit: Payer: Self-pay | Admitting: Internal Medicine

## 2019-11-17 ENCOUNTER — Encounter: Payer: Self-pay | Admitting: Student in an Organized Health Care Education/Training Program

## 2019-11-17 DIAGNOSIS — F1121 Opioid dependence, in remission: Secondary | ICD-10-CM

## 2019-11-18 ENCOUNTER — Encounter: Payer: Self-pay | Admitting: Internal Medicine

## 2019-11-18 ENCOUNTER — Other Ambulatory Visit: Payer: Self-pay | Admitting: Internal Medicine

## 2019-11-18 DIAGNOSIS — F1121 Opioid dependence, in remission: Secondary | ICD-10-CM

## 2019-11-18 NOTE — Telephone Encounter (Signed)
Ok. I have approved a one month supply. Please schedule her for a televisit within the next one month. Please remind her that we want her to have a visit each month prior to her medicine running out.

## 2019-11-18 NOTE — Telephone Encounter (Signed)
LOV 10/14/19, UDS 09/09/19 No pending appt's. Pt was to have a return telehealth around 11/11/19. No openings on today's schedule.  Please advise if pt needs an in-person or telehealth next week in OUD. Thank you, SChaplin, RN,BSN

## 2019-11-18 NOTE — Telephone Encounter (Signed)
TC to patient, notified Suboxone RX was filled and reminded pt she needs to have a visit each month before her RX runs out, pt verbalized understanding.  Telehealth appt made for 11/25/19 @ 0915 in OUD. SChaplin, RN,BSN

## 2019-11-25 ENCOUNTER — Other Ambulatory Visit: Payer: Self-pay

## 2019-12-26 ENCOUNTER — Telehealth: Payer: Self-pay | Admitting: Internal Medicine

## 2019-12-26 DIAGNOSIS — F1121 Opioid dependence, in remission: Secondary | ICD-10-CM

## 2019-12-26 MED ORDER — BUPRENORPHINE HCL-NALOXONE HCL 8-2 MG SL FILM: 1.0000 | ORAL_FILM | Freq: Three times a day (TID) | SUBLINGUAL | 0 refills | Status: DC

## 2019-12-26 NOTE — Telephone Encounter (Signed)
PMDP appropriate, missed last tele visit, needs In person OUD prior to next fill (within next month).

## 2019-12-30 NOTE — Telephone Encounter (Signed)
Just sent patient via my chart her future in person appointment in the OUD clinic for 01/20/2020 at 9:45 am per Dr. Mikey Bussing.

## 2020-01-26 ENCOUNTER — Other Ambulatory Visit: Payer: Self-pay | Admitting: Internal Medicine

## 2020-01-26 DIAGNOSIS — F1121 Opioid dependence, in remission: Secondary | ICD-10-CM

## 2020-01-26 NOTE — Telephone Encounter (Signed)
Rx last sent 12/26/2019 Patient no showed appt 01/20/2020 Last OV 10/14/2019 by Tele Last in-person OV 09/09/2019

## 2020-01-26 NOTE — Telephone Encounter (Signed)
Would it be possible to add her on as overbook to the OUD schedule tomorrow given that she missed last week?

## 2020-01-27 ENCOUNTER — Encounter: Payer: Self-pay | Admitting: Internal Medicine

## 2020-01-27 ENCOUNTER — Other Ambulatory Visit: Payer: Self-pay | Admitting: Internal Medicine

## 2020-01-27 DIAGNOSIS — F1121 Opioid dependence, in remission: Secondary | ICD-10-CM

## 2020-01-27 MED ORDER — BUPRENORPHINE HCL-NALOXONE HCL 8-2 MG SL FILM: 1.0000 | ORAL_FILM | Freq: Three times a day (TID) | SUBLINGUAL | 0 refills | Status: DC

## 2020-01-27 NOTE — Telephone Encounter (Signed)
Late Entry:  CMA had already spoken with patient and scheduled her for 7/27 in OUD.Kingsley Spittle Cassady7/20/20211:33 PM

## 2020-02-03 ENCOUNTER — Other Ambulatory Visit: Payer: Self-pay | Admitting: Internal Medicine

## 2020-02-03 DIAGNOSIS — F1121 Opioid dependence, in remission: Secondary | ICD-10-CM

## 2020-02-04 ENCOUNTER — Other Ambulatory Visit: Payer: Self-pay | Admitting: Internal Medicine

## 2020-02-04 DIAGNOSIS — F1121 Opioid dependence, in remission: Secondary | ICD-10-CM

## 2020-02-04 MED ORDER — BUPRENORPHINE HCL-NALOXONE HCL 8-2 MG SL FILM: 1.0000 | ORAL_FILM | Freq: Three times a day (TID) | SUBLINGUAL | 0 refills | Status: DC

## 2020-02-04 NOTE — Telephone Encounter (Signed)
I will approve a 1 week supply to get her to August 3rd appointment.

## 2020-02-10 ENCOUNTER — Ambulatory Visit (INDEPENDENT_AMBULATORY_CARE_PROVIDER_SITE_OTHER): Payer: Medicaid Other | Admitting: Internal Medicine

## 2020-02-10 DIAGNOSIS — F1121 Opioid dependence, in remission: Secondary | ICD-10-CM | POA: Diagnosis not present

## 2020-02-10 MED ORDER — BUPRENORPHINE HCL-NALOXONE HCL 8-2 MG SL FILM: 1.0000 | ORAL_FILM | Freq: Three times a day (TID) | SUBLINGUAL | 0 refills | Status: DC

## 2020-02-10 NOTE — Patient Instructions (Signed)
It was nice seeing you today! Thank you for choosing [facility].    Today we talked about:   1. I am so glad to hear you are doing well with your current dose of Suboxone and you feel ready to start weaning. We talked about taking it slow. On the days you feel ready, cut the middle dose in half. So you would take a full dose in the morning, half a dose in the afternoon and a full dose at night. Let's follow up in 4 weeks to see how you are doing.

## 2020-02-10 NOTE — Progress Notes (Signed)
   02/10/2020  Tonya David presents for follow up of opioid use disorder I have reviewed the prior induction visit, follow up visits, and telephone encounters relevant to opiate use disorder (OUD) treatment.   Current daily dose: Suboxone 8-2 mg TID  Date of Induction: 08/14/2018  Current follow up interval, in weeks: 4 weeks   The patient has been adherent with the buprenorphine for OUD contract.   Last UDS Result: 09/09/2019: + Buprenorphine, + Amphetamine   HPI:  Tonya David states that she has been doing well since her last visit. She is not having any difficulty with Suboxone at this time, which she takes 3 times daily. She denies any cravings or relapses, noting that her last relapse was approximately 4 years ago. She is interested in working on weaning but understands the importance of taking it slow. 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 She denies any feelings of depression or anxiety at this time  Exam:   Vitals:   02/10/20 1019  BP: 131/82  Pulse: 77  SpO2: 99%  Weight: 140 lb 12.8 oz (63.9 kg)   Physical Exam Vitals and nursing note reviewed.  Constitutional:      General: She is not in acute distress.    Appearance: She is normal weight.  Pulmonary:     Effort: Pulmonary effort is normal. No respiratory distress.  Skin:    General: Skin is warm and dry.  Neurological:     General: No focal deficit present.     Mental Status: She is alert and oriented to person, place, and time. Mental status is at baseline.  Psychiatric:        Mood and Affect: Mood normal.        Behavior: Behavior normal.    Assessment/Plan:  See Problem Based Charting in the Encounters Tab  Verdene Lennert, MD 02/10/2020  6:34 PM

## 2020-02-10 NOTE — Assessment & Plan Note (Signed)
Tonya David is doing well today. She has no difficulty with taking her Suboxone 3 times per day. No cravings, relapse or withdrawal symptoms. She is interested in weaning off Suboxone to reduce her reliance on medication. She understands the importance of weaning slowly and close follow-up.  Assessment/plan: Stable at this time. Per chart review, she previously attempted weaning in spring of 2021. 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 Will initiate a weaning process with a slower tapering, by first having her middle dose.   -Suboxone 8-2 mg; 1 tablet in the a.m., half tablet in the afternoon, 1 tablet in the evening -4-week follow-up -UDS pending

## 2020-02-11 NOTE — Progress Notes (Signed)
Internal Medicine Clinic Attending  Case discussed with Dr. Basaraba  At the time of the visit.  We reviewed the resident's history and exam and pertinent patient test results.  I agree with the assessment, diagnosis, and plan of care documented in the resident's note.  

## 2020-02-13 LAB — TOXASSURE SELECT,+ANTIDEPR,UR

## 2020-03-07 ENCOUNTER — Other Ambulatory Visit: Payer: Self-pay | Admitting: Internal Medicine

## 2020-03-07 DIAGNOSIS — F1121 Opioid dependence, in remission: Secondary | ICD-10-CM

## 2020-03-08 NOTE — Telephone Encounter (Signed)
Can this person get a telephone visit scheduled within OUD clinic? She was supposed to follow up in 4 weeks from 8/3. In a bad habit of just requesting the medicine, and not scheduling an appropriate follow up. Let me know when she has the appointment scheduled, and I can bridge the prescription until then. Need to reiterate that refills should be done within visits, either telephone visit or inperson.

## 2020-03-08 NOTE — Telephone Encounter (Addendum)
Spoke with patient. scheduled tele appt tomorrow in OUD at 9:15. Explained that all refills need to be in a visit, either in-person or by tele. States understanding . L. Merriel Zinger, BSN, RN-BC

## 2020-03-09 ENCOUNTER — Other Ambulatory Visit: Payer: Self-pay | Admitting: Internal Medicine

## 2020-03-09 ENCOUNTER — Ambulatory Visit (INDEPENDENT_AMBULATORY_CARE_PROVIDER_SITE_OTHER): Payer: Medicaid Other | Admitting: Internal Medicine

## 2020-03-09 ENCOUNTER — Other Ambulatory Visit: Payer: Self-pay

## 2020-03-09 DIAGNOSIS — F1121 Opioid dependence, in remission: Secondary | ICD-10-CM

## 2020-03-09 MED ORDER — BUPRENORPHINE HCL-NALOXONE HCL 8-2 MG SL FILM: 1.0000 | ORAL_FILM | Freq: Three times a day (TID) | SUBLINGUAL | 0 refills | Status: DC

## 2020-03-09 NOTE — Progress Notes (Signed)
  [facility] Residency Telephone Encounter Continuity Care Appointment  HPI:   This telephone encounter was created for [name] on 03/09/2020 for the following purpose/cc follow up of OUD.  She reports she is overall doing well with her opioid use disorder.  She is taking the Suboxone films 2-1/2 films a day on regular days 3 films on high stress days.  They are busy with childcare and tax preparation but she is thankful for her Suboxone therapy which helps her maintain abstinence from other opioids.   Past Medical History:  Past Medical History:  Diagnosis Date  . Anemia   . Headache(784.0)   . Preterm labor   . Urinary tract infection      ROS:   No cravings, no withdrawal, no relapse.    Assessment / Plan / Recommendations:   Please see A&P under problem oriented charting for assessment of the patient's acute and chronic medical conditions.   As always, pt is advised that if symptoms worsen or new symptoms arise, they should go to an urgent care facility or to to ER for further evaluation.   Consent and Medical Decision Making:   This is a telephone encounter between [name] and Gust Rung on 03/09/2020 for follow up of OUD. The visit was conducted with the patient located at home and Gust Rung at [facility]. The patient's identity was confirmed using their DOB and current address. The patient has consented to being evaluated through a telephone encounter and understands the associated risks (an examination cannot be done and the patient may need to come in for an appointment) / benefits (allows the patient to remain at home, decreasing exposure to coronavirus). I personally spent 8 minutes on medical discussion.     Follow up 1 month in person

## 2020-03-09 NOTE — Assessment & Plan Note (Signed)
OUD in remission on Suboxone therapy.  I did remind her that we do want to have in person visits intermittently as of right now plan to do every other month.  We will refill her Suboxone with prescription for 1 film 3 times daily she may continue to take the lowest dose needed which will be 2.5 films some days.

## 2020-04-04 ENCOUNTER — Other Ambulatory Visit: Payer: Self-pay | Admitting: Internal Medicine

## 2020-04-04 DIAGNOSIS — F1121 Opioid dependence, in remission: Secondary | ICD-10-CM

## 2020-04-05 ENCOUNTER — Encounter: Payer: Self-pay | Admitting: Internal Medicine

## 2020-04-05 DIAGNOSIS — F1121 Opioid dependence, in remission: Secondary | ICD-10-CM

## 2020-04-05 NOTE — Telephone Encounter (Signed)
Duplicate [email or web address] request pending review by MD.  This request closed for administrative purposes.Tonya Spittle Cassady9/27/20214:10 PM

## 2020-04-06 ENCOUNTER — Other Ambulatory Visit: Payer: Self-pay | Admitting: Internal Medicine

## 2020-04-06 DIAGNOSIS — F1121 Opioid dependence, in remission: Secondary | ICD-10-CM

## 2020-04-06 MED ORDER — BUPRENORPHINE HCL-NALOXONE HCL 8-2 MG SL FILM: 1.0000 | ORAL_FILM | Freq: Three times a day (TID) | SUBLINGUAL | 0 refills | Status: DC

## 2020-05-01 ENCOUNTER — Other Ambulatory Visit: Payer: Self-pay | Admitting: Student in an Organized Health Care Education/Training Program

## 2020-05-01 DIAGNOSIS — F1121 Opioid dependence, in remission: Secondary | ICD-10-CM

## 2020-05-03 MED ORDER — BUPRENORPHINE HCL-NALOXONE HCL 8-2 MG SL FILM: 1.0000 | ORAL_FILM | Freq: Three times a day (TID) | SUBLINGUAL | 0 refills | Status: DC

## 2020-05-03 NOTE — Telephone Encounter (Signed)
Needs appointment prior to further refills.  In person preferable.

## 2020-05-04 NOTE — Telephone Encounter (Signed)
Spoke with the patient.  Appt has been sch for 05/18/2020 with the OUD Clinic.

## 2020-05-18 ENCOUNTER — Other Ambulatory Visit: Payer: Self-pay

## 2020-05-18 ENCOUNTER — Ambulatory Visit (INDEPENDENT_AMBULATORY_CARE_PROVIDER_SITE_OTHER): Payer: Medicaid Other | Admitting: Internal Medicine

## 2020-05-18 DIAGNOSIS — F1121 Opioid dependence, in remission: Secondary | ICD-10-CM | POA: Diagnosis present

## 2020-05-18 MED ORDER — BUPRENORPHINE HCL-NALOXONE HCL 8-2 MG SL FILM: 1.0000 | ORAL_FILM | Freq: Three times a day (TID) | SUBLINGUAL | 1 refills | Status: DC

## 2020-05-18 NOTE — Progress Notes (Signed)
   05/18/2020  Tonya David presents for follow up of opioid use disorder I have reviewed the prior induction visit, follow up visits, and telephone encounters relevant to opiate use disorder (OUD) treatment.   Current daily dose: Suboxone 8-2 mg TID  Date of Induction: 08/14/2018  Current follow up interval, in weeks: 4 weeks   The patient has been adherent with the buprenorphine for OUD contract.   Last UDS Result: 09/09/2019: + Buprenorphine, + Amphetamine   HPI:  Tonya David states that she has been doing well since her last visit.  She continues on Suboxone high-dose therapy with 8-2 mg 3 times daily.  She has been in remission for about 4 years and being seen by her clinic for about the past 2 years.  She has intermittently had some amphetamine in her urine however her last urine tox screen was appropriate and without amphetamine. She remains busy with child raising.  She is reluctant to titrate down Suboxone aggressively but open to possibly starting to taper.  Exam:   Vitals:   05/18/20 1044  BP: 115/63  Pulse: 83  Temp: 98.6 F (37 C)  TempSrc: Oral  SpO2: 99%  Weight: 141 lb 3.2 oz (64 kg)  Height: 5\' 6"  (1.676 m)   Gen: NAD, well groomed Card: RRR Pulm: normal effort Psych: good eye contact, appropriate affect Assessment/Plan:  See Problem Based Charting in the Encounters Tab  , DO 05/18/2020  3:09 PM

## 2020-05-18 NOTE — Assessment & Plan Note (Signed)
I have prescribed her 2 months of Suboxone 8-2 mg 1 film 3 times daily.  At this point with her stability I think we can spread out the visits little further.  I did discuss with her she may start to try to taper down the dose first going to 2-1/2 films a day if she can.  Encouraged continued good oral hygiene given long-term use of opioid.

## 2020-05-31 ENCOUNTER — Telehealth: Payer: Self-pay

## 2020-05-31 ENCOUNTER — Encounter: Payer: Self-pay | Admitting: Internal Medicine

## 2020-05-31 NOTE — Telephone Encounter (Signed)
[facility] and RN spoke with pharmacist who states the RX for Suboxone will not go through medicaid because Dr. Criselda Peaches is listed as provider with medicaid, not Dr. Mikey Bussing.  RN explained to pharmacist that [facility] is a residency/attending program and RX's might be written by different providers each clinic visit as attendings rotate through OUD clinic.  Pharmacist verbalized understanding.  States patient will need to call medicaid provider and tell them she saw Dr. Mikey Bussing in the same clinic.  Pharmacist states call must come from patient to medicaid. TC to patient, she was notified of above, states she has had to do this before.  RN informed patient to call back if she has any further problems. SChaplin, RN,BSN

## 2020-05-31 NOTE — Telephone Encounter (Signed)
Ok thank you for the update.

## 2020-05-31 NOTE — Telephone Encounter (Signed)
Pls contact pharmacy; pt having trouble getting medicine [phone]

## 2020-06-28 ENCOUNTER — Encounter: Payer: Self-pay | Admitting: Internal Medicine

## 2020-06-28 DIAGNOSIS — F1121 Opioid dependence, in remission: Secondary | ICD-10-CM

## 2020-06-28 MED ORDER — BUPRENORPHINE HCL-NALOXONE HCL 8-2 MG SL FILM: 1.0000 | ORAL_FILM | Freq: Three times a day (TID) | SUBLINGUAL | 0 refills | Status: DC

## 2020-06-28 NOTE — Telephone Encounter (Signed)
Made appt per last visit dr Mikey Bussing had ask pt to f/u appr 1/9, appt is 1/11 at 0945

## 2020-07-20 ENCOUNTER — Ambulatory Visit (INDEPENDENT_AMBULATORY_CARE_PROVIDER_SITE_OTHER): Payer: Medicaid Other | Admitting: Internal Medicine

## 2020-07-20 ENCOUNTER — Other Ambulatory Visit: Payer: Self-pay

## 2020-07-20 VITALS — BP 123/84 | HR 93 | Temp 98.4°F | Wt 139.6 lb

## 2020-07-20 DIAGNOSIS — F1121 Opioid dependence, in remission: Secondary | ICD-10-CM

## 2020-07-20 MED ORDER — BUPRENORPHINE HCL-NALOXONE HCL 8-2 MG SL FILM: 1.0000 | ORAL_FILM | Freq: Three times a day (TID) | SUBLINGUAL | 0 refills | Status: DC

## 2020-07-20 NOTE — Progress Notes (Signed)
    07/23/2020  Tonya David presents for follow up of opioid use disorder I have reviewed the prior induction visit, follow up visits, and telephone encounters relevant to opiate use disorder (OUD) treatment.   Current daily dose: Suboxone 8-2 mg films 2.5-3 times daily  Date of Induction: 08/14/2018  Current follow up interval, in weeks: 8 weeks  The patient has been adherent with the buprenorphine for OUD contract.   Last UDS Result: 02/10/2020 and was appropriate  HPI: This is a 35 y.o. old female living with OUD who presents for a follow up appointment for OUD. She admits to adherence to her medication as prescribed. She admits to cravings and highlights the following triggers: Weekday stressors particularly with raising 7 children which is well controlled when she take suboxone 3 times daily. She denies relapses since her last appointment.   Exam:   Vitals:   07/20/20 1044  BP: 123/84  Pulse: 93  Temp: 98.4 F (36.9 C)  TempSrc: Oral  SpO2: 100%  Weight: 139 lb 9.6 oz (63.3 kg)   Physical Exam Constitutional:      Appearance: Normal appearance.  HENT:     Head: Normocephalic and atraumatic.  Eyes:     Extraocular Movements: Extraocular movements intact.  Cardiovascular:     Rate and Rhythm: Normal rate.     Pulses: Normal pulses.     Heart sounds: Normal heart sounds.  Pulmonary:     Effort: Pulmonary effort is normal.     Breath sounds: Normal breath sounds.  Abdominal:     General: Bowel sounds are normal.     Palpations: Abdomen is soft.     Tenderness: There is no abdominal tenderness.  Musculoskeletal:        General: Normal range of motion.     Cervical back: Normal range of motion.     Right lower leg: No edema.     Left lower leg: No edema.  Skin:    General: Skin is warm and dry.  Neurological:     Mental Status: She is alert and oriented to person, place, and time. Mental status is at baseline.  Psychiatric:        Mood and Affect: Mood  normal.     Assessment/Plan:  See Problem Based Charting in the Encounters Tab   Chari Manning, D.O.  Internal Medicine Resident, PGY-2 [facility]  Pager: [phone] 7:02 AM, 07/23/2020

## 2020-07-20 NOTE — Patient Instructions (Signed)
Thank you, [name] for allowing [place] to provide your care today. Today we discussed opioid use disorder.    I have ordered the following labs for you:   Lab Orders     ToxAssure Select,+Antidepr,UR   Tests ordered today:  None  Referrals ordered today:   Referral Orders  No referral(s) requested today     I have ordered the following medication/changed the following medications:   Stop the following medications: Medications Discontinued During This Encounter  Medication Reason  . Buprenorphine HCl-Naloxone HCl (SUBOXONE) 8-2 MG FILM Reorder     Start the following medications: Meds ordered this encounter  Medications  . Buprenorphine HCl-Naloxone HCl (SUBOXONE) 8-2 MG FILM    Sig: Place 1 Film under the tongue in the morning, at noon, and at bedtime. PLACE 1 FILM UNDER THE TONGUE 3 (THREE) TIMES DAILY.    Dispense:  90 each    Refill:  0    NADEAN:[id number] Please fill 30 days after last Rx     Follow up: 2 months    Should you have any questions or concerns please call the internal medicine clinic at [phone].     Dellia Cloud, D.O. [facility]

## 2020-07-23 ENCOUNTER — Encounter: Payer: Self-pay | Admitting: Internal Medicine

## 2020-07-23 NOTE — Addendum Note (Signed)
Addended by: Chari Manning on: 07/23/2020 07:35 AM   Modules accepted: Level of Service

## 2020-07-23 NOTE — Addendum Note (Signed)
Addended by: Chari Manning on: 07/23/2020 07:36 AM   Modules accepted: Level of Service

## 2020-07-23 NOTE — Assessment & Plan Note (Signed)
Patient presents for a follow-up appointment for opioid use disorder.  Her last appointment was on 11/09 and is currently on 50-month interval follow-ups.  Her last U tox was on 08/03 and was appropriate and her last refill was given on 06/28/20.  During her last appointment patient was counseled on trying to titrate down her Suboxone dose to 2.5 films daily.  On evaluation today, patient states that she has been doing well since her last appointment.  She states that during the weeks she is unable to titrate down to the 2.5 films daily dosing.  She highlights the daily stressors of dealing with raising 7 children as a reason for taking 3 films daily during the week.  She states that if she takes only 2.5 films she will experience symptoms such as restlessness, yawning, and difficulty sleeping/anxiety.  Otherwise she denies any significant cravings on the 3 times daily dosing and has not had any relapses.  She has been well controlled since her induction most 2 years ago (08/14/2018).  Patient may benefit from anxiety treatment to help titrate off of her Suboxone.  She will need to be further evaluated for this at subsequent appointments.  Plan: -Refill Suboxone 8-2 mg 3 times daily and encouraged her to continue trying to titrate down to 2.5 films daily -U tox ordered today. -PDMP reviewed and is appropriate -We will follow-up with her in 2 months.

## 2020-07-27 LAB — TOXASSURE SELECT,+ANTIDEPR,UR

## 2020-07-28 NOTE — Progress Notes (Signed)
Internal Medicine Clinic Attending  Case discussed with Dr. Coe  At the time of the visit.  We reviewed the resident's history and exam and pertinent patient test results.  I agree with the assessment, diagnosis, and plan of care documented in the resident's note.  

## 2020-08-21 ENCOUNTER — Other Ambulatory Visit: Payer: Self-pay | Admitting: Internal Medicine

## 2020-08-21 DIAGNOSIS — F1121 Opioid dependence, in remission: Secondary | ICD-10-CM

## 2020-08-23 ENCOUNTER — Other Ambulatory Visit: Payer: Self-pay | Admitting: Internal Medicine

## 2020-08-23 ENCOUNTER — Encounter: Payer: Self-pay | Admitting: Internal Medicine

## 2020-08-23 DIAGNOSIS — F1121 Opioid dependence, in remission: Secondary | ICD-10-CM

## 2020-08-23 MED ORDER — BUPRENORPHINE HCL-NALOXONE HCL 8-2 MG SL FILM: 1.0000 | ORAL_FILM | Freq: Three times a day (TID) | SUBLINGUAL | 0 refills | Status: DC

## 2020-08-23 NOTE — Telephone Encounter (Signed)
Refill Request- Pt calling back for the following medication as she states she is now out of meds:       Buprenorphine HCl-Naloxone HCl (SUBOXONE) 8-2 MG FILM Reymundo Poll, MD]      [phone] [place], [place] - [address]

## 2020-09-19 ENCOUNTER — Encounter: Payer: Self-pay | Admitting: Internal Medicine

## 2020-09-19 ENCOUNTER — Other Ambulatory Visit: Payer: Self-pay | Admitting: Internal Medicine

## 2020-09-19 DIAGNOSIS — F1121 Opioid dependence, in remission: Secondary | ICD-10-CM

## 2020-09-20 ENCOUNTER — Encounter: Payer: Self-pay | Admitting: Internal Medicine

## 2020-09-20 MED ORDER — BUPRENORPHINE HCL-NALOXONE HCL 8-2 MG SL FILM: 1.0000 | ORAL_FILM | Freq: Three times a day (TID) | SUBLINGUAL | 0 refills | Status: DC

## 2020-09-20 NOTE — Telephone Encounter (Signed)
Refill request sent in another encounter-pending review.Criss Alvine, Nishaan Stanke Cassady3/14/20229:25 AM

## 2020-09-20 NOTE — Telephone Encounter (Signed)
Approved 1 month refill of suboxone. Please have her schedule OUD telehealth follow up next available. Thanks!

## 2020-10-12 ENCOUNTER — Ambulatory Visit: Payer: Medicaid Other | Admitting: Internal Medicine

## 2020-10-12 ENCOUNTER — Other Ambulatory Visit: Payer: Self-pay

## 2020-10-12 DIAGNOSIS — F1121 Opioid dependence, in remission: Secondary | ICD-10-CM

## 2020-10-12 NOTE — Progress Notes (Signed)
Attempted to call patient x3. Unable to reach at this time. HIPAA compliant voicemail left.

## 2020-10-19 ENCOUNTER — Other Ambulatory Visit: Payer: Self-pay | Admitting: Internal Medicine

## 2020-10-19 DIAGNOSIS — F1121 Opioid dependence, in remission: Secondary | ICD-10-CM

## 2020-10-20 ENCOUNTER — Other Ambulatory Visit: Payer: Self-pay | Admitting: Internal Medicine

## 2020-10-20 DIAGNOSIS — F1121 Opioid dependence, in remission: Secondary | ICD-10-CM

## 2020-10-20 NOTE — Telephone Encounter (Signed)
Next appt scheduled 10/21/20 with Dr Nedra Hai.

## 2020-10-21 ENCOUNTER — Ambulatory Visit: Payer: Medicaid Other | Admitting: Internal Medicine

## 2020-10-21 ENCOUNTER — Other Ambulatory Visit: Payer: Self-pay

## 2020-10-21 ENCOUNTER — Encounter: Payer: Self-pay | Admitting: Internal Medicine

## 2020-10-21 VITALS — BP 130/82 | HR 111 | Temp 99.4°F | Ht 66.0 in | Wt 135.5 lb

## 2020-10-21 DIAGNOSIS — F1121 Opioid dependence, in remission: Secondary | ICD-10-CM

## 2020-10-21 NOTE — Progress Notes (Signed)
   CC: Opioid use disorder  Tonya David presents for follow up of opioid use disorder I have reviewed the prior induction visit, follow up visits, and telephone encounters relevant to opiate use disorder (OUD) treatment.  Current daily dose: Suboxone 8-2mg  1 film 3 times daily  Date of Induction: 08/14/2018  Current follow up interval, in weeks: 8  The patient has been adherent with the buprenorphine for OUD contract.  Last UDS Result:07/20/2020 + Buprenorphine, +norbuprenorphine  HPI: TonyaDavid presents for f/u management of opioid use disorder. She mentions going on vacation recently and running out of suboxone after missing previously schedule appointment with [facility]. States she experienced some withdrawal sxs with fatigue, malaise, pain. Self-medicated with Adderall which she mentions improves these sxs. Denies cravings or relapse. She was able to pick up her suboxone sent by Dr.Guilloud yesterday and mentions last use this am. Mentions willingness to c/w Suboxone therapy and plan to wean off Suboxone eventually although not ready to titrate down dose this visit.  Past Medical History:  Diagnosis Date  . Anemia   . Headache(784.0)   . Preterm labor   . Urinary tract infection    Review of Systems: Review of Systems  Constitutional: Negative for chills, fever and malaise/fatigue.  Respiratory: Negative for shortness of breath.   Cardiovascular: Negative for chest pain and palpitations.  Gastrointestinal: Negative for constipation, diarrhea, nausea and vomiting.  All other systems reviewed and are negative.    Physical Exam: Vitals:   10/21/20 1440  BP: 130/82  Pulse: (!) 111  Temp: 99.4 F (37.4 C)  TempSrc: Oral  SpO2: 100%  Weight: 135 lb 8 oz (61.5 kg)  Height: 5\' 6"  (1.676 m)   Gen: Well-developed, well nourished, NAD HEENT: NCAT head, hearing intact CV: RRR, S1, S2 normal Pulm: CTAB, No rales, no wheezes Extm: ROM intact, Peripheral pulses intact, No  peripheral edema Skin: Dry, Warm, normal turgor, no wounds, no rashes, no lesions  Assessment & Plan:   Opioid use disorder, moderate, in sustained remission (HCC) Presents for continued management of opioid use disorder. Currently on 2 month interval follow up. Mentions delay in receiving tx due to missing in-person appointment last week. Self-treated withdrawal sxs with Adderall  A/P In remission. Discussed avoiding non-prescribed meds. Prior urine drug screen does show positive findings of amphetamines as well as suboxone although most recent UDS was appropriate.  - Repeat UDS (expect amphetamine +) - C/w suboxone 8-2mg  TID - C/w encourage weaning down dose - F/u in 8 weeks    Patient discussed with Dr.  -Oswaldo Done, PGY3 [facility] Pager: [phone]

## 2020-10-21 NOTE — Patient Instructions (Signed)
Thank you for allowing [place] to provide your care today. Today we discussed your Suboxone.  I have ordered urine screen labs for you. I will call if any are abnormal.    Today we made no changes to your medications.    Please follow-up in 8 weeks.    Should you have any questions or concerns please call the internal medicine clinic at [phone].

## 2020-10-21 NOTE — Assessment & Plan Note (Signed)
Presents for continued management of opioid use disorder. Currently on 2 month interval follow up. Mentions delay in receiving tx due to missing in-person appointment last week. Self-treated withdrawal sxs with Adderall  A/P In remission. Discussed avoiding non-prescribed meds. Prior urine drug screen does show positive findings of amphetamines as well as suboxone although most recent UDS was appropriate.  - Repeat UDS (expect amphetamine +) - C/w suboxone 8-2mg  TID - C/w encourage weaning down dose - F/u in 8 weeks

## 2020-10-22 NOTE — Progress Notes (Signed)
Internal Medicine Clinic Attending  Case discussed with Dr. Lee  At the time of the visit.  We reviewed the resident's history and exam and pertinent patient test results.  I agree with the assessment, diagnosis, and plan of care documented in the resident's note.    

## 2020-10-27 LAB — TOXASSURE SELECT,+ANTIDEPR,UR

## 2020-11-15 ENCOUNTER — Other Ambulatory Visit: Payer: Self-pay | Admitting: Internal Medicine

## 2020-11-15 DIAGNOSIS — F1121 Opioid dependence, in remission: Secondary | ICD-10-CM

## 2020-11-16 MED ORDER — BUPRENORPHINE HCL-NALOXONE HCL 8-2 MG SL FILM: 1.0000 | ORAL_FILM | Freq: Three times a day (TID) | SUBLINGUAL | 0 refills | Status: DC

## 2020-12-13 ENCOUNTER — Other Ambulatory Visit: Payer: Self-pay | Admitting: Student in an Organized Health Care Education/Training Program

## 2020-12-13 DIAGNOSIS — F1121 Opioid dependence, in remission: Secondary | ICD-10-CM

## 2020-12-14 ENCOUNTER — Encounter: Payer: Self-pay | Admitting: Internal Medicine

## 2020-12-14 ENCOUNTER — Other Ambulatory Visit: Payer: Self-pay | Admitting: Student in an Organized Health Care Education/Training Program

## 2020-12-14 DIAGNOSIS — F1121 Opioid dependence, in remission: Secondary | ICD-10-CM

## 2020-12-14 MED ORDER — BUPRENORPHINE HCL-NALOXONE HCL 8-2 MG SL FILM: 1.0000 | ORAL_FILM | Freq: Three times a day (TID) | SUBLINGUAL | 0 refills | Status: DC

## 2020-12-14 NOTE — Telephone Encounter (Signed)
Reviewed notes, PDMP, and last Utox. Approved 1 month refill of her suboxone. Please ask patient to schedule OUD follow up.

## 2021-01-06 ENCOUNTER — Telehealth: Payer: Self-pay

## 2021-01-06 NOTE — Telephone Encounter (Signed)
Requesting OUD appt, please call pt back.

## 2021-01-11 ENCOUNTER — Other Ambulatory Visit: Payer: Self-pay | Admitting: Internal Medicine

## 2021-01-11 DIAGNOSIS — F1121 Opioid dependence, in remission: Secondary | ICD-10-CM

## 2021-01-12 ENCOUNTER — Encounter: Payer: Self-pay | Admitting: *Deleted

## 2021-01-12 ENCOUNTER — Other Ambulatory Visit: Payer: Self-pay | Admitting: Internal Medicine

## 2021-01-12 DIAGNOSIS — F1121 Opioid dependence, in remission: Secondary | ICD-10-CM

## 2021-01-12 MED ORDER — BUPRENORPHINE HCL-NALOXONE HCL 8-2 MG SL FILM: 1.0000 | ORAL_FILM | Freq: Three times a day (TID) | SUBLINGUAL | 0 refills | Status: DC

## 2021-01-12 NOTE — Telephone Encounter (Cosign Needed)
Patient no showed OUD appt on 6/28. She called on 6/30 requesting another appt.   Patient's last OUD OV and UDS 4/14   We are not having OUD clinic this month. We are instructing our patients to call back the week of July 25 th to schedule an appt in August.

## 2021-01-12 NOTE — Telephone Encounter (Signed)
This is being addressed in separate encounter.  

## 2021-02-10 ENCOUNTER — Other Ambulatory Visit: Payer: Self-pay | Admitting: Internal Medicine

## 2021-02-10 ENCOUNTER — Encounter: Payer: Self-pay | Admitting: Internal Medicine

## 2021-02-10 DIAGNOSIS — F1121 Opioid dependence, in remission: Secondary | ICD-10-CM

## 2021-02-10 MED ORDER — BUPRENORPHINE HCL-NALOXONE HCL 8-2 MG SL FILM: 1.0000 | ORAL_FILM | Freq: Three times a day (TID) | SUBLINGUAL | 0 refills | Status: DC

## 2021-03-01 ENCOUNTER — Other Ambulatory Visit: Payer: Self-pay

## 2021-03-01 ENCOUNTER — Ambulatory Visit: Payer: Medicaid Other | Admitting: Student

## 2021-03-01 DIAGNOSIS — F1121 Opioid dependence, in remission: Secondary | ICD-10-CM | POA: Diagnosis present

## 2021-03-01 MED ORDER — BUPRENORPHINE HCL-NALOXONE HCL 8-2 MG SL FILM: 1.0000 | ORAL_FILM | Freq: Three times a day (TID) | SUBLINGUAL | 1 refills | Status: DC

## 2021-03-01 NOTE — Assessment & Plan Note (Addendum)
Patient is here for 8 weeks follow-up.  She was last seen in April.  She reports doing well with no acute issue.  Reports adherence to the Suboxone, which helps reduce craving.  She denies any opioid products or substance use. Last prescription was picked up on 02/10/2021.   She is currently working from home.  States that her children are going back to school which gives her some more free time.    -Continue Suboxone 8-2 mg SL TID -Follow-up in 8 weeks.  Can set up Telehealth next visit if unable to do in person appointment. -Utox today

## 2021-03-01 NOTE — Patient Instructions (Signed)
Ms. Bartram,  It was a pleasure seeing you in the clinic today.  I am glad that you are doing well.  We have sent the prescription of your medication to pharmacy today.  Please return in 2 months,  Take care,   Doran Stabler

## 2021-03-01 NOTE — Progress Notes (Signed)
   03/01/2021  Tonya David presents for follow up of opioid use disorder I have reviewed the prior induction visit, follow up visits, and telephone encounters relevant to opiate use disorder (OUD) treatment.   Current daily dose:   Date of Induction: 08/14/2018  Current follow up interval, in weeks: 8  The patient has been adherent with the buprenorphine for OUD contract.   Last UDS Result: Positive for amphetamine  HPI: Tonya David is a 35 year old female with ODD history who presents to the clinic today for follow-up on her ODD.  She denies any acute complaints.  Exam:   Vitals:   03/01/21 1034  BP: 120/70  Pulse: 96  Temp: 98.6 F (37 C)  TempSrc: Oral  SpO2: 98%  Weight: 146 lb 9.6 oz (66.5 kg)  Height: 5\' 6"  (1.676 m)    Physical Exam Constitutional:      General: She is not in acute distress.    Appearance: She is not toxic-appearing.  HENT:     Head: Normocephalic.  Eyes:     Conjunctiva/sclera: Conjunctivae normal.  Cardiovascular:     Rate and Rhythm: Normal rate and regular rhythm.  Pulmonary:     Effort: Pulmonary effort is normal. No respiratory distress.     Breath sounds: Normal breath sounds. No wheezing.  Musculoskeletal:        General: Normal range of motion.  Skin:    General: Skin is warm.     Coloration: Skin is not jaundiced.  Neurological:     Mental Status: She is alert and oriented to person, place, and time.  Psychiatric:        Mood and Affect: Mood normal.        Behavior: Behavior normal.     Assessment/Plan:  See Problem Based Charting in the Encounters Tab     , DO  03/01/2021  11:17 AM

## 2021-03-01 NOTE — Progress Notes (Signed)
Internal Medicine Clinic Attending  Case discussed with Dr. Nguyen  At the time of the visit.  We reviewed the resident's history and exam and pertinent patient test results.  I agree with the assessment, diagnosis, and plan of care documented in the resident's note. 

## 2021-03-04 LAB — TOXASSURE SELECT,+ANTIDEPR,UR

## 2021-03-09 ENCOUNTER — Encounter: Payer: Self-pay | Admitting: Internal Medicine

## 2021-03-09 ENCOUNTER — Other Ambulatory Visit: Payer: Self-pay | Admitting: Student in an Organized Health Care Education/Training Program

## 2021-03-09 DIAGNOSIS — F1121 Opioid dependence, in remission: Secondary | ICD-10-CM

## 2021-05-05 ENCOUNTER — Other Ambulatory Visit: Payer: Self-pay | Admitting: Student in an Organized Health Care Education/Training Program

## 2021-05-05 ENCOUNTER — Encounter: Payer: Self-pay | Admitting: Internal Medicine

## 2021-05-05 DIAGNOSIS — F1121 Opioid dependence, in remission: Secondary | ICD-10-CM

## 2021-05-05 MED ORDER — BUPRENORPHINE HCL-NALOXONE HCL 8-2 MG SL FILM: 1.0000 | ORAL_FILM | Freq: Three times a day (TID) | SUBLINGUAL | 0 refills | Status: DC

## 2021-05-31 ENCOUNTER — Ambulatory Visit (INDEPENDENT_AMBULATORY_CARE_PROVIDER_SITE_OTHER): Payer: Medicaid Other | Admitting: Internal Medicine

## 2021-05-31 ENCOUNTER — Other Ambulatory Visit: Payer: Self-pay

## 2021-05-31 ENCOUNTER — Encounter: Payer: Self-pay | Admitting: Internal Medicine

## 2021-05-31 VITALS — BP 120/72 | HR 93 | Temp 98.4°F | Ht 66.0 in | Wt 148.4 lb

## 2021-05-31 DIAGNOSIS — R87610 Atypical squamous cells of undetermined significance on cytologic smear of cervix (ASC-US): Secondary | ICD-10-CM | POA: Diagnosis not present

## 2021-05-31 DIAGNOSIS — R509 Fever, unspecified: Secondary | ICD-10-CM | POA: Diagnosis not present

## 2021-05-31 DIAGNOSIS — F1121 Opioid dependence, in remission: Secondary | ICD-10-CM

## 2021-05-31 HISTORY — DX: Fever, unspecified: R50.9

## 2021-05-31 MED ORDER — BUPRENORPHINE HCL-NALOXONE HCL 8-2 MG SL FILM: 1.0000 | ORAL_FILM | Freq: Three times a day (TID) | SUBLINGUAL | 1 refills | Status: DC

## 2021-05-31 NOTE — Assessment & Plan Note (Signed)
Check Flu/COVID/RSV.  Advised rest.

## 2021-05-31 NOTE — Assessment & Plan Note (Signed)
Doing well on suboxone, cravings well controlled. Will continue Suboxone 8-2mg  TID.  Follow up in 8 weeks. Check Utox today, reviewed PMDP and appropriate.

## 2021-05-31 NOTE — Progress Notes (Signed)
   05/31/2021  Tonya David presents for follow up of opioid use disorder I have reviewed the prior induction visit, follow up visits, and telephone encounters relevant to opiate use disorder (OUD) treatment.   Current daily dose: Suboxone 8-2 TID  Date of Induction: 08/14/2018  Current follow up interval, in weeks: 8  The patient has been adherent with the buprenorphine for OUD contract.   Last UDS Result: 03/01/21 appropriate for Buprenorphine and metabolite  HPI: Tonya David is a 35 year old female with ODD history who presents to the clinic today for follow-up on her ODD.  She reports that she came down with a fever, cough and nasal congestion that started on Saturday.  Took home COVID test on Sunday and was negative. Fever has been low grade and overall just trying to rest. No one else sick in the house.  Has had 1 shot of moderna COVID vaccine.  Exam:   Vitals:   05/31/21 1021  BP: 120/72  Pulse: 93  Temp: 98.4 F (36.9 C)  TempSrc: Oral  SpO2: 99%  Weight: 148 lb 6.4 oz (67.3 kg)  Height: 5\' 6"  (1.676 m)   General: comfortable appearing Pulm: no cough during encounter, breathing normally Ext: warm and well perfused, no pedal edema    Assessment/Plan:  See Problem Based Charting in the Encounters Tab    , DO  05/31/2021  10:47 AM

## 2021-05-31 NOTE — Assessment & Plan Note (Signed)
I noticed that she appears to have been lost to follow up after her last pregnancy.  I discussed with her the importance of follow up with GYN for her abnormal PAP.  I have placed a referral.

## 2021-06-01 LAB — COVID-19, FLU A+B AND RSV
Influenza A, NAA: NOT DETECTED
Influenza B, NAA: NOT DETECTED
RSV, NAA: NOT DETECTED
SARS-CoV-2, NAA: NOT DETECTED

## 2021-06-09 LAB — TOXASSURE SELECT,+ANTIDEPR,UR

## 2021-06-29 ENCOUNTER — Encounter: Payer: Self-pay | Admitting: Internal Medicine

## 2021-07-28 ENCOUNTER — Encounter: Payer: Self-pay | Admitting: Internal Medicine

## 2021-07-28 DIAGNOSIS — F1121 Opioid dependence, in remission: Secondary | ICD-10-CM

## 2021-07-28 MED ORDER — BUPRENORPHINE HCL-NALOXONE HCL 8-2 MG SL FILM: 1.0000 | ORAL_FILM | Freq: Three times a day (TID) | SUBLINGUAL | 0 refills | Status: DC

## 2021-08-23 ENCOUNTER — Ambulatory Visit: Payer: Medicaid Other | Admitting: Student

## 2021-08-23 ENCOUNTER — Other Ambulatory Visit: Payer: Self-pay

## 2021-08-23 VITALS — BP 117/70 | HR 81 | Temp 98.1°F | Wt 147.0 lb

## 2021-08-23 DIAGNOSIS — F1121 Opioid dependence, in remission: Secondary | ICD-10-CM | POA: Diagnosis not present

## 2021-08-23 MED ORDER — BUPRENORPHINE HCL-NALOXONE HCL 8-2 MG SL FILM: 1.0000 | ORAL_FILM | Freq: Three times a day (TID) | SUBLINGUAL | 1 refills | Status: DC

## 2021-08-23 NOTE — Progress Notes (Signed)
° °  08/23/2021  Tonya David presents for follow up of opioid use disorder I have reviewed the prior induction visit, follow up visits, and telephone encounters relevant to opiate use disorder (OUD) treatment.   Current daily dose: Suboxone 8-2 TID   Date of Induction: 08/14/2018   Current follow up interval, in weeks: 8   The patient has been adherent with the buprenorphine for OUD contract.    Last UDS Result: 05/31/21 appropriate for Buprenorphine and metabolites.  Inappropriate for hydromorphone and hydrocodone  HPI: 36 year old with a history of OUD here for follow-up on her Suboxone treatment. She denies any cravings.  She has been adherent to the current regimen and has not used any illicit drugs. She reports some recent stressors in her life as her family has moved from the apartment and found a new place to stay. Reports she has 7 children and they always stress her out.  Exam:   Vitals:   08/23/21 0928  BP: 117/70  Pulse: 81  Temp: 98.1 F (36.7 C)  TempSrc: Oral  SpO2: 100%  Weight: 147 lb (66.7 kg)   General: Pleasant, well-appearing middle-age woman. NAD.  CV: RRR. No murmurs, rubs, or gallops. No LE edema Pulmonary: Lungs CTAB. Normal effort. No wheezing or rales. Skin: Warm and dry. No obvious rash or lesions. Neuro: A&Ox3. Moves all extremities. Psych: Normal mood and affect   Assessment/Plan:  See Problem Based Charting in the Encounters Tab   Tonya Rainwater, MD  08/23/2021  9:37 AM

## 2021-08-23 NOTE — Assessment & Plan Note (Addendum)
She endorsed some recent stressors but includes dealing with her 7 kids and trying to move to another apartment.  However she denies any cravings.  Her last UDS on 05/31/2021 showed unexpected hydrocodone and hydromorphone. Patient denies taking any other opioids around that time. Reports that she knows these opioids would not have any significant effect in the setting of her Suboxone treatment. PDMP reviewed. -- Refilled Suboxone 8-2 mg, 1 film 3 times daily. -- Checking UDS today -- Follow-up in 3 months -- Per patient's insurance, only 2 providers can prescribe her Suboxone.

## 2021-08-23 NOTE — Patient Instructions (Signed)
Thank you, Tonya David for allowing [place] to provide your care today. Today we discussed your suboxone use.     My Chart Access: [email or web address]  Please follow-up in 3 months  Please make sure to arrive 15 minutes prior to your next appointment. If you arrive late, you may be asked to reschedule.    We look forward to seeing you next time. Please call our clinic at [phone] if you have any questions or concerns. The best time to call is Monday-Friday from 9am-4pm, but there is someone available 24/7. If after hours or the weekend, call the main hospital number and ask for the Internal Medicine Resident On-Call. If you need medication refills, please notify your pharmacy one week in advance and they will send [place] a request.   Thank you for letting us take part in your care. Wishing you the best!  Lacinda Axon, MD 08/23/2021, 9:56 AM IM Resident, PGY-2 Oswal[facility] Milian 41:10

## 2021-08-25 NOTE — Addendum Note (Signed)
Addended by: Burnell Blanks on: 08/25/2021 11:27 AM   Modules accepted: Level of Service

## 2021-08-25 NOTE — Progress Notes (Signed)
Internal Medicine Clinic Attending  Case discussed with Dr. Amponsah  At the time of the visit.  We reviewed the resident's history and exam and pertinent patient test results.  I agree with the assessment, diagnosis, and plan of care documented in the resident's note.  

## 2021-08-28 LAB — TOXASSURE SELECT,+ANTIDEPR,UR

## 2021-09-17 ENCOUNTER — Encounter: Payer: Self-pay | Admitting: Obstetrics & Gynecology

## 2021-09-19 ENCOUNTER — Encounter: Payer: Self-pay | Admitting: Internal Medicine

## 2021-09-19 DIAGNOSIS — F1121 Opioid dependence, in remission: Secondary | ICD-10-CM

## 2021-09-19 MED ORDER — BUPRENORPHINE HCL-NALOXONE HCL 8-2 MG SL FILM: 1.0000 | ORAL_FILM | Freq: Three times a day (TID) | SUBLINGUAL | 0 refills | Status: DC

## 2021-10-11 ENCOUNTER — Ambulatory Visit: Payer: Medicaid Other | Admitting: Obstetrics & Gynecology

## 2021-10-13 ENCOUNTER — Other Ambulatory Visit: Payer: Self-pay | Admitting: Student in an Organized Health Care Education/Training Program

## 2021-10-13 DIAGNOSIS — F1121 Opioid dependence, in remission: Secondary | ICD-10-CM

## 2021-10-18 ENCOUNTER — Ambulatory Visit (INDEPENDENT_AMBULATORY_CARE_PROVIDER_SITE_OTHER): Payer: Medicaid Other | Admitting: Student

## 2021-10-18 DIAGNOSIS — F1121 Opioid dependence, in remission: Secondary | ICD-10-CM | POA: Diagnosis not present

## 2021-10-18 MED ORDER — BUPRENORPHINE HCL-NALOXONE HCL 8-2 MG SL FILM: 1.0000 | ORAL_FILM | Freq: Three times a day (TID) | SUBLINGUAL | 0 refills | Status: DC

## 2021-10-18 NOTE — Progress Notes (Signed)
Internal Medicine Clinic Attending  Case discussed with Dr. Nguyen  At the time of the visit.  We reviewed the resident's history and exam and pertinent patient test results.  I agree with the assessment, diagnosis, and plan of care documented in the resident's note. 

## 2021-10-18 NOTE — Telephone Encounter (Signed)
Pt had tele-health visit today with OUD MD ?Rx was sent to pharmacy  ?

## 2021-10-18 NOTE — Assessment & Plan Note (Signed)
Patient reports doing well.  She currently taking Suboxone 1 film 3 times daily.  Report correct use of sublingual film.  Denies any side effects.  Denies craving or relapse.  Denies other opioid product use or other substance use.  She is working at home, busy with her kids. ? ?Last tox assure on 2/14 2023 was appropriate. ? ?-Refill Suboxone 8-2 mg 1 film 3 times daily ?-Follow-up in person in 8 weeks ?-Tox assure at next follow-up ?

## 2021-10-18 NOTE — Progress Notes (Signed)
?  [facility] Telephone Encounter ?Continuity Care Appointment ? ?HPI:  ?This telephone encounter was created for Ms. AZURA JEHLE on 10/18/2021 for the following purpose/cc OUD. ? ?Elsie Amis presents for follow up of opioid use disorder I have reviewed the prior induction visit, follow up visits, and telephone encounters relevant to opiate use disorder (OUD) treatment.  ?  ?Current daily dose: Suboxone 8-2 TID ?  ?Date of Induction: 08/14/2018 ?  ?Current follow up interval, in weeks: 8 ?  ?The patient has been adherent with the buprenorphine for OUD contract.  ?  ?Last UDS Result: 08/23/21 appropriate for Buprenorphine and metabolites.   ? ?Patient reports doing well.  She currently taking Suboxone 1 film 3 times daily.  Report correct use of sublingual film.  Denies any side effects.  Denies craving or relapse.  Denies other opioid product use or other substance use.  She is working at home, busy with her kids.  ? ?Past Medical History:  ?Past Medical History:  ?Diagnosis Date  ? Anemia   ? Headache(784.0)   ? Preterm labor   ? Urinary tract infection   ?  ? ?ROS:  ?No side effects from Suboxone  ? ?Assessment / Plan / Recommendations:  ?Please see A&P under problem oriented charting for assessment of the patient's acute and chronic medical conditions.  ?As always, pt is advised that if symptoms worsen or new symptoms arise, they should go to an urgent care facility or to to ER for further evaluation.  ? ?Consent and Medical Decision Making:  ?Patient discussed with Dr. Philipp Ovens ?This is a telephone encounter between Elsie Amis and Gaylan Gerold on 10/18/2021 for [facility]. The visit was conducted with the patient located at home and Gaylan Gerold at [facility]. The patient's identity was confirmed using their DOB and current address. The patient has consented to being evaluated through a telephone encounter and understands the associated risks (an examination cannot be done  and the patient may need to come in for an appointment) / benefits (allows the patient to remain at home, decreasing exposure to coronavirus). I personally spent 4 minutes on medical discussion.   ?  ?

## 2021-11-18 ENCOUNTER — Other Ambulatory Visit: Payer: Self-pay | Admitting: Internal Medicine

## 2021-11-18 DIAGNOSIS — F1121 Opioid dependence, in remission: Secondary | ICD-10-CM

## 2021-11-18 MED ORDER — BUPRENORPHINE HCL-NALOXONE HCL 8-2 MG SL FILM: 1.0000 | ORAL_FILM | Freq: Three times a day (TID) | SUBLINGUAL | 0 refills | Status: DC

## 2021-12-15 ENCOUNTER — Other Ambulatory Visit: Payer: Self-pay

## 2021-12-15 DIAGNOSIS — F1121 Opioid dependence, in remission: Secondary | ICD-10-CM

## 2021-12-15 MED ORDER — BUPRENORPHINE HCL-NALOXONE HCL 8-2 MG SL FILM: 1.0000 | ORAL_FILM | Freq: Three times a day (TID) | SUBLINGUAL | 0 refills | Status: DC

## 2021-12-15 NOTE — Telephone Encounter (Signed)
Buprenorphine HCl-Naloxone HCl (SUBOXONE) [id number] [place], [place] - [address] [place], [place] [address]  Phone:  [phone]  Fax:  [phone]

## 2022-01-13 ENCOUNTER — Telehealth: Payer: Medicaid Other | Admitting: Physician Assistant

## 2022-01-13 DIAGNOSIS — R3989 Other symptoms and signs involving the genitourinary system: Secondary | ICD-10-CM | POA: Diagnosis not present

## 2022-01-13 MED ORDER — CEPHALEXIN 500 MG PO CAPS: 500.0000 mg | ORAL_CAPSULE | Freq: Two times a day (BID) | ORAL | 0 refills | Status: DC

## 2022-01-13 NOTE — Progress Notes (Signed)

## 2022-01-16 ENCOUNTER — Other Ambulatory Visit: Payer: Self-pay

## 2022-01-16 ENCOUNTER — Other Ambulatory Visit: Payer: Self-pay | Admitting: Student in an Organized Health Care Education/Training Program

## 2022-01-16 DIAGNOSIS — F1121 Opioid dependence, in remission: Secondary | ICD-10-CM

## 2022-01-16 MED ORDER — BUPRENORPHINE HCL-NALOXONE HCL 8-2 MG SL FILM: 1.0000 | ORAL_FILM | Freq: Three times a day (TID) | SUBLINGUAL | 0 refills | Status: DC

## 2022-01-16 NOTE — Telephone Encounter (Signed)
Buprenorphine HCl-Naloxone HCl (SUBOXONE) 8-2 MG FILM, REFILL REQUEST @ [id number] [place], [place] - [address] 

## 2022-01-16 NOTE — Telephone Encounter (Signed)
Requesting to speak with a nurse about Buprenorphine HCl-Naloxone HCl (SUBOXONE) 8-2 MG FILM.

## 2022-01-16 NOTE — Telephone Encounter (Signed)
Transmission to pharmacy failed  on Rx written today. Please resend.

## 2022-01-17 ENCOUNTER — Other Ambulatory Visit: Payer: Self-pay | Admitting: Student in an Organized Health Care Education/Training Program

## 2022-01-17 DIAGNOSIS — F1121 Opioid dependence, in remission: Secondary | ICD-10-CM

## 2022-01-17 NOTE — Telephone Encounter (Signed)
Last ToxAssure 08/23/2021.  Last appointment  4/11/2-23.  Next appointment 01/18/2022.

## 2022-01-18 ENCOUNTER — Encounter: Payer: Self-pay | Admitting: Student

## 2022-01-18 ENCOUNTER — Other Ambulatory Visit: Payer: Self-pay

## 2022-01-18 ENCOUNTER — Ambulatory Visit: Payer: Medicaid Other | Admitting: Student

## 2022-01-18 VITALS — BP 129/82 | HR 77 | Temp 98.3°F | Resp 24 | Ht 66.0 in | Wt 148.0 lb

## 2022-01-18 DIAGNOSIS — F1121 Opioid dependence, in remission: Secondary | ICD-10-CM

## 2022-01-18 DIAGNOSIS — Z975 Presence of (intrauterine) contraceptive device: Secondary | ICD-10-CM | POA: Diagnosis not present

## 2022-01-18 NOTE — Assessment & Plan Note (Signed)
Patient report an IUD in place for the last 5 years.  She does not have an OB/GYN doctor.  She is due for Pap smear.  -Referral placed for OB/GYN.

## 2022-01-18 NOTE — Patient Instructions (Addendum)
Tonya David,  It was nice seeing you in the clinic today.  I am glad that you have picked up your Suboxone.  I have placed a referral to an OB/GYN for your IUD.  Please return in 2 months  Take care  Dr. Cyndie Chime

## 2022-01-18 NOTE — Progress Notes (Signed)
   CC: OUD f/u  HPI:  Tonya David presents for follow up of opioid use disorder I have reviewed the prior induction visit, follow up visits, and telephone encounters relevant to opiate use disorder (OUD) treatment.    Current daily dose: Suboxone 8-2 TID   Date of Induction: 08/14/2018   Current follow up interval, in weeks: 8   The patient has been adherent with the buprenorphine for OUD contract.    Last UDS Result: 08/23/21 appropriate for Buprenorphine and metabolites.   Past Medical History:  Diagnosis Date   Anemia    Headache(784.0)    Preterm labor    Urinary tract infection    Review of Systems:  per HPI  Physical Exam:  Vitals:   01/18/22 0842 01/18/22 0847  BP: 139/87 129/82  Pulse: 83 77  Resp: (!) 24   Temp: 98.3 F (36.8 C)   TempSrc: Oral   SpO2: 98%   Weight: 148 lb (67.1 kg)   Height: 5\' 6"  (1.676 m)    Physical Exam Constitutional:      General: She is not in acute distress.    Appearance: She is not ill-appearing.  Eyes:     General:        Right eye: No discharge.        Left eye: No discharge.     Conjunctiva/sclera: Conjunctivae normal.  Cardiovascular:     Rate and Rhythm: Normal rate and regular rhythm.  Pulmonary:     Effort: Pulmonary effort is normal. No respiratory distress.     Breath sounds: Normal breath sounds. No wheezing.  Musculoskeletal:        General: Normal range of motion.     Cervical back: Normal range of motion.  Neurological:     Mental Status: She is alert and oriented to person, place, and time.  Psychiatric:        Behavior: Behavior normal.      Assessment & Plan:   See Encounters Tab for problem based charting.  Opioid use disorder, moderate, in sustained remission [facility]) Patient reports doing well on the current dose of Suboxone.  She denies any side effects from Suboxone such as GI upset or constipation.  She denies any other opioid product use.  She is doing well at home and family is  helping out with babysitting.  Last tox assure in 08/2021 was appropriate.  She last picked up her Suboxone on 01/17/2022 for 30-day supply.  -Obtain tox assure today -Continue Suboxone 8-2 mg 1 film 3 times daily -Follow-up in 3 months  IUD (intrauterine device) in place Patient report an IUD in place for the last 5 years.  She does not have an OB/GYN doctor.  She is due for Pap smear.  -Referral placed for OB/GYN.   Patient discussed with Dr. 03/20/2022

## 2022-01-18 NOTE — Assessment & Plan Note (Addendum)
Patient reports doing well on the current dose of Suboxone.  She denies any side effects from Suboxone such as GI upset or constipation.  She denies any other opioid product use.  She is doing well at home and family is helping out with babysitting.  Last tox assure in 08/2021 was appropriate.  She last picked up her Suboxone on 01/17/2022 for 30-day supply.  -Obtain tox assure today -Continue Suboxone 8-2 mg 1 film 3 times daily -Follow-up in 3 months

## 2022-01-18 NOTE — Progress Notes (Signed)
Internal Medicine Clinic Attending  Case discussed with Dr. Nguyen  At the time of the visit.  We reviewed the resident's history and exam and pertinent patient test results.  I agree with the assessment, diagnosis, and plan of care documented in the resident's note. 

## 2022-01-22 LAB — TOXASSURE SELECT,+ANTIDEPR,UR

## 2022-02-12 ENCOUNTER — Other Ambulatory Visit: Payer: Self-pay | Admitting: Student in an Organized Health Care Education/Training Program

## 2022-02-12 DIAGNOSIS — F1121 Opioid dependence, in remission: Secondary | ICD-10-CM

## 2022-02-13 ENCOUNTER — Other Ambulatory Visit: Payer: Self-pay | Admitting: Student in an Organized Health Care Education/Training Program

## 2022-02-13 DIAGNOSIS — F1121 Opioid dependence, in remission: Secondary | ICD-10-CM

## 2022-02-13 MED ORDER — BUPRENORPHINE HCL-NALOXONE HCL 8-2 MG SL FILM: 1.0000 | ORAL_FILM | Freq: Three times a day (TID) | SUBLINGUAL | 0 refills | Status: DC

## 2022-03-12 ENCOUNTER — Other Ambulatory Visit: Payer: Self-pay | Admitting: Student in an Organized Health Care Education/Training Program

## 2022-03-12 DIAGNOSIS — F1121 Opioid dependence, in remission: Secondary | ICD-10-CM

## 2022-03-13 ENCOUNTER — Other Ambulatory Visit: Payer: Self-pay | Admitting: Student in an Organized Health Care Education/Training Program

## 2022-03-13 DIAGNOSIS — F1121 Opioid dependence, in remission: Secondary | ICD-10-CM

## 2022-03-14 MED ORDER — BUPRENORPHINE HCL-NALOXONE HCL 8-2 MG SL FILM: 1.0000 | ORAL_FILM | Freq: Three times a day (TID) | SUBLINGUAL | 0 refills | Status: DC

## 2022-03-27 ENCOUNTER — Ambulatory Visit: Payer: Medicaid Other | Admitting: Student

## 2022-03-27 ENCOUNTER — Other Ambulatory Visit: Payer: Self-pay

## 2022-03-27 ENCOUNTER — Encounter: Payer: Self-pay | Admitting: Student

## 2022-03-27 VITALS — BP 117/75 | HR 81 | Temp 98.3°F | Ht 66.0 in | Wt 147.0 lb

## 2022-03-27 DIAGNOSIS — F1121 Opioid dependence, in remission: Secondary | ICD-10-CM

## 2022-03-27 DIAGNOSIS — R87610 Atypical squamous cells of undetermined significance on cytologic smear of cervix (ASC-US): Secondary | ICD-10-CM

## 2022-03-27 NOTE — Assessment & Plan Note (Addendum)
Patient had ASCUS on pap in 2017 and reports she still has not followed up with OB-GYN. Patient previously had missed an OBGYN appointment earlier this year and was referred again two months ago, but says she did not hear from their office. Today she reports she has some increased discharge but no vaginal odor/discomfort, dysuria, or abdominal complaints.  Plan: -Referral to [facility]

## 2022-03-27 NOTE — Assessment & Plan Note (Signed)
Patient has chronic stable OUD managed with Suboxone 8-2mg . She reports that she is doing well with her current dose and takes it between 2-4x/day. She is able to fill her prescription and does not run out early. She reports  "teeth chipping" sensations and says she has not seen a dentist recently. She also reports some trouble falling and staying asleep due to stress from her kids, but says she feels energized during the day and it does not interfere with responsibilities. Patient denies cravings,  constipation/abdominal discomfort. Patient denies use of other substances. A&P: Patient is stable on current dose, will refill today and continue to monitor. Last ToxAssure two months ago was appropriate. Suboxone last dispensed 03/14/22 for 30 day supply. Plan:  -Continue suboxone 8-2mg  80film 3x/day  -ToxAssure today -Recommended patient follow up with dentist -Follow up in 3 months

## 2022-03-27 NOTE — Patient Instructions (Addendum)
Thank you for seeing [place] today Tonya David! We are glad you are doing well.   Medications We have sent in refills for your suboxone, please give [place] a call if you have any issues filling your prescription.   2. Referrals We have sent in a referral to OB-GYN to follow up on your last pap smear and you should receive a call from their office.   3. Labwork We did a urine substance screening test today, and you can view your result in MyChart.

## 2022-03-27 NOTE — Progress Notes (Signed)
Subjective:   Patient ID: Tonya David female   DOB: 1985-11-20 36 y.o.   MRN: [id number]  HPI: [name] is a 36 y.o. with the past medical history stated below who presents for suboxone refill. Please see Plan for individualized problem-based charting.   Patient Active Problem List   Diagnosis Date Noted   IUD (intrauterine device) in place 01/18/2022   Fever 05/31/2021   Opioid use disorder, moderate, in sustained remission ([place]) 08/13/2018   Atypical squamous cells of undetermined significance on cytologic smear of cervix (ASC-US) 08/02/2016   History of preterm delivery 03/14/2012     Current Outpatient Medications  Medication Sig Dispense Refill   Buprenorphine HCl-Naloxone HCl (SUBOXONE) 8-2 MG FILM Place 1 Film under the tongue in the morning, at noon, and at bedtime. 90 each 0   cephALEXin (KEFLEX) 500 MG capsule Take 1 capsule (500 mg total) by mouth 2 (two) times daily. 14 capsule 0   No current facility-administered medications for this visit.     Review of Systems: Review of systems is negative other than what is noted in individual problem-based charting.    Objective:   Physical Exam: Vitals:   03/27/22 0858  BP: 117/75  Pulse: 81  Temp: 98.3 F (36.8 C)  TempSrc: Oral  SpO2: 98%  Weight: 147 lb (66.7 kg)  Height: 5\' 6"  (1.676 m)   Physical Exam Constitutional:      General: She is not in acute distress. Cardiovascular:     Rate and Rhythm: Normal rate and regular rhythm.     Heart sounds: No murmur heard. Pulmonary:     Effort: Pulmonary effort is normal. No respiratory distress.     Breath sounds: Normal breath sounds. No wheezing.  Abdominal:     General: Abdomen is flat. There is no distension.     Palpations: Abdomen is soft.     Tenderness: There is no abdominal tenderness. There is no guarding.  Skin:    General: Skin is warm and dry.  Neurological:     Mental Status: She is alert and oriented to person, place,  and time.  Psychiatric:        Mood and Affect: Mood normal.        Behavior: Behavior normal.        Judgment: Judgment normal.      Assessment & Plan:   Opioid use disorder, moderate, in sustained remission ([facility]) Patient has chronic stable OUD managed with Suboxone 8-2mg . She reports that she is doing well with her current dose and takes it between 2-4x/day. She is able to fill her prescription and does not run out early. She reports  "teeth chipping" sensations and says she has not seen a dentist recently. She also reports some trouble falling and staying asleep due to stress from her kids, but says she feels energized during the day and it does not interfere with responsibilities. Patient denies cravings,  constipation/abdominal discomfort. Patient denies use of other substances. A&P: Patient is stable on current dose, will refill today and continue to monitor. Last ToxAssure two months ago was appropriate. Suboxone last dispensed 03/14/22 for 30 day supply. Plan:  -Continue suboxone 8-2mg  69film 3x/day  -ToxAssure today -Recommended patient follow up with dentist -Follow up in 3 months  Atypical squamous cells of undetermined significance on cytologic smear of cervix (ASC-US) Patient had ASCUS on pap in 2017 and reports she still has not followed up with OB-GYN. Patient previously had missed  an OBGYN appointment earlier this year and was referred again two months ago, but says she did not hear from their office. Today she reports she has some increased discharge but no vaginal odor/discomfort, dysuria, or abdominal complaints.  Plan: -Referral to [facility]

## 2022-04-01 LAB — TOXASSURE SELECT,+ANTIDEPR,UR

## 2022-04-04 NOTE — Progress Notes (Signed)
Internal Medicine Clinic Attending  Case discussed with Dr. Braswell and MS 3 Nelson  at the time of the visit.  We reviewed the resident's history and exam and pertinent patient test results.  I agree with the assessment, diagnosis, and plan of care documented in the resident's note.  

## 2022-04-10 ENCOUNTER — Other Ambulatory Visit: Payer: Self-pay

## 2022-04-10 DIAGNOSIS — F1121 Opioid dependence, in remission: Secondary | ICD-10-CM

## 2022-04-10 MED ORDER — BUPRENORPHINE HCL-NALOXONE HCL 8-2 MG SL FILM: 1.0000 | ORAL_FILM | Freq: Three times a day (TID) | SUBLINGUAL | 1 refills | Status: DC

## 2022-04-10 NOTE — Telephone Encounter (Signed)
Patient calling back for refill. States she is completely out.

## 2022-04-10 NOTE — Telephone Encounter (Signed)
Buprenorphine HCl-Naloxone HCl (SUBOXONE) 8-2 MG FILM, REFILL REQUEST @ [id number] [place], [place] - [address].

## 2022-04-10 NOTE — Telephone Encounter (Signed)
Left message on [facility] that Rx we discussed has been sent to pharmacy.

## 2022-04-13 ENCOUNTER — Telehealth: Payer: Medicaid Other | Admitting: Family Medicine

## 2022-04-13 DIAGNOSIS — N644 Mastodynia: Secondary | ICD-10-CM

## 2022-04-13 NOTE — Progress Notes (Signed)
[facility]   Needs to have in person eval of breast tissue and nipple given the symptoms being presented.  Patient acknowledged agreement and understanding of the plan.

## 2022-04-14 ENCOUNTER — Encounter (HOSPITAL_COMMUNITY): Payer: Self-pay | Admitting: Emergency Medicine

## 2022-04-14 ENCOUNTER — Emergency Department (HOSPITAL_COMMUNITY)
Admission: EM | Admit: 2022-04-14 | Discharge: 2022-04-15 | Disposition: A | Discharge status: Home / Self Care | Payer: Medicaid Other | Attending: Emergency Medicine | Admitting: Emergency Medicine

## 2022-04-14 ENCOUNTER — Ambulatory Visit
Admission: RE | Admit: 2022-04-14 | Discharge: 2022-04-14 | Disposition: A | Discharge status: Home / Self Care | Payer: Medicaid Other | Source: Ambulatory Visit | Attending: Emergency Medicine | Admitting: Emergency Medicine

## 2022-04-14 ENCOUNTER — Other Ambulatory Visit: Payer: Self-pay

## 2022-04-14 VITALS — BP 156/80 | HR 87 | Temp 98.2°F | Resp 18

## 2022-04-14 DIAGNOSIS — N611 Abscess of the breast and nipple: Secondary | ICD-10-CM | POA: Insufficient documentation

## 2022-04-14 DIAGNOSIS — N61 Mastitis without abscess: Secondary | ICD-10-CM

## 2022-04-14 DIAGNOSIS — N644 Mastodynia: Secondary | ICD-10-CM

## 2022-04-14 MED ORDER — CEPHALEXIN 500 MG PO CAPS
500.0000 mg | ORAL_CAPSULE | Freq: Four times a day (QID) | ORAL | 0 refills | Status: DC
Start: 2022-04-14 — End: 2022-07-12

## 2022-04-14 NOTE — Discharge Instructions (Addendum)
Your breast ultrasound is scheduled for May 03, 2022 at 9:20 am. Arrive 15 minutes early.  No deodorant or powder.     Take the Keflex as directed.    Go to the emergency department if your symptoms continue or worsen.    Establish a primary care provider as soon as possible.

## 2022-04-14 NOTE — ED Triage Notes (Signed)
Patient presents to UC for right breast swelling/redness and pain x 3 days. Taking ibuprofen.

## 2022-04-14 NOTE — ED Provider Notes (Signed)
[facility]    CSN: [id number] Arrival date & time: 04/14/22  1058      History   Chief Complaint Chief Complaint  Patient presents with   Breast Problem    Swollen nippleBurning radiating through breastSensitive to touch - Entered by patient    HPI Tonya David is a 36 y.o. female.  Patient presents with 3-day history of pain in her right breast and nipple.  She reports pain, swelling, redness of her nipple.  No nipple discharge, mass, lesions, fever, chills, chest pain, shortness of breath, or other symptoms.  Treatment at home with ibuprofen.    The history is provided by the patient and medical records.    Past Medical History:  Diagnosis Date   Anemia    Headache(784.0)    Preterm labor    Urinary tract infection     Patient Active Problem List   Diagnosis Date Noted   IUD (intrauterine device) in place 01/18/2022   Fever 05/31/2021   Opioid use disorder, moderate, in sustained remission ([facility]) 08/13/2018   Atypical squamous cells of undetermined significance on cytologic smear of cervix (ASC-US) 08/02/2016   History of preterm delivery 03/14/2012    Past Surgical History:  Procedure Laterality Date   THERAPEUTIC ABORTION      OB History     Gravida  10   Para  7   Term  2   Preterm  5   AB  3   Living  7      SAB  1   IAB  2   Ectopic  0   Multiple  0   Live Births  7            Home Medications    Prior to Admission medications   Medication Sig Start Date End Date Taking? Authorizing Provider  cephALEXin (KEFLEX) 500 MG capsule Take 1 capsule (500 mg total) by mouth 4 (four) times daily. 04/14/22  Yes Mickie Bail, NP  Buprenorphine HCl-Naloxone HCl (SUBOXONE) 8-2 MG FILM Place 1 Film under the tongue in the morning, at noon, and at bedtime. 04/10/22   Gust Rung, DO  Norethindrone Acetate-Ethinyl Estrad-FE (LOESTRIN 24 FE) 1-20 MG-MCG(24) tablet Take 1 tablet by mouth 1 day or 1 dose. Start in 3  weeks Patient not taking: Reported on 08/24/2014 04/11/14 08/24/14  Antionette Char, MD    Family History Family History  Problem Relation Age of Onset   Cancer Maternal Grandmother    Cancer Maternal Grandfather    Alcohol abuse Neg Hx    Arthritis Neg Hx    Asthma Neg Hx    Birth defects Neg Hx    COPD Neg Hx    Depression Neg Hx    Diabetes Neg Hx    Drug abuse Neg Hx    Early death Neg Hx    Hearing loss Neg Hx    Heart disease Neg Hx    Hypertension Neg Hx    Hyperlipidemia Neg Hx    Kidney disease Neg Hx    Learning disabilities Neg Hx    Mental illness Neg Hx    Mental retardation Neg Hx    Miscarriages / Stillbirths Neg Hx    Stroke Neg Hx    Vision loss Neg Hx    Other Neg Hx     Social History Social History   Tobacco Use   Smoking status: Every Day    Packs/day: 0.50    Types: Cigarettes  Smokeless tobacco: Current   Tobacco comments:    .5 PPD  Substance Use Topics   Alcohol use: Yes    Comment: occasionally   Drug use: No     Allergies   Patient has no known allergies.   Review of Systems Review of Systems  Constitutional:  Negative for chills and fever.  Respiratory:  Negative for cough and shortness of breath.   Cardiovascular:  Negative for chest pain and palpitations.  Genitourinary:  Negative for hematuria.  All other systems reviewed and are negative.    Physical Exam Triage Vital Signs ED Triage Vitals  Enc Vitals Group     BP      Pulse      Resp      Temp      Temp src      SpO2      Weight      Height      Head Circumference      Peak Flow      Pain Score      Pain Loc      Pain Edu?      Excl. in [place]?    No data found.  Updated Vital Signs BP (!) 156/80   Pulse 87   Temp 98.2 F (36.8 C)   Resp 18   LMP 04/03/2022   SpO2 98%   Visual Acuity Right Eye Distance:   Left Eye Distance:   Bilateral Distance:    Right Eye Near:   Left Eye Near:    Bilateral Near:     Physical Exam Vitals and  nursing note reviewed.  Constitutional:      General: She is not in acute distress.    Appearance: Normal appearance. She is well-developed. She is not ill-appearing.  HENT:     Mouth/Throat:     Mouth: Mucous membranes are moist.  Cardiovascular:     Rate and Rhythm: Normal rate and regular rhythm.     Heart sounds: Normal heart sounds.  Pulmonary:     Effort: Pulmonary effort is normal. No respiratory distress.     Breath sounds: Normal breath sounds.  Chest:     Comments: Right nipple tender to palpation, mildly edematous, mildly erythematous; no discharge.  No palpable breast mass.  Musculoskeletal:     Cervical back: Neck supple.  Skin:    General: Skin is warm and dry.  Neurological:     Mental Status: She is alert.  Psychiatric:        Mood and Affect: Mood normal.        Behavior: Behavior normal.      UC Treatments / Results  Labs (all labs ordered are listed, but only abnormal results are displayed) Labs Reviewed - No data to display  EKG   Radiology No results found.  Procedures Procedures (including critical care time)  Medications Ordered in UC Medications - No data to display  Initial Impression / Assessment and Plan / UC Course  I have reviewed the triage vital signs and the nursing notes.  Pertinent labs & imaging results that were available during my care of the patient were reviewed by me and considered in my medical decision making (see chart for details).   Right breast pain and nipple tenderness; cellulitis of right breast.  Breast ultrasound scheduled for the soonest available which is 05/03/2022.  Treating today with Keflex.  Instructed patient to establish a PCP as soon as possible.  Instructed her to go to the  emergency department if her symptoms persist or worsen.  She agrees to plan of care.   Final Clinical Impressions(s) / UC Diagnoses   Final diagnoses:  Breast pain, right  Nipple tenderness  Cellulitis of right breast      Discharge Instructions      Your breast ultrasound is scheduled for May 03, 2022 at 9:20 am. Arrive 15 minutes early.  No deodorant or powder.     Take the Keflex as directed.    Go to the emergency department if your symptoms continue or worsen.    Establish a primary care provider as soon as possible.         ED Prescriptions     Medication Sig Dispense Auth. Provider   cephALEXin (KEFLEX) 500 MG capsule Take 1 capsule (500 mg total) by mouth 4 (four) times daily. 28 capsule Mickie Bail, NP      PDMP not reviewed this encounter.   Mickie Bail, NP 04/14/22 [phone]

## 2022-04-14 NOTE — ED Triage Notes (Signed)
Pt reported to ED with c/o pain to rt breast x3 days. Pt reports recent onset of menses and initially felt pain was related but then noticed pain has increased.

## 2022-04-15 MED ORDER — SULFAMETHOXAZOLE-TRIMETHOPRIM 800-160 MG PO TABS: 1.0000 | ORAL_TABLET | Freq: Two times a day (BID) | ORAL | 0 refills | Status: AC

## 2022-04-15 MED ORDER — KETOROLAC TROMETHAMINE 60 MG/2ML IM SOLN
30.0000 mg | Freq: Once | INTRAMUSCULAR | Status: AC
  Administered 2022-04-15: 30 mg via INTRAMUSCULAR
  Filled 2022-04-15: qty 2

## 2022-04-15 MED ORDER — LIDOCAINE-PRILOCAINE 2.5-2.5 % EX CREA
TOPICAL_CREAM | Freq: Once | CUTANEOUS | Status: AC
  Filled 2022-04-15 (×2): qty 5

## 2022-04-15 MED ORDER — SULFAMETHOXAZOLE-TRIMETHOPRIM 800-160 MG PO TABS
1.0000 | ORAL_TABLET | Freq: Once | ORAL | Status: AC
  Administered 2022-04-15: 1 via ORAL
  Filled 2022-04-15: qty 1

## 2022-04-15 NOTE — ED Notes (Signed)
Pt reports 8/10 right breast pain that is sensitive to touch, swollen, and feels a burning sensation when pressure is applied. Pt reports that the pain is felt on the right nipple, however radiates to the entire right breast when pressure is applied.

## 2022-04-15 NOTE — ED Notes (Signed)
MD at bedside. 

## 2022-04-15 NOTE — ED Provider Notes (Signed)
[facility] EMERGENCY DEPARTMENT Provider Note  CSN: [id number] Arrival date & time: 04/14/22 2249  Chief Complaint(s) Breast Pain  HPI Tonya David is a 36 y.o. female here for several days of right breast pain and swelling.  Seen yesterday at urgent care and diagnosed with cellulitis.  Started on Keflex.  Pain got worse prompting a visit.  Patient denies breast-feeding.  Last menstrual cycle was last week.  The denies any fevers or chills.  No trauma.  No other physical complaints.  The history is provided by the patient.    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 Authorizing Provider  sulfamethoxazole-trimethoprim (BACTRIM DS) 800-160 MG tablet Take 1 tablet by mouth 2 (two) times daily for 7 days. 04/15/22 04/22/22 Yes Bert Ptacek, Grayce Sessions, MD  Buprenorphine HCl-Naloxone HCl (SUBOXONE) 8-2 MG FILM Place 1 Film under the tongue in the morning, at noon, and at bedtime. 04/10/22   Lucious Groves, DO  cephALEXin (KEFLEX) 500 MG capsule Take 1 capsule (500 mg total) by mouth 4 (four) times daily. 04/14/22   Sharion Balloon, NP  Norethindrone Acetate-Ethinyl Estrad-FE (LOESTRIN 24 FE) 1-20 MG-MCG(24) tablet Take 1 tablet by mouth 1 day or 1 dose. Start in 3 weeks Patient not taking: Reported on 08/24/2014 04/11/14 08/24/14  Lahoma Crocker, MD                                                                                                                                     Allergies Patient has no known allergies.  Review of Systems Review of Systems As noted in HPI  Physical Exam Vital Signs  I have reviewed the triage vital signs BP 124/80   Pulse 63   Temp 98.3 F (36.8 C) (Oral)   Resp 16   LMP 04/03/2022   SpO2 98%   Physical Exam Vitals reviewed.  Constitutional:      General: She is not in acute distress.    Appearance: She is well-developed. She is not diaphoretic.  HENT:     Head: Normocephalic and atraumatic.     Right Ear: External ear normal.     Left Ear: External ear normal.     Nose: Nose normal.  Eyes:     General: No scleral icterus.    Conjunctiva/sclera: Conjunctivae normal.  Neck:     Trachea: Phonation normal.  Cardiovascular:  Rate and Rhythm: Normal rate and regular rhythm.  Pulmonary:     Effort: Pulmonary effort is normal. No respiratory distress.     Breath sounds: No stridor.  Chest:  Breasts:    Right: Swelling and tenderness present. No bleeding or inverted nipple.     Left: No bleeding, inverted nipple, nipple discharge, skin change or tenderness.  Abdominal:     General: There is no distension.  Musculoskeletal:        General: Normal range of motion.     Cervical back: Normal range of motion.  Neurological:     Mental Status: She is alert and oriented to person, place, and time.  Psychiatric:        Behavior: Behavior normal.     ED Results and Treatments Labs (all labs ordered are listed, but only abnormal results are displayed) Labs Reviewed - No data to display                                                                                                                       EKG  EKG Interpretation  Date/Time:    Ventricular Rate:    PR Interval:    QRS Duration:   QT Interval:    QTC Calculation:   R Axis:     Text Interpretation:         Radiology No results found.  Medications Ordered in ED Medications  sulfamethoxazole-trimethoprim  (BACTRIM DS) 800-160 MG per tablet 1 tablet (has no administration in time range)  ketorolac (TORADOL) injection 30 mg (has no administration in time range)  lidocaine-prilocaine (EMLA) cream ( Topical Given 04/15/22 0530)                                                                                                                                     Procedures .Marland KitchenIncision and Drainage  Date/Time: 04/15/2022 6:26 AM  Performed by: Fatima Blank, MD Authorized by: Fatima Blank, MD   Consent:    Consent obtained:  Verbal   Consent given by:  Patient   Risks discussed:  Incomplete drainage   Alternatives discussed:  Alternative treatment Universal protocol:    Procedure explained and questions answered to patient or proxy's satisfaction: yes     Patient identity confirmed:  Verbally with patient and arm band Location:    Type:  Abscess   Location:  Trunk   Trunk location:  R breast Pre-procedure details:    Skin preparation:  Chlorhexidine Anesthesia:    Anesthesia method:  Topical application   Topical anesthetic:  EMLA cream Procedure type:    Complexity:  Simple Procedure details:    Needle aspiration: yes     Needle size:  18 G   Drainage:  Purulent   Drainage amount:  Moderate Post-procedure details:    Procedure completion:  Tolerated   (including critical care time)  Medical Decision Making / ED Course   Medical Decision Making Risk Prescription drug management.    Right breast tenderness Concerning for abscess vs clogged duct. Aspiration confirmed abscess. Drained as above. Added bactrim to Abx regimen.      Final Clinical Impression(s) / ED Diagnoses Final diagnoses:  Abscess of breast   The patient appears reasonably screened and/or stabilized for discharge and I doubt any other medical condition or other [facility] requiring further screening, evaluation, or treatment in the ED at this time. I have discussed the findings, Dx and Tx plan  with the patient/family who expressed understanding and agree(s) with the plan. Discharge instructions discussed at length. The patient/family was given strict return precautions who verbalized understanding of the instructions. No further questions at time of discharge.  Disposition: Discharge  Condition: Good  ED Discharge Orders          Ordered    sulfamethoxazole-trimethoprim (BACTRIM DS) 800-160 MG tablet  2 times daily        04/15/22 [id number]              Follow Up: Primary care provider  Call  if you do not have a primary care physician, contact [facility] at [phone] for referral           This chart was dictated using voice recognition software.  Despite best efforts to proofread,  errors can occur which can change the documentation meaning.    Fatima Blank, MD 04/15/22 [id number]

## 2022-05-03 ENCOUNTER — Ambulatory Visit
Admit: 2022-05-03 | Discharge: 2022-05-03 | Disposition: A | Discharge status: Home / Self Care | Payer: Medicaid Other | Attending: Emergency Medicine | Admitting: Emergency Medicine

## 2022-05-03 ENCOUNTER — Ambulatory Visit
Admission: RE | Admit: 2022-05-03 | Discharge: 2022-05-03 | Disposition: A | Discharge status: Home / Self Care | Payer: Medicaid Other | Source: Ambulatory Visit | Attending: Emergency Medicine | Admitting: Emergency Medicine

## 2022-05-03 DIAGNOSIS — N644 Mastodynia: Secondary | ICD-10-CM

## 2022-06-05 ENCOUNTER — Telehealth: Payer: Self-pay | Admitting: *Deleted

## 2022-06-05 ENCOUNTER — Encounter: Payer: Self-pay | Admitting: Family Medicine

## 2022-06-05 ENCOUNTER — Telehealth: Payer: Self-pay | Admitting: Gastroenterology

## 2022-06-05 DIAGNOSIS — F1121 Opioid dependence, in remission: Secondary | ICD-10-CM

## 2022-06-05 NOTE — Telephone Encounter (Signed)
Patient called in stating she is completely out of suboxone and is requesting refill.  #90 with 1 refill sent 10/2 so should have 4 days left. States her doctors know that some days she has to take more if she stays up later.  Patient confirmed appt for 11/30 at 0915.

## 2022-06-05 NOTE — Telephone Encounter (Signed)
This request has been sent in separate encounter.

## 2022-06-05 NOTE — Telephone Encounter (Signed)
Buprenorphine HCl-Naloxone HCl (SUBOXONE) 8-2 MG FILM   [id number] - [place], [place] - [address]

## 2022-06-06 ENCOUNTER — Other Ambulatory Visit: Payer: Self-pay

## 2022-06-06 DIAGNOSIS — F1121 Opioid dependence, in remission: Secondary | ICD-10-CM

## 2022-06-06 MED ORDER — BUPRENORPHINE HCL-NALOXONE HCL 8-2 MG SL FILM: 1.0000 | ORAL_FILM | Freq: Three times a day (TID) | SUBLINGUAL | 1 refills | Status: DC

## 2022-06-07 NOTE — Telephone Encounter (Signed)
Refill sent.

## 2022-06-08 ENCOUNTER — Encounter: Payer: Medicaid Other | Admitting: Student

## 2022-07-05 ENCOUNTER — Other Ambulatory Visit: Payer: Self-pay | Admitting: *Deleted

## 2022-07-05 DIAGNOSIS — F1121 Opioid dependence, in remission: Secondary | ICD-10-CM

## 2022-07-05 MED ORDER — BUPRENORPHINE HCL-NALOXONE HCL 8-2 MG SL FILM: 1.0000 | ORAL_FILM | Freq: Three times a day (TID) | SUBLINGUAL | 0 refills | Status: DC

## 2022-07-05 NOTE — Telephone Encounter (Signed)
Needs a refill on Suboxone. Next appt scheduled 07/12/22.

## 2022-07-06 ENCOUNTER — Encounter: Payer: Medicaid Other | Admitting: Student

## 2022-07-12 ENCOUNTER — Ambulatory Visit: Payer: Medicaid Other | Admitting: Internal Medicine

## 2022-07-12 ENCOUNTER — Other Ambulatory Visit: Payer: Self-pay

## 2022-07-12 ENCOUNTER — Other Ambulatory Visit (HOSPITAL_COMMUNITY)
Admission: RE | Admit: 2022-07-12 | Discharge: 2022-07-12 | Disposition: A | Discharge status: Home / Self Care | Payer: Medicaid Other | Source: Ambulatory Visit | Attending: Student in an Organized Health Care Education/Training Program | Admitting: Student in an Organized Health Care Education/Training Program

## 2022-07-12 ENCOUNTER — Encounter: Payer: Self-pay | Admitting: Internal Medicine

## 2022-07-12 VITALS — BP 126/86 | HR 87 | Temp 98.3°F | Resp 28 | Ht 66.0 in | Wt 148.6 lb

## 2022-07-12 DIAGNOSIS — R87610 Atypical squamous cells of undetermined significance on cytologic smear of cervix (ASC-US): Secondary | ICD-10-CM

## 2022-07-12 DIAGNOSIS — Z0000 Encounter for general adult medical examination without abnormal findings: Secondary | ICD-10-CM

## 2022-07-12 DIAGNOSIS — Z23 Encounter for immunization: Secondary | ICD-10-CM

## 2022-07-12 DIAGNOSIS — F1121 Opioid dependence, in remission: Secondary | ICD-10-CM | POA: Diagnosis not present

## 2022-07-12 MED ORDER — BUPRENORPHINE HCL-NALOXONE HCL 8-2 MG SL FILM: 1.0000 | ORAL_FILM | Freq: Three times a day (TID) | SUBLINGUAL | 2 refills | Status: DC

## 2022-07-12 NOTE — Progress Notes (Signed)
   CC: follow up  HPI:  [name] is a 37 y.o. with medical history of opioid use disorder, atypical squamous cell of undetermined significance (ASCUS)  presenting to [facility] for OUD.   Please see problem-based list for further details, assessments, and plans.  Past Medical History:  Diagnosis Date   Anemia    Fever 05/31/2021   Headache(784.0)    Preterm labor    Urinary tract infection        Current Outpatient Medications (Analgesics):    Buprenorphine HCl-Naloxone HCl (SUBOXONE) 8-2 MG FILM, Place 1 Film under the tongue in the morning, at noon, and at bedtime.    Review of Systems:  Review of system negative unless stated in the problem list or HPI.    Physical Exam:  Vitals:   07/12/22 0930  BP: 126/86  Pulse: 87  Resp: (!) 28  Temp: 98.3 F (36.8 C)  TempSrc: Oral  SpO2: 100%  Weight: 148 lb 9.6 oz (67.4 kg)  Height: 5\' 6"  (1.676 m)    Physical Exam General: NAD HENT: NCAT Lungs: CTAB, no wheeze, rhonchi or rales.  Cardiovascular: Normal heart sounds, no r/m/g, 2+ pulses in all extremities. No LE edema Abdomen: No TTP, normal bowel sounds Pelvic: No lesions noted upon examination of vulva, pt cervix visualized, she had IUD in place. No bleeding or drainage noted.  MSK: No asymmetry or muscle atrophy.  Skin: no lesions noted on exposed skin Neuro: Alert and oriented x4. CN grossly intact Psych: Normal mood and normal affect   Assessment & Plan:   Opioid use disorder, moderate, in sustained remission (H[facility]) Pt on Suboxone TID and has been doing well. No cravings, or relapses. No missed doses. Plan to continue at current dose with follow up every 3 months. Pt agreeable and appreciative of this. She stated she wanted to wean down to BID in the future. Utox ordered this visit.  -follow up utox.  -Refill Suboxone with 2 refills.   Atypical squamous cells of undetermined significance on cytologic smear of cervix (ASC-US) Pap smear done this visit.  Will follow up on her gynecology referral as she was previously referred and may need colposcopy given her hx.  -Follow up on pap smear and her gynecology referral.   Health care maintenance Flu shot given this visit. Pap smear done this visit. Pt declines tetanus shot.    See Encounters Tab for problem based charting.  Patient discussed with Dr. Oren Binet, MD Tillie Rung. [facility] Internal Medicine Residency, PGY-2

## 2022-07-12 NOTE — Patient Instructions (Addendum)
Tonya David, it was a pleasure seeing you today! You endorsed feeling well today. Below are some of the things we talked about this visit. We look forward to seeing you in the follow up appointment!  Today we discussed: We will check your urine today and refill your meds. We will also give you a flu shot. I will ensure you have follow up with gynecology. You received a pap smear today.   I have ordered the following labs today:  Lab Orders  No laboratory test(s) ordered today      Referrals ordered today:   Referral Orders  No referral(s) requested today     I have ordered the following medication/changed the following medications:   Stop the following medications: Medications Discontinued During This Encounter  Medication Reason   cephALEXin (KEFLEX) 500 MG capsule Completed Course   Buprenorphine HCl-Naloxone HCl (SUBOXONE) 8-2 MG FILM Reorder     Start the following medications: Meds ordered this encounter  Medications   Buprenorphine HCl-Naloxone HCl (SUBOXONE) 8-2 MG FILM    Sig: Place 1 Film under the tongue in the morning, at noon, and at bedtime.    Dispense:  90 each    Refill:  2     Follow-up: 3 month follow up with PCP  Please make sure to arrive 15 minutes prior to your next appointment. If you arrive late, you may be asked to reschedule.   We look forward to seeing you next time. Please call our clinic at [phone] if you have any questions or concerns. The best time to call is Monday-Friday from 9am-4pm, but there is someone available 24/7. If after hours or the weekend, call the main hospital number and ask for the Internal Medicine Resident On-Call. If you need medication refills, please notify your pharmacy one week in advance and they will send [place] a request.  Thank you for letting us take part in your care. Wishing you the best!  Thank you, Idamae Schuller, MD

## 2022-07-14 DIAGNOSIS — Z0000 Encounter for general adult medical examination without abnormal findings: Secondary | ICD-10-CM | POA: Insufficient documentation

## 2022-07-14 NOTE — Assessment & Plan Note (Signed)
Flu shot given this visit. Pap smear done this visit. Pt declines tetanus shot.

## 2022-07-14 NOTE — Assessment & Plan Note (Signed)
Pap smear done this visit. Will follow up on her gynecology referral as she was previously referred and may need colposcopy given her hx.  -Follow up on pap smear and her gynecology referral.

## 2022-07-14 NOTE — Assessment & Plan Note (Addendum)
Pt on Suboxone TID and has been doing well. No cravings, or relapses. No missed doses. Plan to continue at current dose with follow up every 3 months. Pt agreeable and appreciative of this. She stated she wanted to wean down to BID in the future. Utox ordered this visit.  -follow up utox.  -Refill Suboxone with 2 refills.

## 2022-07-15 LAB — TOXASSURE SELECT,+ANTIDEPR,UR

## 2022-07-20 ENCOUNTER — Telehealth: Payer: Self-pay

## 2022-07-20 LAB — CYTOLOGY - PAP
Comment: NEGATIVE
Comment: NEGATIVE
Comment: NEGATIVE
Diagnosis: UNDETERMINED — AB
HPV 16: NEGATIVE
HPV 18 / 45: NEGATIVE
High risk HPV: POSITIVE — AB

## 2022-07-20 NOTE — Addendum Note (Signed)
Addended by: Idamae Schuller on: 07/20/2022 10:07 AM   Modules accepted: Orders

## 2022-07-20 NOTE — Telephone Encounter (Signed)
Requesting to speak with Dr. Humphrey Rolls for test results. Please call pt back.

## 2022-07-20 NOTE — Progress Notes (Addendum)
Internal Medicine Clinic Attending  Case discussed with Dr. Humphrey Rolls  At the time of the visit.  We reviewed the resident's history and exam and pertinent patient test results.  I agree with the assessment, diagnosis, and plan of care documented in the resident's note.  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 Discussed with Dr. Humphrey Rolls - will refer patient to gyn and send for HPV genotyping

## 2022-07-20 NOTE — Addendum Note (Signed)
Addended by: Idamae Schuller on: 07/20/2022 08:55 AM   Modules accepted: Orders

## 2022-09-13 ENCOUNTER — Encounter: Payer: Medicaid Other | Admitting: Internal Medicine

## 2022-09-13 NOTE — Progress Notes (Deleted)
   CC: Follow up   HPI:  [name] is a 37 y.o. with medical history of opioid use disorder, ASCUS presenting to [facility] for follow up.   Please see problem-based list for further details, assessments, and plans.  Past Medical History:  Diagnosis Date   Anemia    Fever 05/31/2021   Headache(784.0)    Preterm labor    Urinary tract infection        Current Outpatient Medications (Analgesics):    Buprenorphine HCl-Naloxone HCl (SUBOXONE) 8-2 MG FILM, Place 1 Film under the tongue in the morning, at noon, and at bedtime.    Review of Systems:  Review of system negative unless stated in the problem list or HPI.    Physical Exam:  There were no vitals filed for this visit. Physical Exam General: NAD HENT: NCAT Lungs: CTAB, no wheeze, rhonchi or rales.  Cardiovascular: Normal heart sounds, no r/m/g, 2+ pulses in all extremities. No LE edema Abdomen: No TTP, normal bowel sounds MSK: No asymmetry or muscle atrophy.  Skin: no lesions noted on exposed skin Neuro: Alert and oriented x4. CN grossly intact Psych: Normal mood and normal affect   Assessment & Plan:   No problem-specific Assessment & Plan notes found for this encounter.   See Encounters Tab for problem based charting.  Patient Discussed with Dr. {NAMES:[id number]::"Guilloud","Hoffman","Mullen","Narendra","Vincent","Machen","Lau","Hatcher","Williams"} Idamae Schuller, MD Tillie Rung. [facility] Internal Medicine Residency, PGY-2    [place] for [facility] [place]  [phone]

## 2022-09-26 ENCOUNTER — Encounter: Payer: Medicaid Other | Admitting: Student

## 2022-09-26 ENCOUNTER — Other Ambulatory Visit: Payer: Self-pay

## 2022-09-26 DIAGNOSIS — F1121 Opioid dependence, in remission: Secondary | ICD-10-CM

## 2022-09-26 MED ORDER — BUPRENORPHINE HCL-NALOXONE HCL 8-2 MG SL FILM: 1.0000 | ORAL_FILM | Freq: Three times a day (TID) | SUBLINGUAL | 0 refills | Status: DC

## 2022-09-26 NOTE — Telephone Encounter (Signed)
Pt requesting to talk to a nurse. Pt was informed med refill has been requested and sent to the PCP. Pt stated she was told it will take 48 hrs; she stated she needs a refill by tomorrow.  I told pt our policy - doctors are given up to 48 hrs to refill meds; it might be done today or tomorrow - pt became upset and stated it has to be done by tomorrow . And hung up the phone.

## 2022-09-28 ENCOUNTER — Encounter: Payer: Self-pay | Admitting: Internal Medicine

## 2022-10-02 ENCOUNTER — Ambulatory Visit (INDEPENDENT_AMBULATORY_CARE_PROVIDER_SITE_OTHER): Payer: Medicaid Other

## 2022-10-02 VITALS — BP 133/67 | HR 88 | Temp 98.1°F | Ht 66.0 in | Wt 154.7 lb

## 2022-10-02 DIAGNOSIS — F1121 Opioid dependence, in remission: Secondary | ICD-10-CM | POA: Diagnosis not present

## 2022-10-02 DIAGNOSIS — R87610 Atypical squamous cells of undetermined significance on cytologic smear of cervix (ASC-US): Secondary | ICD-10-CM | POA: Diagnosis present

## 2022-10-02 MED ORDER — BUPRENORPHINE HCL-NALOXONE HCL 8-2 MG SL FILM: 1.0000 | ORAL_FILM | Freq: Three times a day (TID) | SUBLINGUAL | 1 refills | Status: DC

## 2022-10-02 NOTE — Assessment & Plan Note (Signed)
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 Referral has been sent to gynecology. If she is unable to schedule, will send new referral. -f/u at next OV in 3 months to ensure she has been evaluated by gynecology

## 2022-10-02 NOTE — Progress Notes (Signed)
Internal Medicine Clinic Attending  Case discussed with Dr. White  At the time of the visit.  We reviewed the resident's history and exam and pertinent patient test results.  I agree with the assessment, diagnosis, and plan of care documented in the resident's note.  

## 2022-10-02 NOTE — Assessment & Plan Note (Signed)
Patient continues on Suboxone TID. She reports not having relapsed for 4 years now. Denies side effects of medication including GI upset, constipation. Toxassure appropriate at last visit. Denies missed doses. Denies cravings. Currently has about 25 day supply remaining. -will hold off on utox this visit -suboxone 2 refills sent -f/u in 3 months

## 2022-10-02 NOTE — Progress Notes (Signed)
   CC: OUD  HPI:  [name] is a 37 y.o. with past medical history as below who presents for follow-up of OUD. Please see detailed assessment and plan for HPI.  Past Medical History:  Diagnosis Date   Anemia    Fever 05/31/2021   Headache(784.0)    Preterm labor    Urinary tract infection    Review of Systems: See detailed assessment and plan for review of symptoms.  Physical Exam:  Vitals:   10/02/22 0858  BP: 133/67  Pulse: 88  Temp: 98.1 F (36.7 C)  TempSrc: Oral  SpO2: 99%  Weight: 154 lb 11.2 oz (70.2 kg)  Height: 5\' 6"  (1.676 m)   Physical Exam Constitutional:      General: She is not in acute distress. Eyes:     Extraocular Movements: Extraocular movements intact.  Cardiovascular:     Rate and Rhythm: Normal rate and regular rhythm.  Pulmonary:     Effort: Pulmonary effort is normal.     Breath sounds: Normal breath sounds.  Skin:    General: Skin is warm and dry.  Neurological:     Mental Status: She is alert and oriented to person, place, and time.  Psychiatric:        Mood and Affect: Mood normal.        Behavior: Behavior normal.      Assessment & Plan:   See Encounters Tab for problem based charting.  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 Referral has been sent to gynecology. If she is unable to schedule, will send new referral. -f/u at next OV in 3 months to ensure she has been evaluated by gynecology  Opioid use disorder, moderate, in sustained remission [facility]) Patient continues on Suboxone TID. She reports not having relapsed for 4 years now. Denies side effects of medication including GI upset, constipation. Toxassure appropriate at last visit. Denies missed doses. Denies cravings. Currently has about 25 day supply remaining. -will hold off on utox this visit -suboxone 2 refills  sent -f/u in 3 months    Patient discussed with Dr. Philipp Ovens

## 2022-11-14 ENCOUNTER — Ambulatory Visit: Payer: Medicaid Other | Admitting: Family Medicine

## 2022-12-22 ENCOUNTER — Other Ambulatory Visit: Payer: Self-pay

## 2022-12-22 DIAGNOSIS — F1121 Opioid dependence, in remission: Secondary | ICD-10-CM

## 2022-12-24 MED ORDER — BUPRENORPHINE HCL-NALOXONE HCL 8-2 MG SL FILM: 1.0000 | ORAL_FILM | Freq: Three times a day (TID) | SUBLINGUAL | 1 refills | Status: DC

## 2022-12-25 ENCOUNTER — Telehealth: Payer: Self-pay | Admitting: *Deleted

## 2022-12-25 NOTE — Telephone Encounter (Signed)
Pt had called and stated Suboxone rx was written by Dr Welton Flakes and the pharmacy told her it will cost a lot more. Need another rx written by last provider in order for Medicaid to pay. [facility] @ [facility]PM[facility] - stated they fix it. She stated when Medicaid sees a resident DEA#, they will not approve. And med is ready to be picked up. I called pt -informed med is ready.

## 2023-01-02 NOTE — Progress Notes (Deleted)
Patient continues on Suboxone TID. She reports not having relapsed for 4 years now. Denies side effects of medication including GI upset, constipation. Toxassure appropriate at last visit. Denies missed doses. Denies cravings. Currently has about 25 day supply remaining. 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 Referral has been sent to gynecology. If she is unable to schedule, will send new referral. -f/u at next OV in 3 months to ensure she has been evaluated by gynecology -referral closed patient refused service

## 2023-02-20 ENCOUNTER — Encounter: Payer: Self-pay | Admitting: Family Medicine

## 2023-02-22 ENCOUNTER — Other Ambulatory Visit: Payer: Self-pay

## 2023-02-22 DIAGNOSIS — F1121 Opioid dependence, in remission: Secondary | ICD-10-CM

## 2023-02-23 MED ORDER — BUPRENORPHINE HCL-NALOXONE HCL 8-2 MG SL FILM: 1.0000 | ORAL_FILM | Freq: Three times a day (TID) | SUBLINGUAL | 0 refills | Status: DC

## 2023-02-23 NOTE — Telephone Encounter (Signed)
Incoming fax from pharmacy  Medication:Suboxone Message to prescriber: "Prescriber not authorized to prescriber this DEA class of medication.. please resend rx under different prescriber"

## 2023-02-23 NOTE — Telephone Encounter (Signed)
Patient has not been seen since March 2024. Will reach out to front desk for follow up appointment for OUD

## 2023-02-26 ENCOUNTER — Other Ambulatory Visit: Payer: Self-pay

## 2023-02-26 DIAGNOSIS — F1121 Opioid dependence, in remission: Secondary | ICD-10-CM

## 2023-02-26 MED ORDER — BUPRENORPHINE HCL-NALOXONE HCL 8-2 MG SL FILM: 1.0000 | ORAL_FILM | Freq: Three times a day (TID) | SUBLINGUAL | 0 refills | Status: DC

## 2023-03-09 ENCOUNTER — Encounter: Payer: Medicaid Other | Admitting: Internal Medicine

## 2023-03-22 ENCOUNTER — Other Ambulatory Visit: Payer: Self-pay

## 2023-03-22 ENCOUNTER — Other Ambulatory Visit: Payer: Self-pay | Admitting: Student

## 2023-03-22 ENCOUNTER — Ambulatory Visit: Payer: Medicaid Other | Admitting: Student

## 2023-03-22 ENCOUNTER — Encounter: Payer: Self-pay | Admitting: Student

## 2023-03-22 VITALS — BP 125/67 | HR 90 | Temp 98.3°F | Ht 66.0 in | Wt 154.7 lb

## 2023-03-22 DIAGNOSIS — Z0000 Encounter for general adult medical examination without abnormal findings: Secondary | ICD-10-CM

## 2023-03-22 DIAGNOSIS — F1121 Opioid dependence, in remission: Secondary | ICD-10-CM

## 2023-03-22 DIAGNOSIS — F1191 Opioid use, unspecified, in remission: Secondary | ICD-10-CM

## 2023-03-22 DIAGNOSIS — R87610 Atypical squamous cells of undetermined significance on cytologic smear of cervix (ASC-US): Secondary | ICD-10-CM

## 2023-03-22 MED ORDER — BUPRENORPHINE HCL-NALOXONE HCL 8-2 MG SL FILM: 1.0000 | ORAL_FILM | Freq: Three times a day (TID) | SUBLINGUAL | 2 refills | Status: AC

## 2023-03-22 NOTE — Assessment & Plan Note (Addendum)
Patient had a prior Pap smear that demonstrated ASCUS, HPV positive and was never followed up.  She says that despite several referrals being sent in the past, she has not received any calls from them to set up an appointment.  I will place a referral to OB/GYN today and instructed her to call us if she is not contacted in the next two weeks so we can make sure she gets an appointment to follow this up.

## 2023-03-22 NOTE — Progress Notes (Signed)
CC: OUD follow up  HPI:  Tonya David is a 37 y.o. female living with a history stated below and presents today for opioid use disorder follow up. Please see problem based assessment and plan for additional details.  Past Medical History:  Diagnosis Date   Anemia    Fever 05/31/2021   Headache(784.0)    Preterm labor    Urinary tract infection     Current Outpatient Medications on File Prior to Visit  Medication Sig Dispense Refill   [DISCONTINUED] Norethindrone Acetate-Ethinyl Estrad-FE (LOESTRIN 24 FE) 1-20 MG-MCG(24) tablet Take 1 tablet by mouth 1 day or 1 dose. Start in 3 weeks (Patient not taking: Reported on 08/24/2014) 1 Package 3   No current facility-administered medications on file prior to visit.    Family History  Problem Relation Age of Onset   Cancer Maternal Grandmother    Cancer Maternal Grandfather    Alcohol abuse Neg Hx    Arthritis Neg Hx    Asthma Neg Hx    Birth defects Neg Hx    COPD Neg Hx    Depression Neg Hx    Diabetes Neg Hx    Drug abuse Neg Hx    Early death Neg Hx    Hearing loss Neg Hx    Heart disease Neg Hx    Hypertension Neg Hx    Hyperlipidemia Neg Hx    Kidney disease Neg Hx    Learning disabilities Neg Hx    Mental illness Neg Hx    Mental retardation Neg Hx    Miscarriages / Stillbirths Neg Hx    Stroke Neg Hx    Vision loss Neg Hx    Other Neg Hx    Breast cancer Neg Hx     Social History   Socioeconomic History   Marital status: Single    Spouse name: Not on file   Number of children: Not on file   Years of education: Not on file   Highest education level: Not on file  Occupational History   Not on file  Tobacco Use   Smoking status: Every Day    Current packs/day: 0.50    Types: Cigarettes   Smokeless tobacco: Current   Tobacco comments:    .5 PPD  Substance and Sexual Activity   Alcohol use: Yes    Comment: occasionally   Drug use: No   Sexual activity: Yes    Partners: Male    Birth  control/protection: I.U.D.  Other Topics Concern   Not on file  Social History Narrative   Not on file   Social Determinants of Health   Financial Resource Strain: Not on file  Food Insecurity: No Food Insecurity (07/12/2022)   Hunger Vital Sign    Worried About Running Out of Food in the Last Year: Never true    Ran Out of Food in the Last Year: Never true  Transportation Needs: No Transportation Needs (07/12/2022)   PRAPARE - Administrator, Civil Service (Medical): No    Lack of Transportation (Non-Medical): No  Physical Activity: Not on file  Stress: Not on file  Social Connections: Moderately Integrated (07/12/2022)   Social Connection and Isolation Panel [NHANES]    Frequency of Communication with Friends and Family: More than three times a week    Frequency of Social Gatherings with Friends and Family: Once a week    Attends Religious Services: More than 4 times per year    Active Member of  Clubs or Organizations: No    Attends Banker Meetings: Never    Marital Status: Living with partner  Intimate Partner Violence: Not At Risk (07/12/2022)   Humiliation, Afraid, Rape, and Kick questionnaire    Fear of Current or Ex-Partner: No    Emotionally Abused: No    Physically Abused: No    Sexually Abused: No   Review of Systems: ROS negative except for what is noted on the assessment and plan.  Vitals:   03/22/23 1433  BP: 125/67  Pulse: 90  Temp: 98.3 F (36.8 C)  TempSrc: Oral  SpO2: 99%  Weight: 154 lb 11.2 oz (70.2 kg)  Height: 5\' 6"  (1.676 m)   Physical Exam: Constitutional: well-appearing, sitting in NAD Cardiovascular: regular rate and rhythm, no m/r/g Pulmonary/Chest: CTA bilaterally, normal respiratory rate and effort Abdominal: soft, non-tender, non-distended MSK: normal bulk and tone Neurological: alert & oriented x 3, no focal deficits Skin: warm and dry Psych: normal mood and behavior  Assessment & Plan:   Patient seen with Dr.  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  She says that despite several referrals being sent in the past, she has not received any calls from them to set up an appointment.  I will place a referral to OB/GYN today and instructed her to call us if she is not contacted in the next two weeks so we can make sure she gets an appointment to follow this up.   Health care maintenance Since she is now 89, we will collect a lipid panel today. She hasn't had any labs in a while so we will also collect a CMP.   Opioid use disorder, moderate, in sustained remission (HCC) She continues to use Suboxone 8-2 3 times daily with no complaints.  She denies any recent cravings and has been stable on Suboxone for 4 years.  PDMP was reviewed, use is appropriate.  She denies any side effects of this medication. We have refilled a 3 month supply of her suboxone and have collected a toxassure urine test today. We will follow up again in 3 months.    Annett Fabian, MD  [facility] Internal Medicine, PGY-1 Phone: [phone] Date 03/22/2023 Time 3:35 PM

## 2023-03-22 NOTE — Assessment & Plan Note (Signed)
She continues to use Suboxone 8-2 3 times daily with no complaints.  She denies any recent cravings and has been stable on Suboxone for 4 years.  PDMP was reviewed, use is appropriate.  She denies any side effects of this medication. We have refilled a 3 month supply of her suboxone and have collected a toxassure urine test today. We will follow up again in 3 months.

## 2023-03-22 NOTE — Assessment & Plan Note (Addendum)
Since she is now 89, we will collect a lipid panel today. She hasn't had any labs in a while so we will also collect a CMP.

## 2023-03-22 NOTE — Patient Instructions (Signed)
Thank you, Tonya David for allowing [place] to provide your care today. Today we discussed your suboxone and general health.  We have sent in a 34-month refill of your Suboxone today.  We have also placed a referral to obstetrics and gynecology for follow-up of your abnormal Pap smear.  They should be contacting you in the next 2 weeks to set up an appointment.  If they do not contact you in the next 2 weeks, please give [place] a call, so we can work with you to ensure that an appointment is scheduled to follow this up.  I have ordered the following labs for you:   Lab Orders         Lipid Profile         CMP14 + Anion Gap      Referrals ordered today:    Referral Orders         Ambulatory referral to Obstetrics / Gynecology      I have refilled the following medications:   Meds ordered this encounter  Medications   Buprenorphine HCl-Naloxone HCl (SUBOXONE) 8-2 MG FILM    Sig: Place 1 Film under the tongue in the morning, at noon, and at bedtime.    Dispense:  90 Film    Refill:  2    Follow up: 3 months   We look forward to seeing you next time. Please call our clinic at [phone] if you have any questions or concerns. The best time to call is Monday-Friday from 9am-4pm, but there is someone available 24/7. If after hours or the weekend, call the main hospital number and ask for the Internal Medicine Resident On-Call. If you need medication refills, please notify your pharmacy one week in advance and they will send [place] a request.   Thank you for trusting me with your care. Wishing you the best!   Annett Fabian, MD [facility]

## 2023-03-22 NOTE — Addendum Note (Signed)
Addended by: Annett Fabian on: 03/22/2023 04:05 PM   Modules accepted: Orders

## 2023-03-23 LAB — CMP14 + ANION GAP
ALT: 8 IU/L (ref 0–32)
AST: 15 IU/L (ref 0–40)
Albumin: 4.4 g/dL (ref 3.9–4.9)
Alkaline Phosphatase: 69 IU/L (ref 44–121)
Anion Gap: 15 mmol/L (ref 10.0–18.0)
BUN/Creatinine Ratio: 9 (ref 9–23)
BUN: 6 mg/dL (ref 6–20)
Bilirubin Total: <0.2 mg/dL (ref 0.0–1.2)
CO2: 19 mmol/L — ABNORMAL LOW (ref 20–29)
Calcium: 9.3 mg/dL (ref 8.7–10.2)
Chloride: 106 mmol/L (ref 96–106)
Creatinine, Ser: 0.65 mg/dL (ref 0.57–1.00)
Globulin, Total: 2.5 g/dL (ref 1.5–4.5)
Glucose: 98 mg/dL (ref 70–99)
Potassium: 4.1 mmol/L (ref 3.5–5.2)
Sodium: 140 mmol/L (ref 134–144)
Total Protein: 6.9 g/dL (ref 6.0–8.5)
eGFR: 118 mL/min/{1.73_m2} (ref >59)

## 2023-03-23 LAB — LIPID PANEL
Chol/HDL Ratio: 4 ratio (ref 0.0–4.4)
Cholesterol, Total: 170 mg/dL (ref 100–199)
HDL: 43 mg/dL (ref >39)
LDL Chol Calc (NIH): 101 mg/dL — ABNORMAL HIGH (ref 0–99)
Triglycerides: 149 mg/dL (ref 0–149)
VLDL Cholesterol Cal: 26 mg/dL (ref 5–40)

## 2023-03-23 NOTE — Progress Notes (Signed)
Internal Medicine Clinic Attending  I was physically present during the key portions of the resident provided service and participated in the medical decision making of patient's management care. I reviewed pertinent patient test results.  The assessment, diagnosis, and plan were formulated together and I agree with the documentation in the resident's note.  Erlinda Hong, MD FACP

## 2023-04-23 ENCOUNTER — Other Ambulatory Visit: Payer: Self-pay

## 2023-04-23 DIAGNOSIS — F1121 Opioid dependence, in remission: Secondary | ICD-10-CM

## 2023-04-23 NOTE — Telephone Encounter (Signed)
Last rx written - 03/22/23. Last OV - 03/22/23. Next OV - has not been scheduled. TOX - 03/22/23.

## 2023-04-23 NOTE — Telephone Encounter (Signed)
  Buprenorphine HCl-Naloxone HCl (SUBOXONE) 8-2 MG FILM   Pharmacy  [facility] [phone] - [place], [place] - [address]

## 2023-04-24 ENCOUNTER — Other Ambulatory Visit: Payer: Self-pay | Admitting: Student

## 2023-05-28 ENCOUNTER — Telehealth: Payer: Self-pay | Admitting: Family Medicine

## 2023-05-28 NOTE — Telephone Encounter (Signed)
Patient called in for a message that was left on her phone, Informed patient our next available was for 07/12/23. Patient said that is too far away.

## 2023-05-29 NOTE — Telephone Encounter (Signed)
Appt schedule 06/18/23 with Dr Versie Starks.

## 2023-06-01 NOTE — Telephone Encounter (Signed)
Left message for a call back regarding appt for colpo. Referral sent in January 2024, patient was scheduled 11/14/22 and was a new show.Second referral placed in September 2024 and is now closed per patient refusal. Patient will need new referral referral from her PCP for an abnormal pap.  Danne Harbor RMA

## 2023-06-18 ENCOUNTER — Encounter: Payer: Medicaid Other | Admitting: Student

## 2023-07-18 ENCOUNTER — Other Ambulatory Visit: Payer: Self-pay | Admitting: Internal Medicine

## 2023-07-18 DIAGNOSIS — F1121 Opioid dependence, in remission: Secondary | ICD-10-CM

## 2023-07-18 NOTE — Telephone Encounter (Signed)
 Call from patient stating she is in need of a refill fro her Suboxone .  Patient states has 3-4 days left.  We were able to schedule patient for an appointment  for 07/27/2023 at 9:15 AM.  Patient wanted to know if she could get a refill until that time.  Encouraged to keep the appointment on 07/27/2023 ant to call if she is unable to come in.

## 2023-07-20 NOTE — Telephone Encounter (Signed)
 Pt is calling about refill on Suboxone; she had called 2 days ago. Requesting refill today prior to the winter weather.

## 2023-07-27 ENCOUNTER — Other Ambulatory Visit: Payer: Self-pay

## 2023-07-27 ENCOUNTER — Encounter: Payer: Self-pay | Admitting: Student

## 2023-07-27 ENCOUNTER — Ambulatory Visit (INDEPENDENT_AMBULATORY_CARE_PROVIDER_SITE_OTHER): Payer: Medicaid Other | Admitting: Student

## 2023-07-27 ENCOUNTER — Other Ambulatory Visit (HOSPITAL_COMMUNITY)
Admission: RE | Admit: 2023-07-27 | Discharge: 2023-07-27 | Disposition: A | Discharge status: Home / Self Care | Payer: Medicaid Other | Source: Ambulatory Visit | Attending: Student in an Organized Health Care Education/Training Program | Admitting: Student in an Organized Health Care Education/Training Program

## 2023-07-27 VITALS — BP 136/77 | HR 90 | Temp 98.3°F | Ht 66.0 in | Wt 151.2 lb

## 2023-07-27 DIAGNOSIS — B9689 Other specified bacterial agents as the cause of diseases classified elsewhere: Secondary | ICD-10-CM | POA: Diagnosis present

## 2023-07-27 DIAGNOSIS — F1121 Opioid dependence, in remission: Secondary | ICD-10-CM | POA: Diagnosis not present

## 2023-07-27 DIAGNOSIS — Z113 Encounter for screening for infections with a predominantly sexual mode of transmission: Secondary | ICD-10-CM | POA: Insufficient documentation

## 2023-07-27 DIAGNOSIS — K5903 Drug induced constipation: Secondary | ICD-10-CM | POA: Diagnosis not present

## 2023-07-27 DIAGNOSIS — N760 Acute vaginitis: Secondary | ICD-10-CM | POA: Diagnosis present

## 2023-07-27 DIAGNOSIS — R87610 Atypical squamous cells of undetermined significance on cytologic smear of cervix (ASC-US): Secondary | ICD-10-CM | POA: Diagnosis not present

## 2023-07-27 MED ORDER — POLYETHYLENE GLYCOL 3350 17 G PO PACK: 17.0000 g | PACK | Freq: Every day | ORAL | 0 refills | Status: AC

## 2023-07-27 MED ORDER — SENNOSIDES-DOCUSATE SODIUM 8.6-50 MG PO TABS: 1.0000 | ORAL_TABLET | Freq: Every day | ORAL | 3 refills | Status: AC

## 2023-07-27 NOTE — Assessment & Plan Note (Signed)
Doing well with her opioid use disorder, denies any relapse or issues.  Prescriptions recently filled, last tox assure appropriate as well as PDMP.  Patient does endorse some ongoing constipation today and they use the bathroom every 3 to 4 days.   Plan: Defer tox assure panel today in setting of consistently inappropriate tox assure as in PDMP is in the past. Senna docusate, MiraLAX Discussed good bowel habits.

## 2023-07-27 NOTE — Assessment & Plan Note (Signed)
Patient endorses some ongoing vaginal discharge since last visit, states it is white and watery.  Endorses some odor, but otherwise denies dysuria, burning with urination, itching, rash, or pain.  Most consistent with bacterial vaginitis. Plan: Will obtain self swab for BV, trichomoniasis, Candida, GC today. Will follow results with treatment as indicated.

## 2023-07-27 NOTE — Patient Instructions (Signed)
Thank you, [name] for allowing [place] to provide your care today.  I have ordered the following tests for you:   Lab Orders         HIV antibody (with reflex)         RPR     Self swab for BV    I have ordered the following medication/changed the following medications:    Start the following medications: Meds ordered this encounter  Medications   senna-docusate (SENOKOT-S) 8.6-50 MG tablet    Sig: Take 1 tablet by mouth daily.    Dispense:  30 tablet    Refill:  3   polyethylene glycol (MIRALAX) 17 g packet    Sig: Take 17 g by mouth daily.    Dispense:  14 each    Refill:  0      Follow up: 3 months for routine    We look forward to seeing you next time. Please call our clinic at [phone] if you have any questions or concerns. The best time to call is Monday-Friday from 9am-4pm, but there is someone available 24/7. If after hours or the weekend, call the main hospital number and ask for the Internal Medicine Resident On-Call. If you need medication refills, please notify your pharmacy one week in advance and they will send [place] a request.   Thank you for trusting me with your care. Wishing you the best!  Lovie Macadamia MD [facility]

## 2023-07-27 NOTE — Progress Notes (Signed)
    Subjective:  CC: OUD follow-up, vaginal discharge  HPI:  [name] is a 38 y.o. person with a past medical history stated below and presents today for the stated chief complaint. Please see problem based assessment and plan for additional details.  Past Medical History:  Diagnosis Date   Anemia    Fever 05/31/2021   Headache(784.0)    Preterm labor    Urinary tract infection     Current Outpatient Medications on File Prior to Visit  Medication Sig Dispense Refill   Buprenorphine HCl-Naloxone HCl (SUBOXONE) 8-2 MG FILM Place 1 Film under the tongue 3 (three) times daily. 90 Film 0   [DISCONTINUED] Norethindrone Acetate-Ethinyl Estrad-FE (LOESTRIN 24 FE) 1-20 MG-MCG(24) tablet Take 1 tablet by mouth 1 day or 1 dose. Start in 3 weeks (Patient not taking: Reported on 08/24/2014) 1 Package 3   No current facility-administered medications on file prior to visit.    Review of Systems: Please see assessment and plan for pertinent positives and negatives.  Objective:   Vitals:   07/27/23 0917  BP: 136/77  Pulse: 90  Temp: 98.3 F (36.8 C)  TempSrc: Oral  SpO2: 99%  Weight: 151 lb 3.2 oz (68.6 kg)  Height: 5\' 6"  (1.676 m)    Physical Exam: Constitutional: Well-appearing Cardiovascular: Regular rate and rhythm no rubs gallops or murmurs Pulmonary/Chest: lungs clear to auscultation bilaterally Abdominal: soft, non-tender, non-distended Extremities: No edema of the lower extremities bilaterally Psych: Pleasant affect Thought process is linear and is goal-directed.     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  Denies GU symptoms such as bleeding, postcoital bleeding, or GU pain.  She was able to get in contact with OB/GYN but no appointments until March. Plan: Encouraged follow-up with OB/GYN in March, contact information  given.   Bacterial vaginosis Patient endorses some ongoing vaginal discharge since last visit, states it is white and watery.  Endorses some odor, but otherwise denies dysuria, burning with urination, itching, rash, or pain.  Most consistent with bacterial vaginitis. Plan: Will obtain self swab for BV, trichomoniasis, Candida, GC today. Will follow results with treatment as indicated.   Routine screening for STI (sexually transmitted infection) Patient requesting STI testing.  3 sexual partners in the last year, protection with 1 partner.  Patient has IUD in place. Plan: Trichomoniasis, gonorrhea, chlamydia, syphilis, HIV testing today.   Opioid use disorder, moderate, in sustained remission (HCC) Doing well with her opioid use disorder, denies any relapse or issues.  Prescriptions recently filled, last tox assure appropriate as well as PDMP.  Patient does endorse some ongoing constipation today and they use the bathroom every 3 to 4 days.   Plan: Defer tox assure panel today in setting of consistently inappropriate tox assure as in PDMP is in the past. Senna docusate, MiraLAX Discussed good bowel habits.    Patient discussed with Dr. Willow Ora MD [facility] Internal [facility]  PGY-1 Pager: [phone]  Phone: [phone] Date 07/27/2023  Time 5:18 PM

## 2023-07-27 NOTE — Assessment & Plan Note (Signed)
History of ASCUS with a positive HPV last January.  Denies GU symptoms such as bleeding, postcoital bleeding, or GU pain.  She was able to get in contact with OB/GYN but no appointments until March. Plan: Encouraged follow-up with OB/GYN in March, contact information given.

## 2023-07-27 NOTE — Assessment & Plan Note (Signed)
Patient requesting STI testing.  3 sexual partners in the last year, protection with 1 partner.  Patient has IUD in place. Plan: Trichomoniasis, gonorrhea, chlamydia, syphilis, HIV testing today.

## 2023-07-28 LAB — RPR: RPR Ser Ql: NONREACTIVE

## 2023-07-28 LAB — HIV ANTIBODY (ROUTINE TESTING W REFLEX): HIV Screen 4th Generation wRfx: NONREACTIVE

## 2023-07-30 LAB — CERVICOVAGINAL ANCILLARY ONLY
Bacterial Vaginitis (gardnerella): POSITIVE — AB
Candida Glabrata: NEGATIVE
Candida Vaginitis: NEGATIVE
Chlamydia: NEGATIVE
Comment: NEGATIVE
Comment: NEGATIVE
Comment: NEGATIVE
Comment: NEGATIVE
Comment: NEGATIVE
Comment: NORMAL
Neisseria Gonorrhea: NEGATIVE
Trichomonas: NEGATIVE

## 2023-07-31 ENCOUNTER — Other Ambulatory Visit: Payer: Self-pay | Admitting: Student

## 2023-07-31 DIAGNOSIS — B9689 Other specified bacterial agents as the cause of diseases classified elsewhere: Secondary | ICD-10-CM

## 2023-07-31 DIAGNOSIS — R87610 Atypical squamous cells of undetermined significance on cytologic smear of cervix (ASC-US): Secondary | ICD-10-CM

## 2023-07-31 MED ORDER — METRONIDAZOLE 500 MG PO TABS: 500.0000 mg | ORAL_TABLET | Freq: Two times a day (BID) | ORAL | 0 refills | Status: AC

## 2023-07-31 NOTE — Progress Notes (Signed)
I spoke with Tonya David on the phone. Patient's identity was confirmed using two patient specific identifiers. We discussed BV diagnosis, treatment.  Patient states she tried to get in with her OB/GYN for her history of ASCUS and positive HPV and was told they are not accepting new patients.  I will place a new referral.

## 2023-08-02 NOTE — Progress Notes (Signed)
 Internal Medicine Clinic Attending  Case discussed with the resident at the time of the visit.  We reviewed the resident's history and exam and pertinent patient test results.  I agree with the assessment, diagnosis, and plan of care documented in the resident's note.

## 2023-08-16 ENCOUNTER — Other Ambulatory Visit: Payer: Self-pay

## 2023-08-16 DIAGNOSIS — F1121 Opioid dependence, in remission: Secondary | ICD-10-CM

## 2023-08-17 ENCOUNTER — Other Ambulatory Visit: Payer: Self-pay | Admitting: Internal Medicine

## 2023-08-17 DIAGNOSIS — F1121 Opioid dependence, in remission: Secondary | ICD-10-CM

## 2023-08-18 ENCOUNTER — Other Ambulatory Visit: Payer: Self-pay | Admitting: Student in an Organized Health Care Education/Training Program

## 2023-08-18 DIAGNOSIS — F1121 Opioid dependence, in remission: Secondary | ICD-10-CM

## 2023-08-18 MED ORDER — BUPRENORPHINE HCL-NALOXONE HCL 8-2 MG SL FILM: 1.0000 | ORAL_FILM | Freq: Three times a day (TID) | SUBLINGUAL | 0 refills | Status: DC

## 2023-08-20 NOTE — Telephone Encounter (Signed)
 Duplicate request. Has already been addressed.

## 2023-08-20 NOTE — Telephone Encounter (Signed)
 Rx refill request has already been taken care of by pcp on 08/18/23.

## 2023-09-10 ENCOUNTER — Encounter: Payer: Self-pay | Admitting: Obstetrics & Gynecology

## 2023-09-10 ENCOUNTER — Ambulatory Visit: Payer: Medicaid Other | Admitting: Obstetrics & Gynecology

## 2023-09-10 ENCOUNTER — Other Ambulatory Visit (HOSPITAL_COMMUNITY)
Admission: RE | Admit: 2023-09-10 | Discharge: 2023-09-10 | Disposition: A | Discharge status: Home / Self Care | Source: Ambulatory Visit | Attending: Obstetrics & Gynecology | Admitting: Obstetrics & Gynecology

## 2023-09-10 VITALS — BP 131/84 | HR 84 | Wt 158.0 lb

## 2023-09-10 DIAGNOSIS — R87610 Atypical squamous cells of undetermined significance on cytologic smear of cervix (ASC-US): Secondary | ICD-10-CM | POA: Insufficient documentation

## 2023-09-10 DIAGNOSIS — N920 Excessive and frequent menstruation with regular cycle: Secondary | ICD-10-CM

## 2023-09-10 DIAGNOSIS — R87810 Cervical high risk human papillomavirus (HPV) DNA test positive: Secondary | ICD-10-CM | POA: Insufficient documentation

## 2023-09-10 DIAGNOSIS — Z23 Encounter for immunization: Secondary | ICD-10-CM

## 2023-09-10 LAB — POCT URINE PREGNANCY: Preg Test, Ur: NEGATIVE

## 2023-09-10 NOTE — Progress Notes (Signed)
 Pt is in office for colposcopy.  Pt has questions about IUD - pt has copper IUD in place.  Pt states cycles are long, approx 2 weeks and heavy.

## 2023-09-10 NOTE — Progress Notes (Signed)
    GYNECOLOGY PROGRESS NOTE  Subjective:    Patient ID: Tonya David, female    DOB: 12-23-1985, 38 y.o.   MRN: [id number]  HPI  Patient is a 38 y.o. [id number] here for a colpo due to pap 07/2022 showing ASCUS + HR HPV (not 16 or 18). She smokes about 1/2 ppd, trying to quit. She reports a h/o ASCUS during a pregnancy in 2017. She reports having a colpo but no follow up treatment.  She has a Copper T IUD and reports heavy periods that last 2 weeks every month. She is interested in changing to Mirena IUD>  The following portions of the patient's history were reviewed and updated as appropriate: allergies, current medications, past family history, past medical history, past social history, past surgical history, and problem list.  Review of Systems Pertinent items are noted in HPI.   Objective:   Blood pressure 131/84, pulse 84, weight 158 lb (71.7 kg), last menstrual period 08/30/2023, unknown if currently breastfeeding. Body mass index is 25.5 kg/m. Well nourished, well hydrated Black female, no apparent distress She is ambulating and conversing normally. UPT negative, consent signed, time out done Speculum placed. Cervix prepped with acetic acid. Transformation zone seen in its entirety. Colpo adequate. Colposcopic findings normal. ECC obtained. She tolerated the procedure well.    Assessment:   1. ASCUS with positive high risk HPV cervical     2.     Menorrhagia with Copper T IUD  Plan:   1. ASCUS with positive high risk HPV cervical (Primary)  - Surgical pathology( [place]/ [facility])    2. When she returns for Gardasil #2 in 2 months, she may have Copper T replaced with Mirena.  3.  We discussed tobacco and the increased risk of cervical cancer.

## 2023-09-11 ENCOUNTER — Other Ambulatory Visit: Payer: Self-pay | Admitting: Student

## 2023-09-11 DIAGNOSIS — F1121 Opioid dependence, in remission: Secondary | ICD-10-CM

## 2023-09-11 NOTE — Telephone Encounter (Signed)
 Last rx written - 08/18/23. Last OV - 07/27/23. Next OV - has not been scheduled. TOX - 03/22/23.

## 2023-09-11 NOTE — Telephone Encounter (Signed)
Buprenorphine HCl-Naloxone HCl (SUBOXONE) 8-2 MG FILM   [id number] - [place], [place] - [address]

## 2023-09-12 MED ORDER — BUPRENORPHINE HCL-NALOXONE HCL 8-2 MG SL FILM: 1.0000 | ORAL_FILM | Freq: Three times a day (TID) | SUBLINGUAL | 0 refills | Status: DC

## 2023-09-13 LAB — SURGICAL PATHOLOGY

## 2023-09-17 ENCOUNTER — Encounter: Payer: Self-pay | Admitting: Obstetrics & Gynecology

## 2023-09-17 ENCOUNTER — Telehealth: Admitting: Physician Assistant

## 2023-09-17 DIAGNOSIS — B9689 Other specified bacterial agents as the cause of diseases classified elsewhere: Secondary | ICD-10-CM

## 2023-09-17 DIAGNOSIS — J069 Acute upper respiratory infection, unspecified: Secondary | ICD-10-CM | POA: Diagnosis not present

## 2023-09-17 MED ORDER — ALBUTEROL SULFATE HFA 108 (90 BASE) MCG/ACT IN AERS: 1.0000 | INHALATION_SPRAY | Freq: Four times a day (QID) | RESPIRATORY_TRACT | 0 refills | Status: DC | PRN

## 2023-09-17 MED ORDER — PROMETHAZINE-DM 6.25-15 MG/5ML PO SYRP: 5.0000 mL | ORAL_SOLUTION | Freq: Four times a day (QID) | ORAL | 0 refills | Status: DC | PRN

## 2023-09-17 MED ORDER — AMOXICILLIN-POT CLAVULANATE 875-125 MG PO TABS: 1.0000 | ORAL_TABLET | Freq: Two times a day (BID) | ORAL | 0 refills | Status: DC

## 2023-09-17 MED ORDER — FLUTICASONE PROPIONATE 50 MCG/ACT NA SUSP: 2.0000 | Freq: Every day | NASAL | 0 refills | Status: DC

## 2023-09-17 NOTE — Progress Notes (Signed)
 Virtual Visit Consent   Tonya David, you are scheduled for a virtual visit with a [facility] provider today. Just as with appointments in the office, your consent must be obtained to participate. Your consent will be active for this visit and any virtual visit you may have with one of our providers in the next 365 days. If you have a MyChart account, a copy of this consent can be sent to you electronically.  As this is a virtual visit, video technology does not allow for your provider to perform a traditional examination. This may limit your provider's ability to fully assess your condition. If your provider identifies any concerns that need to be evaluated in person or the need to arrange testing (such as labs, EKG, etc.), we will make arrangements to do so. Although advances in technology are sophisticated, we cannot ensure that it will always work on either your end or our end. If the connection with a video visit is poor, the visit may have to be switched to a telephone visit. With either a video or telephone visit, we are not always able to ensure that we have a secure connection.  By engaging in this virtual visit, you consent to the provision of healthcare and authorize for your insurance to be billed (if applicable) for the services provided during this visit. Depending on your insurance coverage, you may receive a charge related to this service.  I need to obtain your verbal consent now. Are you willing to proceed with your visit today? COURTNEY BELLIZZI has provided verbal consent on 09/17/2023 for a virtual visit (video or telephone). Margaretann Loveless, PA-C  Date: 09/17/2023 12:19 PM   Virtual Visit via Video Note   I, Margaretann Loveless, connected with  TAMAIRA David  ([id number], 01-04-1986) on 09/17/23 at 12:15 PM EDT by a video-enabled telemedicine application and verified that I am speaking with the correct person using two identifiers.  Location: Patient: Virtual Visit  Location Patient: Home Provider: Virtual Visit Location Provider: Home Office   I discussed the limitations of evaluation and management by telemedicine and the availability of in person appointments. The patient expressed understanding and agreed to proceed.    History of Present Illness: Tonya David is a 38 y.o. who identifies as a female who was assigned female at birth, and is being seen today for URI symptoms.  HPI: URI  This is a new problem. The current episode started 1 to 4 weeks ago (over a week). The problem has been gradually worsening. There has been no fever. Associated symptoms include chest pain (tightness), congestion, coughing (mostly dry), headaches, a plugged ear sensation (bilateral), rhinorrhea, sinus pain, a sore throat and wheezing. Pertinent negatives include no diarrhea, ear pain, nausea or vomiting. Treatments tried: theraflu, robitussin, benadryl, cough drops, tylenol, ibuprofen. The treatment provided no relief.     Problems:  Patient Active Problem List   Diagnosis Date Noted   Bacterial vaginosis 07/27/2023   Routine screening for STI (sexually transmitted infection) 07/27/2023   Health care maintenance 07/14/2022   IUD (intrauterine device) in place 01/18/2022   Opioid use disorder, moderate, in sustained remission (HCC) 08/13/2018   Atypical squamous cells of undetermined significance on cytologic smear of cervix (ASC-US) 08/02/2016   History of preterm delivery 03/14/2012    Allergies: No Known Allergies Medications:  Current Outpatient Medications:    albuterol (VENTOLIN HFA) 108 (90 Base) MCG/ACT inhaler, Inhale 1-2 puffs into the lungs every 6 (six) hours  as needed., Disp: 8 g, Rfl: 0   amoxicillin-clavulanate (AUGMENTIN) 875-125 MG tablet, Take 1 tablet by mouth 2 (two) times daily., Disp: 20 tablet, Rfl: 0   fluticasone (FLONASE) 50 MCG/ACT nasal spray, Place 2 sprays into both nostrils daily., Disp: 16 g, Rfl: 0    promethazine-dextromethorphan (PROMETHAZINE-DM) 6.25-15 MG/5ML syrup, Take 5 mLs by mouth 4 (four) times daily as needed., Disp: 118 mL, Rfl: 0   Buprenorphine HCl-Naloxone HCl (SUBOXONE) 8-2 MG FILM, Place 1 Film under the tongue 3 (three) times daily., Disp: 90 Film, Rfl: 0   polyethylene glycol (MIRALAX) 17 g packet, Take 17 g by mouth daily., Disp: 14 each, Rfl: 0   senna-docusate (SENOKOT-S) 8.6-50 MG tablet, Take 1 tablet by mouth daily., Disp: 30 tablet, Rfl: 3  Observations/Objective: Patient is well-developed, well-nourished in no acute distress.  Resting comfortably at home.  Head is normocephalic, atraumatic.  No labored breathing.  Speech is clear and coherent with logical content.  Patient is alert and oriented at baseline.  Wet cough heard frequently  Assessment and Plan: 1. Bacterial upper respiratory infection (Primary) - amoxicillin-clavulanate (AUGMENTIN) 875-125 MG tablet; Take 1 tablet by mouth 2 (two) times daily.  Dispense: 20 tablet; Refill: 0 - promethazine-dextromethorphan (PROMETHAZINE-DM) 6.25-15 MG/5ML syrup; Take 5 mLs by mouth 4 (four) times daily as needed.  Dispense: 118 mL; Refill: 0 - albuterol (VENTOLIN HFA) 108 (90 Base) MCG/ACT inhaler; Inhale 1-2 puffs into the lungs every 6 (six) hours as needed.  Dispense: 8 g; Refill: 0 - fluticasone (FLONASE) 50 MCG/ACT nasal spray; Place 2 sprays into both nostrils daily.  Dispense: 16 g; Refill: 0  - Worsening symptoms that have not responded to OTC medications.  - Will give Augmentin - Promethazine DM for cough - Flonase for nasal/sinus congestion and drainage - Albuterol for wheezing and chest tightness - Continue allergy medications.  - Steam and humidifier can help - Stay well hydrated and get plenty of rest.  - Seek in person evaluation if no symptom improvement or if symptoms worsen   Follow Up Instructions: I discussed the assessment and treatment plan with the patient. The patient was provided an  opportunity to ask questions and all were answered. The patient agreed with the plan and demonstrated an understanding of the instructions.  A copy of instructions were sent to the patient via MyChart unless otherwise noted below.    The patient was advised to call back or seek an in-person evaluation if the symptoms worsen or if the condition fails to improve as anticipated.    Margaretann Loveless, PA-C

## 2023-09-17 NOTE — Patient Instructions (Signed)
 Tonya David, thank you for joining Margaretann Loveless, PA-C for today's virtual visit.  While this provider is not your primary care provider (PCP), if your PCP is located in our provider database this encounter information will be shared with them immediately following your visit.   A [facility] MyChart account gives you access to today's visit and all your visits, tests, and labs performed at [facility] " click here if you don't have a [facility] MyChart account or go to [email or web address]  Consent: (Patient) Tonya David provided verbal consent for this virtual visit at the beginning of the encounter.  Current Medications:  Current Outpatient Medications:    albuterol (VENTOLIN HFA) 108 (90 Base) MCG/ACT inhaler, Inhale 1-2 puffs into the lungs every 6 (six) hours as needed., Disp: 8 g, Rfl: 0   amoxicillin-clavulanate (AUGMENTIN) 875-125 MG tablet, Take 1 tablet by mouth 2 (two) times daily., Disp: 20 tablet, Rfl: 0   fluticasone (FLONASE) 50 MCG/ACT nasal spray, Place 2 sprays into both nostrils daily., Disp: 16 g, Rfl: 0   promethazine-dextromethorphan (PROMETHAZINE-DM) 6.25-15 MG/5ML syrup, Take 5 mLs by mouth 4 (four) times daily as needed., Disp: 118 mL, Rfl: 0   Buprenorphine HCl-Naloxone HCl (SUBOXONE) 8-2 MG FILM, Place 1 Film under the tongue 3 (three) times daily., Disp: 90 Film, Rfl: 0   polyethylene glycol (MIRALAX) 17 g packet, Take 17 g by mouth daily., Disp: 14 each, Rfl: 0   senna-docusate (SENOKOT-S) 8.6-50 MG tablet, Take 1 tablet by mouth daily., Disp: 30 tablet, Rfl: 3   Medications ordered in this encounter:  Meds ordered this encounter  Medications   amoxicillin-clavulanate (AUGMENTIN) 875-125 MG tablet    Sig: Take 1 tablet by mouth 2 (two) times daily.    Dispense:  20 tablet    Refill:  0    Supervising Provider:   Merrilee Jansky [[id number]]   promethazine-dextromethorphan (PROMETHAZINE-DM) 6.25-15 MG/5ML syrup    Sig: Take  5 mLs by mouth 4 (four) times daily as needed.    Dispense:  118 mL    Refill:  0    Supervising Provider:   Merrilee Jansky [[id number]]   albuterol (VENTOLIN HFA) 108 (90 Base) MCG/ACT inhaler    Sig: Inhale 1-2 puffs into the lungs every 6 (six) hours as needed.    Dispense:  8 g    Refill:  0    Supervising Provider:   Merrilee Jansky [[id number]]   fluticasone (FLONASE) 50 MCG/ACT nasal spray    Sig: Place 2 sprays into both nostrils daily.    Dispense:  16 g    Refill:  0    Supervising Provider:   Merrilee Jansky [[id number]]     *If you need refills on other medications prior to your next appointment, please contact your pharmacy*  Follow-Up: Call back or seek an in-person evaluation if the symptoms worsen or if the condition fails to improve as anticipated.   Virtual Care [phone]  Other Instructions Upper Respiratory Infection, Adult An upper respiratory infection (URI) is a common viral infection of the nose, throat, and upper air passages that lead to the lungs. The most common type of URI is the common cold. URIs usually get better on their own, without medical treatment. What are the causes? A URI is caused by a virus. You may catch a virus by: Breathing in droplets from an infected person's cough or sneeze. Touching something that has been exposed to the virus (is  contaminated) and then touching your mouth, nose, or eyes. What increases the risk? You are more likely to get a URI if: You are very young or very old. You have close contact with others, such as at work, school, or a health care facility. You smoke. You have long-term (chronic) heart or lung disease. You have a weakened disease-fighting system (immune system). You have nasal allergies or asthma. You are experiencing a lot of stress. You have poor nutrition. What are the signs or symptoms? A URI usually involves some of the following symptoms: Runny or stuffy (congested)  nose. Cough. Sneezing. Sore throat. Headache. Fatigue. Fever. Loss of appetite. Pain in your forehead, behind your eyes, and over your cheekbones (sinus pain). Muscle aches. Redness or irritation of the eyes. Pressure in the ears or face. How is this diagnosed? This condition may be diagnosed based on your medical history and symptoms, and a physical exam. Your health care provider may use a swab to take a mucus sample from your nose (nasal swab). This sample can be tested to determine what virus is causing the illness. How is this treated? URIs usually get better on their own within 7-10 days. Medicines cannot cure URIs, but your health care provider may recommend certain medicines to help relieve symptoms, such as: Over-the-counter cold medicines. Cough suppressants. Coughing is a type of defense against infection that helps to clear the respiratory system, so take these medicines only as recommended by your health care provider. Fever-reducing medicines. Follow these instructions at home: Activity Rest as needed. If you have a fever, stay home from work or school until your fever is gone or until your health care provider says your URI cannot spread to other people (is no longer contagious). Your health care provider may have you wear a face mask to prevent your infection from spreading. Relieving symptoms Gargle with a mixture of salt and water 3-4 times a day or as needed. To make salt water, completely dissolve -1 tsp (3-6 g) of salt in 1 cup (237 mL) of warm water. Use a cool-mist humidifier to add moisture to the air. This can help you breathe more easily. Eating and drinking  Drink enough fluid to keep your urine pale yellow. Eat soups and other clear broths. General instructions  Take over-the-counter and prescription medicines only as told by your health care provider. These include cold medicines, fever reducers, and cough suppressants. Do not use any products that  contain nicotine or tobacco. These products include cigarettes, chewing tobacco, and vaping devices, such as e-cigarettes. If you need help quitting, ask your health care provider. Stay away from secondhand smoke. Stay up to date on all immunizations, including the yearly (annual) flu vaccine. Keep all follow-up visits. This is important. How to prevent the spread of infection to others URIs can be contagious. To prevent the infection from spreading: Wash your hands with soap and water for at least 20 seconds. If soap and water are not available, use hand sanitizer. Avoid touching your mouth, face, eyes, or nose. Cough or sneeze into a tissue or your sleeve or elbow instead of into your hand or into the air.  Contact a health care provider if: You are getting worse instead of better. You have a fever or chills. Your mucus is brown or red. You have yellow or brown discharge coming from your nose. You have pain in your face, especially when you bend forward. You have swollen neck glands. You have pain while swallowing. You have  white areas in the back of your throat. Get help right away if: You have shortness of breath that gets worse. You have severe or persistent: Headache. Ear pain. Sinus pain. Chest pain. You have chronic lung disease along with any of the following: Making high-pitched whistling sounds when you breathe, most often when you breathe out (wheezing). Prolonged cough (more than 14 days). Coughing up blood. A change in your usual mucus. You have a stiff neck. You have changes in your: Vision. Hearing. Thinking. Mood. These symptoms may be an emergency. Get help right away. Call 911. Do not wait to see if the symptoms will go away. Do not drive yourself to the hospital. Summary An upper respiratory infection (URI) is a common infection of the nose, throat, and upper air passages that lead to the lungs. A URI is caused by a virus. URIs usually get better on  their own within 7-10 days. Medicines cannot cure URIs, but your health care provider may recommend certain medicines to help relieve symptoms. This information is not intended to replace advice given to you by your health care provider. Make sure you discuss any questions you have with your health care provider. Document Revised: 01/26/2021 Document Reviewed: 01/26/2021 Elsevier Patient Education  2024 Elsevier Inc.   If you have been instructed to have an in-person evaluation today at a local Urgent Care facility, please use the link below. It will take you to a list of all of our available [facility], including address, phone number and hours of operation. Please do not delay care.  [facility]  If you or a family member do not have a primary care provider, use the link below to schedule a visit and establish care. When you choose a [facility] primary care physician or advanced practice provider, you gain a long-term partner in health. Find a Primary Care Provider  Learn more about [facility]'s in-office and virtual care options: [facility] - Get Care Now

## 2023-10-15 ENCOUNTER — Telehealth: Payer: Self-pay | Admitting: Student

## 2023-10-15 ENCOUNTER — Other Ambulatory Visit: Payer: Self-pay

## 2023-10-15 DIAGNOSIS — F1121 Opioid dependence, in remission: Secondary | ICD-10-CM

## 2023-10-15 NOTE — Telephone Encounter (Signed)
 Spoke with the patient.  Appt has been sch for the following:  Name: Weda, Baumgarner MRN: [id number]  Date: 10/22/2023 Status: Sch  Time: 9:15 AM Length: 30  Visit Type: OPEN ESTABLISHED [726] Copay: $0.00  Provider: Jeral Pinch, DO      Copied from CRM [id number]. Topic: Appointments - Scheduling Inquiry for Clinic >> Oct 12, 2023  3:45 PM Alvino Blood C wrote: Reason for CRM: Patient would like a call back at [phone]. She is needing an appointment sooner than 12/10/2023 to refill her medication: SUBOXONE. She will be out of the medication over the weekend.

## 2023-10-16 MED ORDER — BUPRENORPHINE HCL-NALOXONE HCL 8-2 MG SL FILM
1.0000 | ORAL_FILM | Freq: Three times a day (TID) | SUBLINGUAL | 0 refills | Status: DC
Start: 2023-10-16 — End: 2023-11-14

## 2023-10-17 ENCOUNTER — Telehealth: Payer: Self-pay | Admitting: *Deleted

## 2023-10-17 ENCOUNTER — Telehealth: Payer: Self-pay

## 2023-10-17 NOTE — Telephone Encounter (Signed)
 Received a fax from the pharmacy for Suboxone, per pharmacy unauthorized prescriber DEA class. Please send a new rx from a different provider.

## 2023-10-17 NOTE — Telephone Encounter (Signed)
 Call to Pharmacy.  Patient picked up medication on yesterday.

## 2023-10-22 ENCOUNTER — Encounter: Admitting: Student

## 2023-10-22 NOTE — Progress Notes (Deleted)
   Established Patient Office Visit  Subjective   Patient ID: Tonya David, female    DOB: [birth year]  Age: 38 y.o. MRN: [id number]  No chief complaint on file.   HPI  Past Medical History:  Diagnosis Date   Anemia    Fever 05/31/2021   Headache(784.0)    Preterm labor    Urinary tract infection       ROS    Objective:     There were no vitals taken for this visit. BP Readings from Last 3 Encounters:  09/10/23 131/84  07/27/23 136/77  03/22/23 125/67   Wt Readings from Last 3 Encounters:  09/10/23 158 lb (71.7 kg)  07/27/23 151 lb 3.2 oz (68.6 kg)  03/22/23 154 lb 11.2 oz (70.2 kg)   SpO2 Readings from Last 3 Encounters:  07/27/23 99%  03/22/23 99%  10/02/22 99%      Physical Exam   No results found for any visits on 10/22/23.  {Labs (Optional):23779}  The ASCVD Risk score (Arnett DK, et al., 2019) failed to calculate for the following reasons:   The 2019 ASCVD risk score is only valid for ages 12 to 17    Assessment & Plan:  OUD  OP medication regimen Suboxone 8-2, TID. PDMP reviewed, appropriate.  - Tox assure today      Problem List Items Addressed This Visit   None   No follow-ups on file.    Lanney Pitts, DO

## 2023-11-05 ENCOUNTER — Ambulatory Visit (INDEPENDENT_AMBULATORY_CARE_PROVIDER_SITE_OTHER): Payer: Self-pay | Admitting: Student

## 2023-11-05 VITALS — BP 135/85 | HR 88 | Temp 98.7°F | Ht 66.0 in | Wt 152.6 lb

## 2023-11-05 DIAGNOSIS — F1121 Opioid dependence, in remission: Secondary | ICD-10-CM | POA: Diagnosis present

## 2023-11-05 DIAGNOSIS — R87610 Atypical squamous cells of undetermined significance on cytologic smear of cervix (ASC-US): Secondary | ICD-10-CM | POA: Diagnosis not present

## 2023-11-05 DIAGNOSIS — Z23 Encounter for immunization: Secondary | ICD-10-CM

## 2023-11-05 NOTE — Assessment & Plan Note (Signed)
 Vaccinated today.

## 2023-11-05 NOTE — Assessment & Plan Note (Signed)
 Presents today for OUD follow up. Last Tox assure on 03/2023 showed Nor/Buprenorphine  and unexpected cyclobenzaprine . PDMP reviewed, appropriate filling history. She reports that she is taking her friends cyclobenzaprine  as needed at night for sleep. I advised her against it. Presently she is on Suboxone  8-2 TID, denies any withdrawal symptoms.  - Continue Suboxone  8-2 TID - Urine Tox screen today

## 2023-11-05 NOTE — Assessment & Plan Note (Signed)
 Reported that she followed with OB/GYN on 09/10/2023 with Gardasil #1 placed, has a follow up this week for Gardasil #2.

## 2023-11-05 NOTE — Progress Notes (Signed)
   Established Patient Office Visit  Subjective   Patient ID: Tonya David, female    DOB: 11/13/1985  Age: 38 y.o. MRN: [id number]  Chief Complaint  Patient presents with   Medication Management    OUD follow up     HPI This is a 38 year old female living with a history stated below and presents today for OUD follow up. Please see problem based assessment and plan for additional details.   Past Medical History:  Diagnosis Date   Anemia    Fever 05/31/2021   Headache(784.0)    Preterm labor    Urinary tract infection     ROS    As per assessment and plan  Objective:     BP 135/85 (BP Location: Right Arm, Patient Position: Sitting, Cuff Size: Normal)   Pulse 88   Temp 98.7 F (37.1 C) (Oral)   Ht 5\' 6"  (1.676 m)   Wt 152 lb 9.6 oz (69.2 kg)   SpO2 100%   BMI 24.63 kg/m  BP Readings from Last 3 Encounters:  11/05/23 135/85  09/10/23 131/84  07/27/23 136/77   Wt Readings from Last 3 Encounters:  11/05/23 152 lb 9.6 oz (69.2 kg)  09/10/23 158 lb (71.7 kg)  07/27/23 151 lb 3.2 oz (68.6 kg)   SpO2 Readings from Last 3 Encounters:  11/05/23 100%  07/27/23 99%  03/22/23 99%      Physical Exam  General: Sitting in chair, no acute distress Cardiovascular: Regular rate Pulmonary: Breathing comfortably, no wheezing or crackles Abdomen: Soft, nontender, nondistended, bowel sounds present MSK: ROM intact, no lower extremity edema   The ASCVD Risk score (Arnett DK, et al., 2019) failed to calculate for the following reasons:   The 2019 ASCVD risk score is only valid for ages 25 to 78    Assessment & Plan:  Discussed with Dr Ancil Balzarine   Problem List Items Addressed This Visit       Genitourinary   Atypical squamous cells of undetermined significance on cytologic smear of cervix (ASC-US )   Reported that she followed with OB/GYN on 09/10/2023 with Gardasil #1 placed, has a follow up this week for Gardasil #2.         Other   Opioid use disorder, moderate,  in sustained remission (HCC) - Primary   Presents today for OUD follow up. Last Tox assure on 03/2023 showed Nor/Buprenorphine  and unexpected cyclobenzaprine . PDMP reviewed, appropriate filling history. She reports that she is taking her friends cyclobenzaprine  as needed at night for sleep. I advised her against it. Presently she is on Suboxone  8-2 TID, denies any withdrawal symptoms.  - Continue Suboxone  8-2 TID - Urine Tox screen today       Relevant Orders   ToxAssure Select,+Antidepr,UR   Need for diphtheria-tetanus-pertussis (Tdap) vaccine   Vaccinated today.       Relevant Orders   Tdap vaccine greater than or equal to 7yo IM (Completed)   Other Visit Diagnoses       Encounter for immunization       Relevant Orders   Tdap vaccine greater than or equal to 7yo IM (Completed)       Return in about 3 months (around 02/04/2024) for OUD .    Lanney Pitts, DO

## 2023-11-05 NOTE — Patient Instructions (Signed)
 Thank you, [name] for allowing us  to provide your care today. Today we discussed:  Continue your medications  Please do not take any other people's medications  I have ordered the following labs for you:  Lab Orders         ToxAssure Select,+Antidepr,UR      Tests ordered today:  Urine Tox   Referrals ordered today:   Referral Orders  No referral(s) requested today     I have ordered the following medication/changed the following medications:   Stop the following medications: Medications Discontinued During This Encounter  Medication Reason   albuterol  (VENTOLIN  HFA) 108 (90 Base) MCG/ACT inhaler    amoxicillin -clavulanate (AUGMENTIN ) 875-125 MG tablet    fluticasone  (FLONASE ) 50 MCG/ACT nasal spray    promethazine -dextromethorphan (PROMETHAZINE -DM) 6.25-15 MG/5ML syrup      Start the following medications: No orders of the defined types were placed in this encounter.    Follow up: 3 months OUD   Remember:   Should you have any questions or concerns please call the internal medicine clinic at [phone].     Tonya Pitts, DO [facility]

## 2023-11-08 LAB — TOXASSURE SELECT,+ANTIDEPR,UR

## 2023-11-09 ENCOUNTER — Ambulatory Visit

## 2023-11-09 NOTE — Progress Notes (Signed)
 Internal Medicine Clinic Attending  Case discussed with the resident at the time of the visit.  We reviewed the resident's history and exam and pertinent patient test results.  I agree with the assessment, diagnosis, and plan of care documented in the resident's note.

## 2023-11-14 ENCOUNTER — Other Ambulatory Visit: Payer: Self-pay

## 2023-11-14 DIAGNOSIS — F1121 Opioid dependence, in remission: Secondary | ICD-10-CM

## 2023-11-14 MED ORDER — BUPRENORPHINE HCL-NALOXONE HCL 8-2 MG SL FILM: 1.0000 | ORAL_FILM | Freq: Three times a day (TID) | SUBLINGUAL | 0 refills | Status: DC

## 2023-11-14 NOTE — Telephone Encounter (Signed)
 Per pharmacy "Per [place] this MD is not allowed to write for suboxone  products, another MD in practice" can someone other than Dr.Justus send in a rx please.

## 2023-11-15 MED ORDER — BUPRENORPHINE HCL-NALOXONE HCL 8-2 MG SL FILM: 1.0000 | ORAL_FILM | Freq: Three times a day (TID) | SUBLINGUAL | 0 refills | Status: DC

## 2023-11-15 NOTE — Telephone Encounter (Signed)
 Copied from CRM [id number]. Topic: Clinical - Prescription Issue >> Nov 14, 2023  4:09 PM Blair Bumpers wrote: Reason for CRM: Patient called and stated that she was told by the pharmacy that Dr. Duncan Gibson' name can't be on her prescription because it's a narcotic and he's not on her Medicaid. Patient states the provider that sent it last time, Dr. Jeanett Miles patient), needs to be the one to re-send the prescription back to the pharmacy. The medication is Buprenorphine  HCl-Naloxone  HCl (SUBOXONE ) 8-2 MG FILM. >> Nov 15, 2023  9:33 AM Retta Caster wrote: Buprenorphine  HCl-Naloxone  HCl (SUBOXONE ) 8-2 MG FILM. Patient calling back on issue with medication. States that Dr. Duncan Gibson show prescribed but [facility] will not fill medication due to it shows on there end Dr. Winston Hawking. Called office for clarification. Request to transfer

## 2023-11-15 NOTE — Telephone Encounter (Signed)
 I called [facility] who stated it does not have to be Dr Hartwell Linea. Send to the Attending to re-send rx.

## 2023-11-15 NOTE — Telephone Encounter (Signed)
 Copied from CRM [id number]. Topic: Clinical - Prescription Issue >> Nov 14, 2023  4:09 PM Blair Bumpers wrote: Reason for CRM: Patient called and stated that she was told by the pharmacy that Dr. Duncan Gibson' name can't be on her prescription because it's a narcotic and he's not on her Medicaid. Patient states the provider that sent it last time, Dr. Jeanett Miles patient), needs to be the one to re-send the prescription back to the pharmacy. The medication is Buprenorphine  HCl-Naloxone  HCl (SUBOXONE ) 8-2 MG FILM.

## 2023-11-15 NOTE — Addendum Note (Signed)
 Addended by: Christain Courser L on: 11/15/2023 10:03 AM   Modules accepted: Orders

## 2023-11-15 NOTE — Telephone Encounter (Signed)
 Sending to [facility] Att[facility] and Dr Winston Hawking.

## 2023-11-15 NOTE — Telephone Encounter (Signed)
Pt was called / informed of refill. 

## 2023-11-16 ENCOUNTER — Encounter: Payer: Self-pay | Admitting: Obstetrics and Gynecology

## 2023-11-16 ENCOUNTER — Ambulatory Visit: Admitting: Obstetrics and Gynecology

## 2023-11-16 VITALS — BP 129/79 | HR 84 | Ht 67.0 in | Wt 152.0 lb

## 2023-11-16 DIAGNOSIS — Z23 Encounter for immunization: Secondary | ICD-10-CM

## 2023-11-16 DIAGNOSIS — Z30433 Encounter for removal and reinsertion of intrauterine contraceptive device: Secondary | ICD-10-CM | POA: Diagnosis not present

## 2023-11-16 DIAGNOSIS — Z975 Presence of (intrauterine) contraceptive device: Secondary | ICD-10-CM

## 2023-11-16 DIAGNOSIS — R87610 Atypical squamous cells of undetermined significance on cytologic smear of cervix (ASC-US): Secondary | ICD-10-CM

## 2023-11-16 DIAGNOSIS — Z30430 Encounter for insertion of intrauterine contraceptive device: Secondary | ICD-10-CM

## 2023-11-16 DIAGNOSIS — Z30432 Encounter for removal of intrauterine contraceptive device: Secondary | ICD-10-CM

## 2023-11-16 MED ORDER — LEVONORGESTREL 20 MCG/DAY IU IUD
1.0000 | INTRAUTERINE_SYSTEM | Freq: Once | INTRAUTERINE | Status: AC
  Administered 2023-11-16: 1 via INTRAUTERINE

## 2023-11-16 NOTE — Addendum Note (Signed)
 Addended by: Whitfield Hamper on: 11/16/2023 10:42 AM   Modules accepted: Orders

## 2023-11-16 NOTE — Progress Notes (Signed)
 Pt switching from Paragard  to Mirena.   Pt received 1st Gardasil 09/10/23

## 2023-11-16 NOTE — Progress Notes (Signed)
 GYNECOLOGY VISIT  Patient name: Tonya David MRN [id number]  Date of birth: 1986-06-02 Chief Complaint:   Contraception  History:  Tonya David is a 38 y.o. [id number] being seen today for lng-IUD insertion. Had prolonged bleeding with Cu-IUD and would like to switch. Cu-IUD inserted in 2018, no issues at time of insertion. Intermittently able to feel the strings.    Seen 09/10/23 for colpo and reported prolonged menses with Cu-IUD and woul dike mireana  Past Medical History:  Diagnosis Date   Anemia    Fever 05/31/2021   Headache(784.0)    Preterm labor    Urinary tract infection     Past Surgical History:  Procedure Laterality Date   THERAPEUTIC ABORTION      The following portions of the patient's history were reviewed and updated as appropriate: allergies, current medications, past family history, past medical history, past social history, past surgical history and problem list.   Health Maintenance:   Last pap     Component Value Date/Time   DIAGPAP (A) 07/12/2022 1226    - Atypical squamous cells of undetermined significance (ASC-US )   HPVHIGH Positive (A) 07/12/2022 1226   ADEQPAP  07/12/2022 1226    Satisfactory for evaluation; transformation zone component PRESENT.    COLPO (09/10/23) benign ECC w/ mild chronic cervicitis  Last mammogram: n/a   Review of Systems:  Pertinent items are noted in HPI. Comprehensive review of systems was otherwise negative.   Objective:  Physical Exam BP 129/79   Pulse 84   Ht 5\' 7"  (1.702 m)   Wt 152 lb (68.9 kg)   BMI 23.81 kg/m    Physical Exam Vitals and nursing note reviewed. Exam conducted with a chaperone present.  Constitutional:      Appearance: Normal appearance.  HENT:     Head: Normocephalic and atraumatic.  Pulmonary:     Effort: Pulmonary effort is normal.     Breath sounds: Normal breath sounds.  Genitourinary:    General: Normal vulva.     Exam position: Lithotomy position.     Vagina:  Normal.     Cervix: Normal.     Comments: Paragard  strings visualized Skin:    General: Skin is warm and dry.  Neurological:     General: No focal deficit present.     Mental Status: She is alert.  Psychiatric:        Mood and Affect: Mood normal.        Behavior: Behavior normal.        Thought Content: Thought content normal.        Judgment: Judgment normal.    IUD Removal  Patient identified, informed consent performed, consent signed.  Patient was in the dorsal lithotomy position, normal external genitalia was noted.  A speculum was placed in the patient's vagina, normal discharge was noted, no lesions. The cervix was visualized, no lesions, no abnormal discharge.  The strings of the IUD were visualized, grasped and pulled using ring forceps. The IUD was removed in its entirety. . Patient tolerated the procedure well.    IUD Insertion Procedure Note Patient identified, informed consent performed, consent signed.   Discussed risks of irregular bleeding, cramping, infection, malpositioning or misplacement of the IUD outside the uterus which may require further procedure such as laparoscopy. Also discussed >99% contraception efficacy, increased risk of ectopic pregnancy with failure of method.  Time out was performed.  Urine pregnancy test negative.  Speculum placed in the vagina.  Cervix  visualized.  Cleaned with Betadine x 2.  Anterior cervix grasped with a single tooth tenaculum.  Paracervical block was administered.  Mirena IUD placed per manufacturer's recommendations.  Strings trimmed to 3 cm. Tenaculum was removed, good hemostasis noted.  Patient tolerated procedure well.   Patient was given post-procedure instructions.  She was advised to have backup contraception for one week.  Patient was also asked to check IUD strings periodically and follow up prn for IUD check.     Assessment & Plan:   1. Encounter for IUD insertion (Primary) 2. IUD (intrauterine device) in place 3.  Encounter for IUD removal Now s/p uncomplicated IUD removal of paragard  and insertion of Mirena. Reviewed possible menstrual pattern changes that can occur with the Mirena and to monitor bleeding pattern for the next 3-6 months  4. Atypical squamous cells of undetermined significance on cytologic smear of cervix (ASC-US ) S/p colpo with benign ECC on 09/10/2023 - repeat pap in 1 year  5. Vaccine for human papilloma virus (HPV) types 6, 11, 16, and 18 administered 2 of 3 vaccination today, return in 4 months for final injection in series  Routine preventative health maintenance measures emphasized.  Kiki Pelton, MD [facility], [facility]

## 2023-12-14 ENCOUNTER — Encounter: Payer: Self-pay | Admitting: *Deleted

## 2023-12-14 ENCOUNTER — Other Ambulatory Visit: Payer: Self-pay | Admitting: Family Medicine

## 2023-12-14 DIAGNOSIS — F1121 Opioid dependence, in remission: Secondary | ICD-10-CM

## 2023-12-14 NOTE — Telephone Encounter (Signed)
 Last Fill: 11/15/23 90 tabs/0 RF  Last OV: 10/16/23 Next OV: None Scheduled  Routing to provider for review/authorization.     Copied from CRM [id number]. Topic: Clinical - Medication Question >> Dec 14, 2023 11:40 AM Retta Caster wrote: Reason for CRM: Buprenorphine  HCl-Naloxone  HCl (SUBOXONE ) 8-2 MG FILM-Patient is request ing refill but it show they are sending it thru Dr. Duncan Gibson but it should be going thur Dr. Lanetta Pion. Needs call back on how to resolve this issue for refill. Only has 2 pills left [phone]

## 2023-12-17 ENCOUNTER — Other Ambulatory Visit: Payer: Self-pay | Admitting: Internal Medicine

## 2023-12-17 DIAGNOSIS — F1121 Opioid dependence, in remission: Secondary | ICD-10-CM

## 2023-12-17 MED ORDER — BUPRENORPHINE HCL-NALOXONE HCL 8-2 MG SL FILM: 1.0000 | ORAL_FILM | Freq: Three times a day (TID) | SUBLINGUAL | 0 refills | Status: DC

## 2023-12-17 NOTE — Telephone Encounter (Signed)
 Pt called requesting medication refill on Buprenorphine  HCl-Naloxone  HCl (SUBOXONE ) 8-2 MG FILM to be filled by today. Advised pt message has already been routed to Dr. Duncan Gibson about this refilled. Advised pt medication can take 24 to 48 hours turn around time. Pt got upset and yelled on the phone demanding her refilled to be done by today, stating she is completely out of medication for 1 week. Advised patient I will send another message to the doctor regarding refilled.

## 2023-12-20 ENCOUNTER — Other Ambulatory Visit: Payer: Self-pay | Admitting: Student

## 2023-12-20 DIAGNOSIS — F1121 Opioid dependence, in remission: Secondary | ICD-10-CM

## 2024-01-15 ENCOUNTER — Other Ambulatory Visit: Payer: Self-pay

## 2024-01-15 DIAGNOSIS — F1121 Opioid dependence, in remission: Secondary | ICD-10-CM

## 2024-01-15 MED ORDER — BUPRENORPHINE HCL-NALOXONE HCL 8-2 MG SL FILM: 1.0000 | ORAL_FILM | Freq: Three times a day (TID) | SUBLINGUAL | 0 refills | Status: DC

## 2024-01-15 NOTE — Telephone Encounter (Signed)
 Patient called she is requesting a medication refill for her suboxone . Patient stated she is going out of town tomorrow and is requesting the rx to be refilled asap.

## 2024-02-15 ENCOUNTER — Other Ambulatory Visit: Payer: Self-pay | Admitting: Internal Medicine

## 2024-02-15 DIAGNOSIS — F1121 Opioid dependence, in remission: Secondary | ICD-10-CM

## 2024-02-15 NOTE — Telephone Encounter (Signed)
 Last rx written - 01/15/24. Last OV - 11/05/23. Next OV - has not been scheduled. TOX - 11/05/23.

## 2024-02-15 NOTE — Telephone Encounter (Signed)
 Copied from CRM [id number]. Topic: Clinical - Medication Refill >> Feb 15, 2024  8:53 AM Miquel SAILOR wrote: Medication: Buprenorphine  HCl-Naloxone  HCl (SUBOXONE ) 8-2 MG FILM   Has the patient contacted their pharmacy? Yes (Agent: If no, request that the patient contact the pharmacy for the refill. If patient does not wish to contact the pharmacy document the reason why and proceed with request.) (Agent: If yes, when and what did the pharmacy advise?)  This is the patient's preferred pharmacy:  [phone] [place] - [address] [place] [address] Phone: [phone] Fax: [phone]  Is this the correct pharmacy for this prescription? Yes If no, delete pharmacy and type the correct one.   Has the prescription been filled recently? Yes  Is the patient out of the medication? Yes  Has the patient been seen for an appointment in the last year OR does the patient have an upcoming appointment? Yes  Can we respond through MyChart? Yes  Agent: Please be advised that Rx refills may take up to 3 business days. We ask that you follow-up with your pharmacy.

## 2024-02-16 MED ORDER — BUPRENORPHINE HCL-NALOXONE HCL 8-2 MG SL FILM: 1.0000 | ORAL_FILM | Freq: Three times a day (TID) | SUBLINGUAL | 0 refills | Status: DC

## 2024-03-14 ENCOUNTER — Ambulatory Visit

## 2024-03-17 ENCOUNTER — Other Ambulatory Visit: Payer: Self-pay | Admitting: Student

## 2024-03-17 DIAGNOSIS — F1121 Opioid dependence, in remission: Secondary | ICD-10-CM

## 2024-03-17 NOTE — Telephone Encounter (Unsigned)
 Copied from CRM [id number]. Topic: Clinical - Medication Refill >> Mar 17, 2024  2:30 PM Carrielelia G wrote: Medication: Buprenorphine  HCl-Naloxone  HCl (SUBOXONE ) 8-2 MG FILM  Has the patient contacted their pharmacy? No (Agent: If no, request that the patient contact the pharmacy for the refill. If patient does not wish to contact the pharmacy document the reason why and proceed with request.) (Agent: If yes, when and what did the pharmacy advise?)  This is the patient's preferred pharmacy:  [phone] [place] - [address] [place] [address] Phone: [phone] Fax: [phone]  Is this the correct pharmacy for this prescription? Yes If no, delete pharmacy and type the correct one.    Is the patient out of the medication? Yes  Has the patient been seen for an appointment in the last year OR does the patient have an upcoming appointment? Yes  Can we respond through MyChart? Yes  Agent: Please be advised that Rx refills may take up to 3 business days. We ask that you follow-up with your pharmacy.

## 2024-03-18 ENCOUNTER — Ambulatory Visit

## 2024-03-18 DIAGNOSIS — Z23 Encounter for immunization: Secondary | ICD-10-CM

## 2024-03-18 NOTE — Progress Notes (Signed)
 Tonya David is here for their 3rd Gardasil injection. Pt denies any issues at this time. Injection administered in LD. Pt tolerated injection well. No follow up needed; series complete.

## 2024-03-19 ENCOUNTER — Ambulatory Visit: Payer: Self-pay

## 2024-03-19 ENCOUNTER — Telehealth: Payer: Self-pay | Admitting: *Deleted

## 2024-03-19 ENCOUNTER — Ambulatory Visit

## 2024-03-19 VITALS — BP 139/83 | HR 73 | Temp 98.3°F | Ht 67.0 in | Wt 152.2 lb

## 2024-03-19 DIAGNOSIS — G4700 Insomnia, unspecified: Secondary | ICD-10-CM | POA: Diagnosis not present

## 2024-03-19 DIAGNOSIS — F1121 Opioid dependence, in remission: Secondary | ICD-10-CM | POA: Diagnosis present

## 2024-03-19 NOTE — Telephone Encounter (Signed)
 Call to Pharmacy to check why patient did not receive the entire 90 strips.  Pharmacy correcting with patient now. Copied from CRM [id number]. Topic: Clinical - Prescription Issue >> Mar 19, 2024  3:49 PM Carrielelia G wrote: Pt Question why only 30 (Buprenorphine  HCl-Naloxone  HCl (SUBOXONE ) 8-2 MG FILM)  was dispensed when it suppose to be 90    [id number] [place] - [address] [place] [address] Phone: [phone] Fax: [phone]

## 2024-03-19 NOTE — Patient Instructions (Addendum)
 Thank you, [name] for allowing us  to provide your care today. Today we discussed sleep and her suboxone  use.     I have ordered the following labs for you:  Lab Orders         ToxAssure Select,+Antidepr,UR       I will call if any are abnormal. All of your labs can be accessed through My Chart.   My Chart Access: [email or web address]  Please follow-up in: 1 month to follow up on how your sleep is doing     We look forward to seeing you next time. Please call our clinic at [phone] if you have any questions or concerns. The best time to call is Monday-Friday from 9am-4pm, but there is someone available 24/7. If after hours or the weekend, call the main hospital number and ask for the Internal Medicine Resident On-Call. If you need medication refills, please notify your pharmacy one week in advance and they will send us  a request.   Thank you for letting us  take part in your care. Wishing you the best!  Terrel Nesheiwat, DO 03/19/2024, 11:42 AM [facility]

## 2024-03-19 NOTE — Progress Notes (Signed)
 Established Patient Office Visit  Subjective   Patient ID: Tonya David, female    DOB: [birth year]  Age: 38 y.o. MRN: [id number]  Chief Complaint  Patient presents with   Medication Refill   Tonya David is a 38 year old female with past medical history of opiate use disorder and anemia who presents today for follow-up.  Please see problem-based assessment and plan below for more details.   Review of Systems  Constitutional:  Positive for malaise/fatigue.  HENT: Negative.    Eyes: Negative.   Respiratory: Negative.    Cardiovascular: Negative.   Gastrointestinal: Negative.   Genitourinary: Negative.   Musculoskeletal: Negative.   Neurological: Negative.   Endo/Heme/Allergies: Negative.   Psychiatric/Behavioral: Negative.       Objective:    BP 139/83 (BP Location: Right Arm, Patient Position: Sitting, Cuff Size: Small)   Pulse 73   Temp 98.3 F (36.8 C) (Oral)   Ht 5' 7 (1.702 m)   Wt 152 lb 3.2 oz (69 kg)   SpO2 99%   BMI 23.84 kg/m   Physical Exam Constitutional:      Appearance: Normal appearance. She is normal weight.     Comments: Pleasant  HENT:     Mouth/Throat:     Mouth: Mucous membranes are moist.  Eyes:     Pupils: Pupils are equal, round, and reactive to light.  Cardiovascular:     Rate and Rhythm: Normal rate and regular rhythm.     Pulses: Normal pulses.     Heart sounds: Normal heart sounds.  Pulmonary:     Effort: Pulmonary effort is normal.  Abdominal:     General: Abdomen is flat.     Palpations: Abdomen is soft.  Musculoskeletal:        General: Normal range of motion.     Cervical back: Normal range of motion.  Skin:    General: Skin is warm.     Capillary Refill: Capillary refill takes less than 2 seconds.  Neurological:     General: No focal deficit present.     Mental Status: She is alert and oriented to person, place, and time.  Psychiatric:        Mood and Affect: Mood normal.        Behavior: Behavior normal.       Assessment & Plan:   Patient discussed with Dr. Mliss Pouch.  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  Last tox assure on 10/2023 appropriate for chronic buprenorphine  use but inappropriate for cyclobenzaprine  use. Previously discussed with Dr. Toma how patient had been using her friend's cyclobenzaprine  as needed at night for sleep. PDMP reviewed by Dr. Kristy Ahr on 9/10, appropriate filling history.  Will continue Suboxone . - Continue Suboxone  8-2 TID - Urine Tox Screen today   Insomnia Patient reports having troubles with both falling and staying asleep for the better part of 1 year.  She says that she will fall asleep, but then wake up almost every hour in very disrupted sleep-wake cycles.  It normally will take her anywhere between 15-20 minutes to fall back asleep, but then she will wake up again an hour later for it to repeat.  She says when she wakes up in the middle the night, she does not touch her phone and she tries to lay still and fall back asleep but keeps taking this long to do so.  She does normally watch TV most nights and will use her phone  before bed.  Discussed proper sleep hygiene, including a dark, cold, and quiet environment, as well as avoiding other stimulants such as sodas, caffeine, or excessive liquid before bedtime.  The patient does not drink much coffee, but does admit to carbonated beverages like soda, of which she is trying to reduce.  She will also nap briefly for about 30 minutes most days. She also has tried melatonin, which will help her fall asleep but does not help with staying asleep.  Discussed taking her last Suboxone  dose earlier in the evening rather than right before bed to see if this alleviates some of her symptoms.  Patient is willing to try this.  She will also limit her phone use before bed, and then eventually will reduce TV as well.  Asked patient to return in 1 month to monitor how  her sleep has been.  If not improved, can consider other nighttime, nonaddictive sleep agents.  Patient voiced understanding. - Follow-up 1 month - sleep hygiene  Tonya Herrmann, DO Internal Medicine Resident, PGY-1 9:48 AM 03/19/2024

## 2024-03-22 LAB — TOXASSURE SELECT,+ANTIDEPR,UR

## 2024-03-24 NOTE — Progress Notes (Signed)
 Internal Medicine Clinic Attending  Case discussed with the resident at the time of the visit.  We reviewed the resident's history and exam and pertinent patient test results.  I agree with the assessment, diagnosis, and plan of care documented in the resident's note.

## 2024-03-31 ENCOUNTER — Ambulatory Visit: Payer: Self-pay

## 2024-03-31 NOTE — Progress Notes (Signed)
 Tox Assure appropriate for chronic buprenorphine  use.

## 2024-04-17 ENCOUNTER — Other Ambulatory Visit: Payer: Self-pay | Admitting: Student

## 2024-04-17 ENCOUNTER — Other Ambulatory Visit: Payer: Self-pay | Admitting: *Deleted

## 2024-04-17 DIAGNOSIS — F1121 Opioid dependence, in remission: Secondary | ICD-10-CM

## 2024-04-17 MED ORDER — BUPRENORPHINE HCL-NALOXONE HCL 8-2 MG SL FILM: 8.0000 mg | ORAL_FILM | Freq: Three times a day (TID) | SUBLINGUAL | 0 refills | Status: DC

## 2024-04-17 NOTE — Telephone Encounter (Signed)
 Copied from CRM [id number]. Topic: Clinical - Prescription Issue >> Apr 17, 2024  8:42 AM Tonya David wrote: Reason for CRM: Pt Question why only 30 (Buprenorphine  HCl-Naloxone  HCl (SUBOXONE ) 8-2 MG FILM) Not showing on list needs medication refill for 90 days needs call back on update  [phone]    was dispensed when it suppose to be 90        [id number], [place] - [address] [place] [address] [place] [address]  Phone: [phone] Fax: [phone]

## 2024-04-17 NOTE — Telephone Encounter (Signed)
 Last rx written - 03/19/24. Last OV - 03/19/24. TOX - 03/19/24.

## 2024-05-16 ENCOUNTER — Other Ambulatory Visit: Payer: Self-pay

## 2024-05-16 DIAGNOSIS — F1121 Opioid dependence, in remission: Secondary | ICD-10-CM

## 2024-05-16 NOTE — Telephone Encounter (Unsigned)
 Copied from CRM [id number]. Topic: Clinical - Medication Refill >> May 16, 2024  2:23 PM Diannia H wrote: Medication: Buprenorphine  HCl-Naloxone  HCl (SUBOXONE ) 8-2 MG FILM  Has the patient contacted their pharmacy? Yes (Agent: If no, request that the patient contact the pharmacy for the refill. If patient does not wish to contact the pharmacy document the reason why and proceed with request.) (Agent: If yes, when and what did the pharmacy advise?)  This is the patient's preferred pharmacy:  [phone] [place] - [address] [place] [address] Phone: [phone] Fax: [phone]  Is this the correct pharmacy for this prescription? Yes If no, delete pharmacy and type the correct one.   Has the prescription been filled recently? Yes  Is the patient out of the medication? No  Has the patient been seen for an appointment in the last year OR does the patient have an upcoming appointment? Yes  Can we respond through MyChart? Yes  Agent: Please be advised that Rx refills may take up to 3 business days. We ask that you follow-up with your pharmacy.

## 2024-05-17 ENCOUNTER — Other Ambulatory Visit: Payer: Self-pay | Admitting: Student

## 2024-05-17 DIAGNOSIS — F1121 Opioid dependence, in remission: Secondary | ICD-10-CM

## 2024-05-29 ENCOUNTER — Ambulatory Visit (INDEPENDENT_AMBULATORY_CARE_PROVIDER_SITE_OTHER)

## 2024-05-29 VITALS — BP 148/91 | HR 78 | Wt 150.6 lb

## 2024-05-29 DIAGNOSIS — Z3200 Encounter for pregnancy test, result unknown: Secondary | ICD-10-CM | POA: Diagnosis not present

## 2024-05-29 LAB — POCT URINE PREGNANCY: Preg Test, Ur: NEGATIVE

## 2024-05-29 NOTE — Progress Notes (Signed)
..  Ms. Mcgann presents today for UPT. She reports having an IUD in place and having 2 positive UPTs at home over the weekend. Pt states that she has not really been having cycles with her IUD but she started feeling nauseous which prompted her to do the test. Pt states that she let UPTS sit for at least 10 minutes before reading results. LMP: IUD    OBJECTIVE: Appears well, in no apparent distress.  OB History     Gravida  10   Para  7   Term  2   Preterm  5   AB  3   Living  7      SAB  1   IAB  2   Ectopic  0   Multiple  0   Live Births  7          Home UPT Result: Pt reports 2 positives In-Office UPT result: Negative I have reviewed the patient's medical, obstetrical, social, and family histories, and medications.   ASSESSMENT: Negative pregnancy test  PLAN Prenatal care to be completed at: N/A Advised that home results could have shown a false positive if results were read too long after instructed time.  Advised pt to monitor symptoms and follow up if anything changes

## 2024-06-16 ENCOUNTER — Telehealth: Payer: Self-pay

## 2024-06-16 ENCOUNTER — Other Ambulatory Visit: Payer: Self-pay

## 2024-06-16 DIAGNOSIS — F1121 Opioid dependence, in remission: Secondary | ICD-10-CM

## 2024-06-16 NOTE — Telephone Encounter (Unsigned)
 Copied from CRM [id number]. Topic: Clinical - Medication Refill >> Jun 16, 2024  8:50 AM Antonio H wrote: Patient called to request medication refill for Buprenorphine  HCl-Naloxone  HCl (SUBOXONE ) 8-2 MG FILM. There is a pending request already. Let patient know of 3 business day turn around time but patient needs medication before the 3 business days. [facility] [address]

## 2024-07-15 ENCOUNTER — Other Ambulatory Visit: Payer: Self-pay

## 2024-07-15 DIAGNOSIS — F1121 Opioid dependence, in remission: Secondary | ICD-10-CM

## 2024-07-15 NOTE — Telephone Encounter (Signed)
 Copied from CRM #[id number]. Topic: Clinical - Medication Refill >> Jul 15, 2024  9:20 AM Diannia H wrote: Medication: Buprenorphine  HCl-Naloxone  HCl (SUBOXONE ) 8-2 MG FILM  Has the patient contacted their pharmacy? Yes (Agent: If no, request that the patient contact the pharmacy for the refill. If patient does not wish to contact the pharmacy document the reason why and proceed with request.) (Agent: If yes, when and what did the pharmacy advise?)  This is the patient's preferred pharmacy:  [phone] [place] - [address] [place] [address] Phone: [phone] Fax: [phone]  Is this the correct pharmacy for this prescription? Yes If no, delete pharmacy and type the correct one.   Has the prescription been filled recently? Yes  Is the patient out of the medication? No  Has the patient been seen for an appointment in the last year OR does the patient have an upcoming appointment? Yes  Can we respond through MyChart? Yes  Agent: Please be advised that Rx refills may take up to 3 business days. We ask that you follow-up with your pharmacy.

## 2024-07-17 MED ORDER — BUPRENORPHINE HCL-NALOXONE HCL 8-2 MG SL FILM: 1.0000 | ORAL_FILM | Freq: Three times a day (TID) | SUBLINGUAL | 0 refills | Status: AC
